# Patient Record
Sex: Female | Born: 1945 | Race: White | Hispanic: No | Marital: Married | Smoking: Never smoker
Health system: Southern US, Community
[De-identification: ages and names within clinical notes are randomized; demographics above are authoritative.]

## PROBLEM LIST (undated history)

## (undated) DIAGNOSIS — Z8719 Personal history of other diseases of the digestive system: Secondary | ICD-10-CM

## (undated) DIAGNOSIS — K589 Irritable bowel syndrome without diarrhea: Secondary | ICD-10-CM

## (undated) DIAGNOSIS — I10 Essential (primary) hypertension: Secondary | ICD-10-CM

## (undated) DIAGNOSIS — F329 Major depressive disorder, single episode, unspecified: Secondary | ICD-10-CM

## (undated) DIAGNOSIS — E785 Hyperlipidemia, unspecified: Secondary | ICD-10-CM

## (undated) DIAGNOSIS — M1990 Unspecified osteoarthritis, unspecified site: Secondary | ICD-10-CM

## (undated) DIAGNOSIS — N200 Calculus of kidney: Secondary | ICD-10-CM

## (undated) DIAGNOSIS — D649 Anemia, unspecified: Secondary | ICD-10-CM

## (undated) DIAGNOSIS — F419 Anxiety disorder, unspecified: Secondary | ICD-10-CM

## (undated) DIAGNOSIS — K219 Gastro-esophageal reflux disease without esophagitis: Secondary | ICD-10-CM

## (undated) DIAGNOSIS — E039 Hypothyroidism, unspecified: Secondary | ICD-10-CM

## (undated) DIAGNOSIS — Z87442 Personal history of urinary calculi: Secondary | ICD-10-CM

## (undated) DIAGNOSIS — F32A Anxiety disorder, unspecified: Secondary | ICD-10-CM

## (undated) DIAGNOSIS — M3500 Sicca syndrome, unspecified: Secondary | ICD-10-CM

## (undated) DIAGNOSIS — K5792 Diverticulitis of intestine, part unspecified, without perforation or abscess without bleeding: Secondary | ICD-10-CM

## (undated) DIAGNOSIS — M797 Fibromyalgia: Secondary | ICD-10-CM

## (undated) DIAGNOSIS — M329 Systemic lupus erythematosus, unspecified: Secondary | ICD-10-CM

## (undated) HISTORY — DX: Personal history of urinary calculi: Z87.442

## (undated) HISTORY — DX: Hypothyroidism, unspecified: E03.9

## (undated) HISTORY — DX: Calculus of kidney: N20.0

## (undated) HISTORY — PX: BREAST EXCISIONAL BIOPSY: SUR124

## (undated) HISTORY — DX: Diverticulitis of intestine, part unspecified, without perforation or abscess without bleeding: K57.92

## (undated) HISTORY — DX: Hyperlipidemia, unspecified: E78.5

## (undated) HISTORY — DX: Unspecified osteoarthritis, unspecified site: M19.90

## (undated) HISTORY — DX: Anemia, unspecified: D64.9

## (undated) HISTORY — DX: Fibromyalgia: M79.7

## (undated) HISTORY — DX: Irritable bowel syndrome, unspecified: K58.9

## (undated) HISTORY — PX: TONSILLECTOMY AND ADENOIDECTOMY: SUR1326

## (undated) HISTORY — DX: Anxiety disorder, unspecified: F41.9

## (undated) HISTORY — DX: Depression, unspecified: F32.A

## (undated) HISTORY — PX: CATARACT EXTRACTION: SUR2

## (undated) HISTORY — DX: Gastro-esophageal reflux disease without esophagitis: K21.9

## (undated) HISTORY — DX: Major depressive disorder, single episode, unspecified: F32.9

## (undated) HISTORY — DX: Systemic lupus erythematosus, unspecified: M32.9

## (undated) HISTORY — DX: Essential (primary) hypertension: I10

## (undated) HISTORY — DX: Personal history of other diseases of the digestive system: Z87.19

## (undated) HISTORY — DX: Anxiety disorder, unspecified: F32.A

## (undated) HISTORY — DX: Sjogren syndrome, unspecified: M35.00

---

## 1968-07-03 HISTORY — PX: APPENDECTOMY: SHX54

## 1986-07-03 HISTORY — PX: CHOLECYSTECTOMY: SHX55

## 1998-07-03 HISTORY — PX: TOTAL ABDOMINAL HYSTERECTOMY: SHX209

## 1999-01-26 ENCOUNTER — Ambulatory Visit (HOSPITAL_COMMUNITY): Admission: RE | Admit: 1999-01-26 | Discharge: 1999-01-26 | Payer: Self-pay | Admitting: Family Medicine

## 1999-01-26 ENCOUNTER — Encounter: Payer: Self-pay | Admitting: Family Medicine

## 1999-04-25 ENCOUNTER — Encounter (INDEPENDENT_AMBULATORY_CARE_PROVIDER_SITE_OTHER): Payer: Self-pay

## 1999-04-25 ENCOUNTER — Inpatient Hospital Stay (HOSPITAL_COMMUNITY): Admission: RE | Admit: 1999-04-25 | Discharge: 1999-04-27 | Payer: Self-pay | Admitting: Obstetrics and Gynecology

## 1999-06-04 ENCOUNTER — Encounter: Payer: Self-pay | Admitting: Emergency Medicine

## 1999-06-04 ENCOUNTER — Emergency Department (HOSPITAL_COMMUNITY): Admission: EM | Admit: 1999-06-04 | Discharge: 1999-06-04 | Payer: Self-pay | Admitting: Emergency Medicine

## 1999-07-04 DIAGNOSIS — M329 Systemic lupus erythematosus, unspecified: Secondary | ICD-10-CM

## 1999-07-04 HISTORY — DX: Systemic lupus erythematosus, unspecified: M32.9

## 2000-03-15 ENCOUNTER — Emergency Department (HOSPITAL_COMMUNITY): Admission: EM | Admit: 2000-03-15 | Discharge: 2000-03-15 | Payer: Self-pay | Admitting: Emergency Medicine

## 2000-03-22 ENCOUNTER — Encounter: Admission: RE | Admit: 2000-03-22 | Discharge: 2000-03-22 | Payer: Self-pay | Admitting: Obstetrics and Gynecology

## 2000-03-22 ENCOUNTER — Encounter: Payer: Self-pay | Admitting: Obstetrics and Gynecology

## 2001-03-28 ENCOUNTER — Encounter: Payer: Self-pay | Admitting: Obstetrics and Gynecology

## 2001-03-28 ENCOUNTER — Encounter: Admission: RE | Admit: 2001-03-28 | Discharge: 2001-03-28 | Payer: Self-pay | Admitting: Obstetrics and Gynecology

## 2002-03-31 ENCOUNTER — Encounter: Payer: Self-pay | Admitting: Obstetrics and Gynecology

## 2002-03-31 ENCOUNTER — Encounter: Admission: RE | Admit: 2002-03-31 | Discharge: 2002-03-31 | Payer: Self-pay | Admitting: Obstetrics and Gynecology

## 2002-09-20 ENCOUNTER — Emergency Department (HOSPITAL_COMMUNITY): Admission: EM | Admit: 2002-09-20 | Discharge: 2002-09-20 | Payer: Self-pay | Admitting: Emergency Medicine

## 2003-04-07 ENCOUNTER — Encounter: Payer: Self-pay | Admitting: Obstetrics and Gynecology

## 2003-04-07 ENCOUNTER — Encounter: Admission: RE | Admit: 2003-04-07 | Discharge: 2003-04-07 | Payer: Self-pay | Admitting: Obstetrics and Gynecology

## 2003-11-14 ENCOUNTER — Emergency Department (HOSPITAL_COMMUNITY): Admission: EM | Admit: 2003-11-14 | Discharge: 2003-11-14 | Payer: Self-pay

## 2003-11-14 IMAGING — CR DG ABDOMEN ACUTE W/ 1V CHEST
3 series · 3 of 3 positions shown · non-contrast
Comparison: Acute abdomen series ? [DATE].

CLINICAL DATA: Left-sided abdominal pain, nausea/vomiting. 
 ACUTE ABDOMEN SERIES (TWO VIEW ABDOMEN WITH ONE VIEW CHEST) ? [DATE]

[view not recorded (1 of 3)]
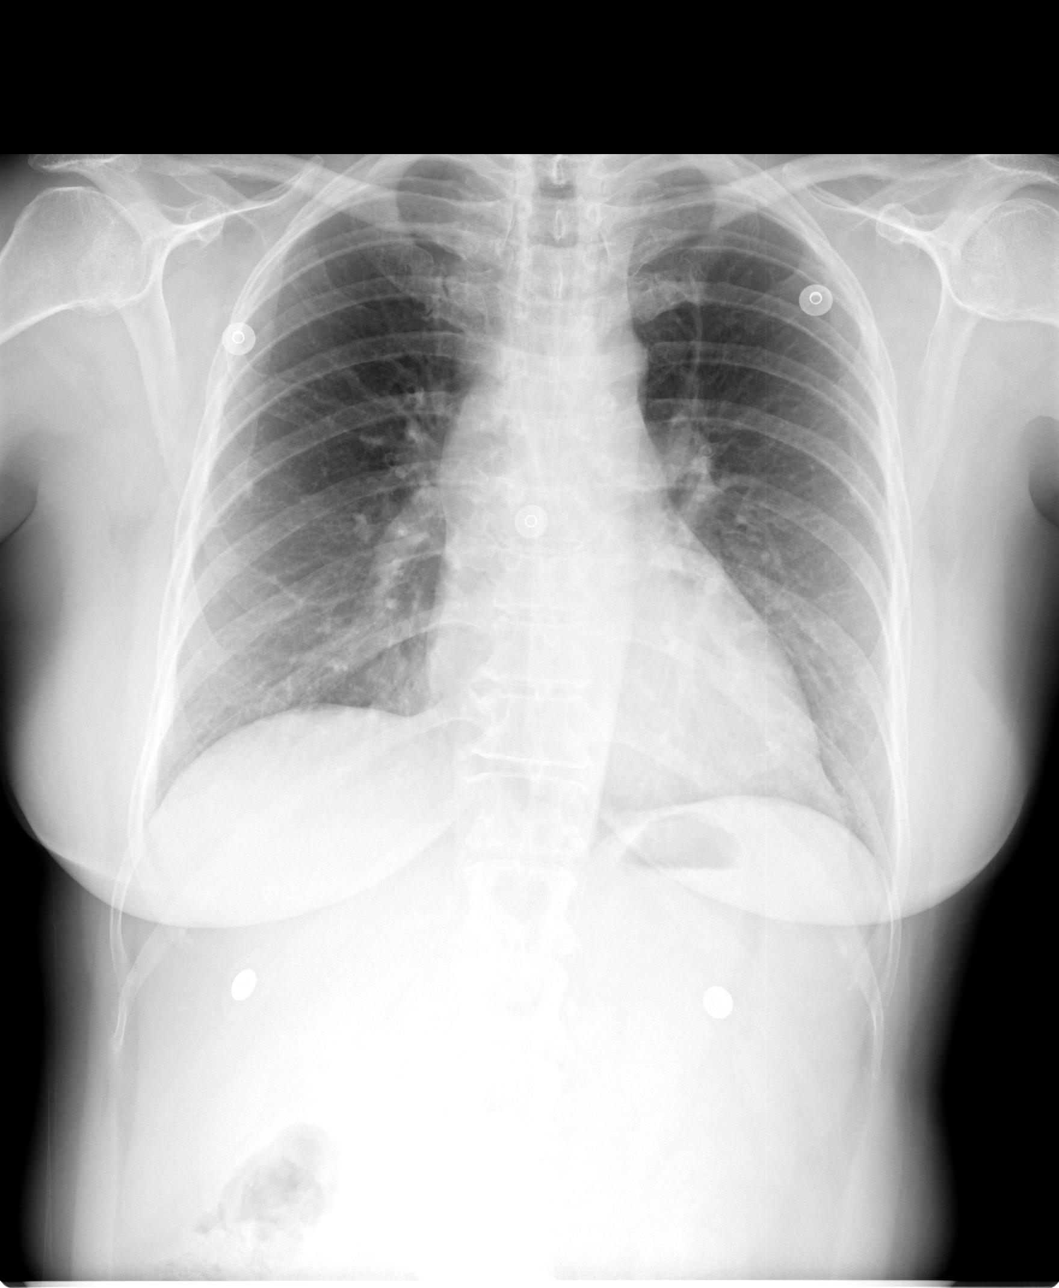

[view not recorded (2 of 3)]
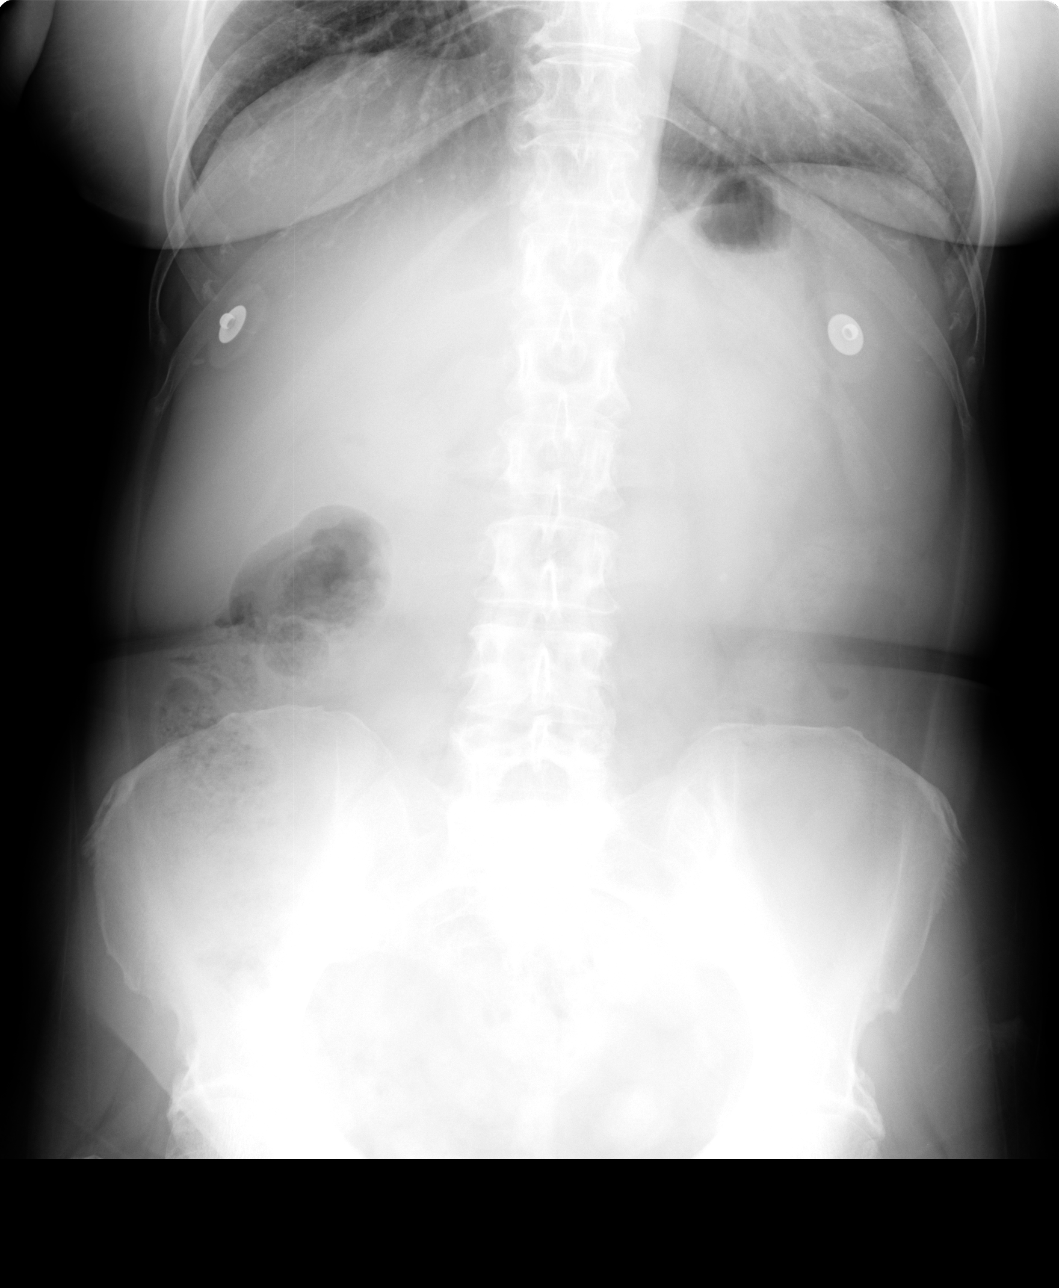

[view not recorded (3 of 3)]
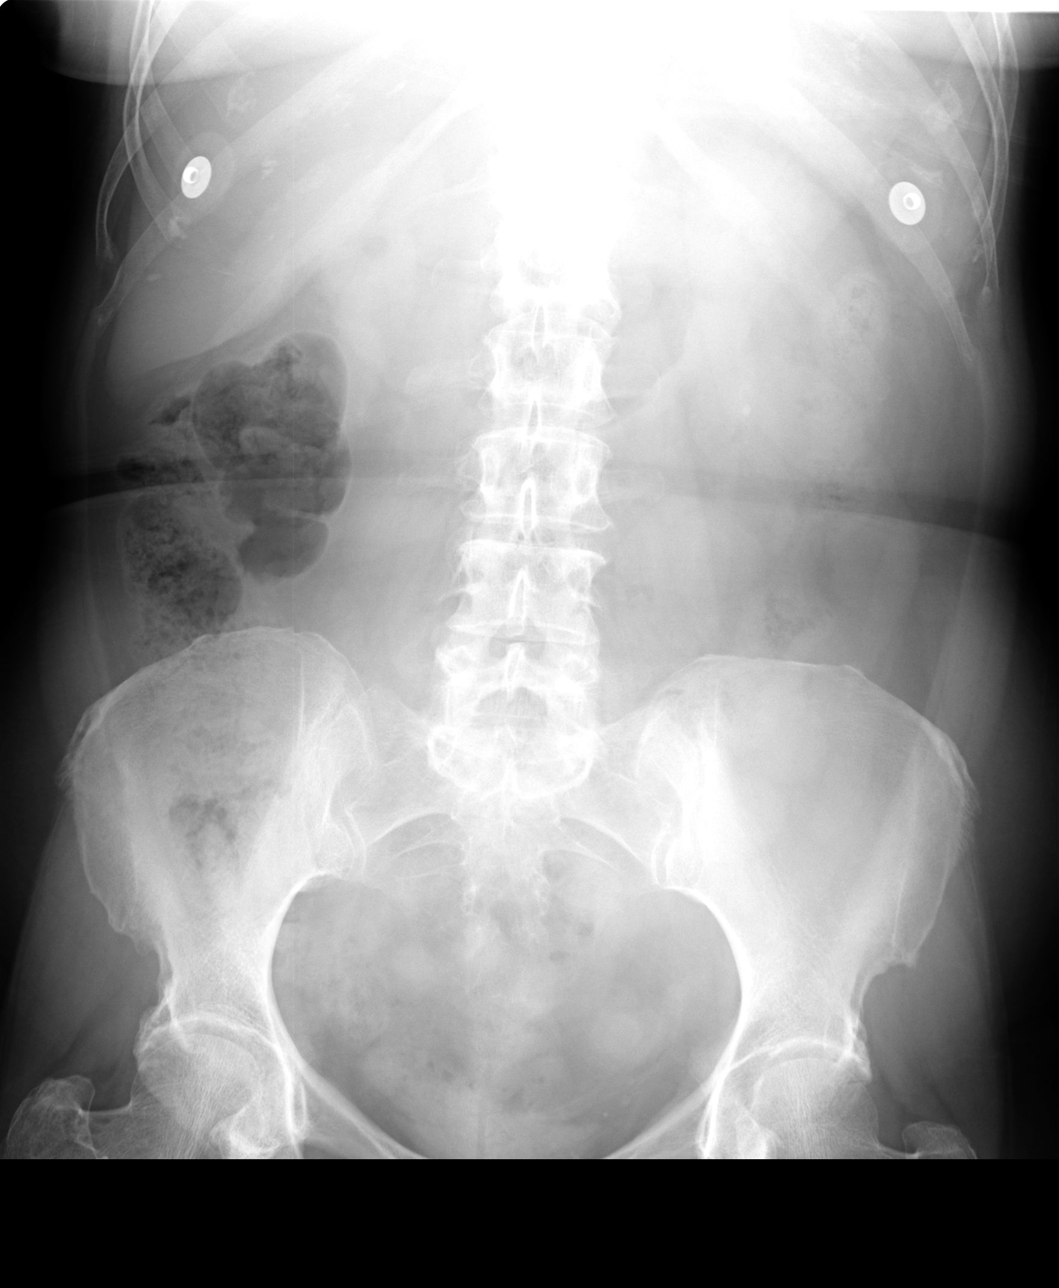

[3 of 3 positions shown; findings below may reference images not displayed]

The bowel gas pattern is unremarkable and there is no evidence of obstruction or significant ileus.  There is no evidence of free air on the erect view.  There is calcification which projects over the lower pole of the left kidney, not seen on the previous examination.  Phleboliths are again noted in the pelvis.  Mild degenerative changes are noted in the lower lumbar spine.  
 The accompanying PA chest demonstrates a normal heart size.  Thoracic aorta is mildly tortuous though unchanged.  The lungs appear clear. 
 IMPRESSION
 Query left lower pole renal calculus. 
 No acute abdominal abnormalities.  
 No acute cardiopulmonary disease.

## 2004-05-03 ENCOUNTER — Encounter: Admission: RE | Admit: 2004-05-03 | Discharge: 2004-05-03 | Payer: Self-pay | Admitting: Nephrology

## 2004-08-20 ENCOUNTER — Inpatient Hospital Stay (HOSPITAL_COMMUNITY): Admission: EM | Admit: 2004-08-20 | Discharge: 2004-08-22 | Payer: Self-pay

## 2005-03-01 ENCOUNTER — Encounter: Payer: Self-pay | Admitting: Pulmonary Disease

## 2005-03-02 ENCOUNTER — Ambulatory Visit (HOSPITAL_COMMUNITY): Admission: RE | Admit: 2005-03-02 | Discharge: 2005-03-02 | Payer: Self-pay | Admitting: Cardiology

## 2005-03-02 ENCOUNTER — Encounter: Payer: Self-pay | Admitting: Cardiology

## 2005-10-19 ENCOUNTER — Encounter: Admission: RE | Admit: 2005-10-19 | Discharge: 2005-10-19 | Payer: Self-pay | Admitting: Internal Medicine

## 2006-06-14 ENCOUNTER — Emergency Department (HOSPITAL_COMMUNITY): Admission: EM | Admit: 2006-06-14 | Discharge: 2006-06-14 | Payer: Self-pay | Admitting: Family Medicine

## 2006-11-12 ENCOUNTER — Encounter: Admission: RE | Admit: 2006-11-12 | Discharge: 2006-11-12 | Payer: Self-pay | Admitting: Internal Medicine

## 2007-11-13 ENCOUNTER — Encounter: Payer: Self-pay | Admitting: Pulmonary Disease

## 2007-12-13 ENCOUNTER — Ambulatory Visit: Payer: Self-pay | Admitting: Pulmonary Disease

## 2007-12-13 DIAGNOSIS — E0590 Thyrotoxicosis, unspecified without thyrotoxic crisis or storm: Secondary | ICD-10-CM | POA: Insufficient documentation

## 2007-12-13 DIAGNOSIS — J309 Allergic rhinitis, unspecified: Secondary | ICD-10-CM | POA: Insufficient documentation

## 2007-12-13 DIAGNOSIS — E7800 Pure hypercholesterolemia, unspecified: Secondary | ICD-10-CM | POA: Insufficient documentation

## 2007-12-13 DIAGNOSIS — R0602 Shortness of breath: Secondary | ICD-10-CM

## 2007-12-13 DIAGNOSIS — I10 Essential (primary) hypertension: Secondary | ICD-10-CM | POA: Insufficient documentation

## 2007-12-13 DIAGNOSIS — L930 Discoid lupus erythematosus: Secondary | ICD-10-CM

## 2008-01-16 ENCOUNTER — Ambulatory Visit: Payer: Self-pay | Admitting: Pulmonary Disease

## 2008-01-20 ENCOUNTER — Telehealth (INDEPENDENT_AMBULATORY_CARE_PROVIDER_SITE_OTHER): Payer: Self-pay | Admitting: *Deleted

## 2008-01-23 ENCOUNTER — Encounter: Admission: RE | Admit: 2008-01-23 | Discharge: 2008-01-23 | Payer: Self-pay | Admitting: Internal Medicine

## 2008-01-30 ENCOUNTER — Encounter: Admission: RE | Admit: 2008-01-30 | Discharge: 2008-01-30 | Payer: Self-pay | Admitting: Internal Medicine

## 2009-01-15 ENCOUNTER — Encounter (INDEPENDENT_AMBULATORY_CARE_PROVIDER_SITE_OTHER): Payer: Self-pay | Admitting: *Deleted

## 2009-08-27 ENCOUNTER — Telehealth: Payer: Self-pay | Admitting: Internal Medicine

## 2009-10-28 ENCOUNTER — Encounter: Admission: RE | Admit: 2009-10-28 | Discharge: 2009-10-28 | Payer: Self-pay | Admitting: Internal Medicine

## 2010-02-22 ENCOUNTER — Encounter
Admission: RE | Admit: 2010-02-22 | Discharge: 2010-02-22 | Payer: Self-pay | Source: Home / Self Care | Admitting: Internal Medicine

## 2010-07-23 ENCOUNTER — Other Ambulatory Visit: Payer: Self-pay | Admitting: Internal Medicine

## 2010-07-23 DIAGNOSIS — E041 Nontoxic single thyroid nodule: Secondary | ICD-10-CM

## 2010-08-02 ENCOUNTER — Ambulatory Visit (HOSPITAL_COMMUNITY)
Admission: RE | Admit: 2010-08-02 | Discharge: 2010-08-02 | Payer: Self-pay | Source: Home / Self Care | Attending: Internal Medicine | Admitting: Internal Medicine

## 2010-08-04 NOTE — Progress Notes (Signed)
Summary: Schedule Colonoscopy  Phone Note Outgoing Call Call back at Home Phone [phone]   Call placed by: Harlow Mares CMA Duncan Dull),  August 27, 2009 10:30 AM Call placed to: Patient Summary of Call: patient states that she knows that she is due for a colonoscopy but she was so much nasuea and vomiting from the prep the last time she does not wish to have the colonoscopy at this time but she will think about it and call back. I advised her that she would be able to do another prep and offered her an office visit to discuss her concerns with Dr. Marina Goodell. She declined.  Initial call taken by: Harlow Mares CMA Duncan Dull),  August 27, 2009 10:34 AM

## 2010-08-30 ENCOUNTER — Ambulatory Visit
Admission: RE | Admit: 2010-08-30 | Discharge: 2010-08-30 | Disposition: A | Discharge status: Home / Self Care | Source: Ambulatory Visit | Attending: Internal Medicine | Admitting: Internal Medicine

## 2010-08-30 DIAGNOSIS — E041 Nontoxic single thyroid nodule: Secondary | ICD-10-CM

## 2010-09-15 ENCOUNTER — Other Ambulatory Visit: Payer: Self-pay | Admitting: Internal Medicine

## 2010-09-15 DIAGNOSIS — Z1231 Encounter for screening mammogram for malignant neoplasm of breast: Secondary | ICD-10-CM

## 2010-09-23 ENCOUNTER — Ambulatory Visit

## 2010-10-04 ENCOUNTER — Ambulatory Visit
Admission: RE | Admit: 2010-10-04 | Discharge: 2010-10-04 | Disposition: A | Discharge status: Home / Self Care | Source: Ambulatory Visit | Attending: Internal Medicine | Admitting: Internal Medicine

## 2010-10-04 DIAGNOSIS — Z1231 Encounter for screening mammogram for malignant neoplasm of breast: Secondary | ICD-10-CM

## 2010-11-18 NOTE — Discharge Summary (Signed)
NAMEPAYTAN, RECINE               ACCOUNT NO.:  [id number]   MEDICAL RECORD NO.:  [id number]          PATIENT TYPE:  INP   LOCATION:  5506                         FACILITY:  [facility]   PHYSICIAN:  Kari Baars, M.D.  DATE OF BIRTH:  06/26/1946   DATE OF ADMISSION:  08/20/2004  DATE OF DISCHARGE:                                 DISCHARGE SUMMARY   DISCHARGE DIAGNOSES:  1.  Acute bronchitis/upper respiratory infection.  2.  Mild dehydration secondary to a #1.  3.  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  4.  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  5.  Hypertension.  6.  Iritis left side.  7.  Osteoporosis.  8.  Depression.  9.  Allergic rhinitis.  10. Status post cesarean section.  11. Status post cholecystectomy.  12. Status post appendectomy.  13. Status post cataract removal.  14. Status post tonsillectomy.  15. Glucose intolerance.   HOSPITAL PROCEDURES:  Chest x-ray (August 20, 2004) showing mild  peribronchial thickening with heart size upper limits of normal. No other  acute findings.   DISCHARGE MEDICATIONS:  1.  Avelox 400 mg daily be completed a seven-day course.  2.  Prednisone taper 20 mg daily x1 day, then 10 mg daily x2 days, then 5 mg      daily maintenance days until she follows up with Dr. Corliss Skains.  3.  Mucinex DM as needed for cough.  4.  Sudafed as needed for congestion.  5.  Actonel 35 mg weekly.  6.  Lexapro 10 mg daily.  7.  Zyrtec 10 mg daily.  8.  Imodium p.r.n. diarrhea.  9.  She was instructed to hold her Benicar until she has an office visit for      blood pressure follow-up.  10. Advair 100/50 b.i.d. .   HISTORY OF PRESENT ILLNESS:  For full details please see dictated history  and physical by Dr. Creola Corn.  Briefly, [name] is a 65 year old white  female with history of systemic lupus on prednisone who I saw in office  two  days prior to admission with symptoms consistent with acute bronchitis and  upper respiratory infection. Chest x-ray at that time showed no acute  cardiopulmonary disease. She was a bit tachycardiac. Therefore, she was  started on broad-spectrum antibiotics with Avelox as well as supportive  therapy with Advair, Mucinex, and Sudafed. She initially felt better but  over the next one to two days developed difficulty maintaining p.o. intake  with loss of appetite and increasing fatigue. She felt near syncopal and  presented to the emergency department where her  blood pressure was 77/40.  She was seen and admitted by my partner, Dr. Creola Corn for further  management.   HOSPITAL COURSE:  The patient was admitted to a medical bed. She was  continued on her broad-spectrum antibiotics. She was aggressively hydrated.  She was given stress dose steroids due to her hypotension in the setting of  chronic steroid  use. The urine and blood cultures were obtained and were  negative. Fortunately, she only had a mild increase in her baseline BUN and  creatinine to 22 and 1.4 on presentation. With hydration this improved to  her creatinine improved to 1.1 prior to discharge. Her steroids were weaned  on hospital day #2, down to p.o. prednisone which she has continued to  tolerate well. She has not had any further hypotension. Her tachycardia has  resolved. She is feeling better. At this time she is deemed stable for  discharge home with continued prednisone taper for relative adrenal  insufficiency in the setting of her acute bronchitis and upper respiratory  infection. She will continue her supportive therapy. She has had some  intermittent diarrhea since starting antibiotics. She has been encouraged to  maintain a large fluid intake. Of note, she does have glucose intolerance  and her sugar was up to 209 during this hospitalization after eating some  cake. She has already been instructed on glucose  monitoring and this will be  continued to be addressed as an outpatient.   DISCHARGE LABS:  Blood cultures were negative. CBC on August 21, 2004, was  significant for white count of 3.3, hemoglobin 10.4, platelets 165. CMP was  significant for a BUN of 16, creatinine 1.1. Her albumin is 3.2. Liver  function tests were normal. Her glucose was 151.   DISCHARGE DIET:  Increased fluids with recommendations for Diet 7-Up, Sprite  or water. Diabetic diet.   HOSPITAL FOLLOW-UP:  She will follow up with Dr. Clelia Croft on Thursday or Friday  for a blood pressure check. At that time we may restart her Benicar. She has  an appointment already with Dr. Corliss Skains this week to discuss tapering her  prednisone further.   DISPOSITION:  To home with her husband.      WS/MEDQ  D:  08/22/2004  T:  08/22/2004  Job:  [id number]   cc:   Pollyann Savoy, M.D.  [address]  [place], [place] [address]  Fax: [phone]   Terrial Rhodes, M.D.  [address]  [place]  [place] [address]  Fax: [phone]

## 2010-11-18 NOTE — H&P (Signed)
Veronica Jordan, Veronica Jordan               ACCOUNT NO.:  [id number]   MEDICAL RECORD NO.:  [id number]          PATIENT TYPE:  EMS   LOCATION:  MAJO                         FACILITY:  [facility]   PHYSICIAN:  Gwen Pounds, MD       DATE OF BIRTH:  02/23/1946   DATE OF ADMISSION:  08/20/2004  DATE OF DISCHARGE:                                HISTORY & PHYSICAL   PRIMARY CARE PHYSICIAN:  Dr. Clelia Croft   RHEUMATOLOGIST:  Grandville Silos. Corliss Skains, M.D.   NEPHROLOGIST:  Terrial Rhodes, M.D.   CHIEF COMPLAINT:  Hypertension, failure to thrive.   HISTORY OF PRESENT ILLNESS:  A 65 year old female with systemic lupus,  recently off her Plaquenil, and is started back on low dose prednisone per  Dr. Fatima Sanger recommendation. She has been on prednisone since November  and she has been pretty much on this drug for many years and had a short  term off. She saw Dr. Clelia Croft on Thursday with cough, congestion, no wheezing,  but short of breath, and was diagnosed with an upper respiratory tract  infection.  I presume because of her chronic poor health was put on  antibiotics prophylactically. She was treated with Avelox, Advair, Mucinex,  and Sudafed. She took her medications Thursday and felt better, but over the  next one or two days she has become progressively worse in terms of  anorexia, decreasing p.o., decreasing fluids, worsening dehydration,  increase of a weird feeling that she describes, increasing weakness,  increased fatigue, and tired. Some presyncopal type feelings. They called me  this afternoon and I told them to come on to the emergency room. In the ED  she was found to have a blood pressure of 77/40 and will be requiring  admission.   PAST MEDICAL HISTORY:  1.  Systemic lupus.  2.  Osteoporosis.  3.  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 Arrie Aran.  4.  Hypertension.  5.  Anxiety, depression.  6.  History of left eye iritis.  7.  History of  chronic prednisone use.  8.  Allergic rhinitis.  9.  Total abdominal hysterectomy.  10. Bilateral salpingo-oophorectomy.  11. Two Cesarean sections.  12. Tonsillectomy and adenoidectomy.  13. Cholecystectomy.  14. Appendectomy.  15. Two cataract surgeries.  16. History of negative breast biopsy.   ALLERGIES:  PENICILLIN, SULFA, E-MYCIN, DEMEROL, and ORAL CONTRAST.   MEDICATIONS:  1.  Prednisone 5 mg daily.  2.  Benicar 20 daily.  3.  Zyrtec 10 q.a.m.  4.  She is currently off Plaquenil for the last two or three weeks.  5.  She is on Actonel 35 weekly.  6.  Lexapro 10 daily.   SOCIAL HISTORY:  She is married with three children; unfortunately two  passed away, one at birth and one of a motor vehicle accident. No tobacco or  alcohol. She works at [employer].   FAMILY HISTORY:  Mother is alive at 65 and has COPD. Father died of  congestive heart failure and some complications of gallbladder surgery.  REVIEW OF SYSTEMS:  Her cough is better. History of present illness has  multiple review of systems and she is currently not nauseated or vomiting,  and all other review of systems are negative.   PHYSICAL EXAMINATION:  VITAL SIGNS: Blood pressure between 77/40 and 96/56,  heart rate 83, respiratory rate 13, saturation fine.  GENERAL: Alert and oriented times three, appears ill.  HEENT: PERRL. EOM. Oropharynx is dry.  NECK: No JVD.  PULMONARY: Clear to auscultation bilaterally.  CARDIAC: Regular.  ABDOMEN: Soft.  EXTREMITIES: No edema, but she is weak, and able to move all fours.   ASSESSMENT:  This is a hypertensive, weak, failure to thrive, dehydrated  female with an underlying diagnosis of systemic lupus on steroids who is  probably adrenally insufficient as well.   PLAN:  1.  Admit to telemetry for close evaluation and with hypotension.  2.  Aggressive IV fluids.  3.  Follow up on BUN and creatinine, and follow for renal insufficiency.  4.  Give stress dose steroids.   5.  Finish treatment for upper respiratory tract infection.  6.  Check chest x-ray.  7.  Check bloodwork.  8.  Check CBGs on higher dose steroids to see if she needs insulin sliding      scale.  9.  Will follow labs.  10. If the above treatment do not get her better or do not get  her straight      back, may need to do physical and occupational therapy.     JMR/MEDQ  D:  08/20/2004  T:  08/21/2004  Job:  [id number]

## 2010-12-23 ENCOUNTER — Ambulatory Visit: Admitting: Internal Medicine

## 2011-01-27 ENCOUNTER — Encounter: Payer: Self-pay | Admitting: *Deleted

## 2011-01-30 ENCOUNTER — Ambulatory Visit: Admitting: Internal Medicine

## 2011-01-31 ENCOUNTER — Encounter: Payer: Self-pay | Admitting: Internal Medicine

## 2011-02-06 ENCOUNTER — Encounter: Payer: Self-pay | Admitting: Internal Medicine

## 2011-03-07 ENCOUNTER — Encounter: Payer: Self-pay | Admitting: Internal Medicine

## 2011-03-07 ENCOUNTER — Ambulatory Visit (INDEPENDENT_AMBULATORY_CARE_PROVIDER_SITE_OTHER): Admitting: Internal Medicine

## 2011-03-07 VITALS — BP 108/52 | HR 84 | Ht 58.25 in | Wt 136.0 lb

## 2011-03-07 DIAGNOSIS — Z1211 Encounter for screening for malignant neoplasm of colon: Secondary | ICD-10-CM

## 2011-03-07 DIAGNOSIS — R198 Other specified symptoms and signs involving the digestive system and abdomen: Secondary | ICD-10-CM

## 2011-03-07 DIAGNOSIS — R110 Nausea: Secondary | ICD-10-CM

## 2011-03-07 DIAGNOSIS — K589 Irritable bowel syndrome without diarrhea: Secondary | ICD-10-CM

## 2011-03-07 MED ORDER — PEG-KCL-NACL-NASULF-NA ASC-C 100 G PO SOLR: 1.0000 | Freq: Once | ORAL | Status: DC

## 2011-03-07 NOTE — Patient Instructions (Signed)
Colon [facility] 03/17/11 8:30 am Moviprep sent to your pharmacy Colon brochure given for you to review. Reglan for nausea has been sent to pharmacy take 1 20 minutes prior to drinking prep.

## 2011-03-07 NOTE — Progress Notes (Signed)
HISTORY OF PRESENT ILLNESS:  Veronica Jordan is a 65 y.o. female with multiple medical problems as listed below. She presents today regarding followup screening colonoscopy. She was last seen in 2005 she was evaluated for rectal bleeding and chronic abdominal complaints consistent with irritable bowel syndrome. Colonoscopy and upper endoscopy were performed. Colonoscopy revealed mild diverticulosis. The examination was otherwise normal including intubation of the ileum. Random colon biopsies were normal. Upper endoscopy was normal, including duodenal biopsies to rule out sprue. She is now due for routine followup screening. She continues with chronic abdominal complaints as manifested by belching, bloating, gas, and intermittent postprandial urgency. No bleeding. Some GERD symptoms. She tells me that she had difficulties with her last colonoscopy prep, which was MiraLax. No interval family history of colon cancer. She also requires about celiac disease. Again, she had negative duodenal biopsies previously. REVIEW OF SYSTEMS:  All non-GI ROS negative except for sinus and allergy trouble, anxiety, osteoarthritis, back pain, optic neuropathy, depression, fatigue, shortness of breath, ankle edema.  Past Medical History  Diagnosis Date  . Allergic rhinitis   . Diabetes mellitus   . Hyperlipemia   . Hypertension   . Systemic lupus 2001  . GERD (gastroesophageal reflux disease)   . Osteoporosis   . Hypothyroidism   . Sjogren's syndrome   . Anxiety and depression   . Anemia   . Osteoarthritis   . Fibromyalgia   . History of gallstones   . IBS (irritable bowel syndrome)   . History of kidney stones     Past Surgical History  Procedure Date  . Total abdominal hysterectomy 2000  . Cholecystectomy 1988  . Breast biopsy   . Appendectomy 1970  . Cesarean section 1976, 1977    x2  . Cataract extraction     bilateral    Social History MELAYA HOSELTON  reports that she has never smoked. She  has never used smokeless tobacco. She reports that she does not drink alcohol or use illicit drugs.  family history includes Asthma in her mother and sister; COPD in her mother; Heart disease in her father; and Parkinsonism in an unspecified family member.  There is no history of Colon cancer.  Allergies  Allergen Reactions  . Erythromycin   . Meperidine Hcl   . Ondansetron Hcl   . Penicillins   . Sulfonamide Derivatives        PHYSICAL EXAMINATION: Vital signs: BP 108/52  Pulse 84  Ht 4' 10.25" (1.48 m)  Wt 136 lb (61.689 kg)  BMI 28.18 kg/m2  Constitutional: generally well-appearing, no acute distress Psychiatric: alert and oriented x3, cooperative Eyes: extraocular movements intact, anicteric, conjunctiva pink Mouth: oral pharynx moist, no lesions Neck: supple no lymphadenopathy Cardiovascular: heart regular rate and rhythm, no murmur Lungs: clear to auscultation bilaterally Abdomen: soft, nontender, nondistended, no obvious ascites, no peritoneal signs, normal bowel sounds, no organomegaly Rectal: Deferred until colonoscopy Extremities: no lower extremity edema bilaterally Skin: no lesions on visible extremities Neuro: No focal deficits.   ASSESSMENT:  #1. Screening colonoscopy. Appropriate candidate without contraindication.The nature of the procedure, as well as the risks, benefits, and alternatives were carefully and thoroughly reviewed with the patient. Ample time for discussion and questions allowed. The patient understood, was satisfied, and agreed to proceed.  #2. Movi prep prescribed. The patient instructed to use hard candy while consuming the prep to improve tolerance #3. Metoclopramide 10 mg 20 minutes before starting each prep session to help with nausea #4. IBS. Ongoing

## 2011-03-17 ENCOUNTER — Encounter: Payer: Self-pay | Admitting: Internal Medicine

## 2011-03-17 ENCOUNTER — Ambulatory Visit (AMBULATORY_SURGERY_CENTER): Admitting: Internal Medicine

## 2011-03-17 DIAGNOSIS — R198 Other specified symptoms and signs involving the digestive system and abdomen: Secondary | ICD-10-CM

## 2011-03-17 DIAGNOSIS — R110 Nausea: Secondary | ICD-10-CM

## 2011-03-17 DIAGNOSIS — K589 Irritable bowel syndrome without diarrhea: Secondary | ICD-10-CM

## 2011-03-17 DIAGNOSIS — Z1211 Encounter for screening for malignant neoplasm of colon: Secondary | ICD-10-CM

## 2011-03-17 MED ORDER — SODIUM CHLORIDE 0.9 % IV SOLN: 500.0000 mL | INTRAVENOUS | Status: DC

## 2011-03-17 NOTE — Patient Instructions (Signed)
Please read the handouts given to you by your recovery room nurse.    Increase the fiber in your diet due to your diverticulosis.   Resume your routine medications today.  IF you have any questions or concerns, please call us at [phone].   Thank-you cor choosing [place] today for your healthcare needs.

## 2011-03-20 ENCOUNTER — Telehealth: Payer: Self-pay | Admitting: *Deleted

## 2011-03-20 NOTE — Telephone Encounter (Signed)

## 2011-08-09 ENCOUNTER — Encounter (HOSPITAL_COMMUNITY): Payer: Self-pay

## 2011-08-09 ENCOUNTER — Encounter (HOSPITAL_COMMUNITY)
Admission: RE | Admit: 2011-08-09 | Discharge: 2011-08-09 | Disposition: A | Discharge status: Home / Self Care | Source: Ambulatory Visit | Attending: Internal Medicine | Admitting: Internal Medicine

## 2011-08-09 DIAGNOSIS — M810 Age-related osteoporosis without current pathological fracture: Secondary | ICD-10-CM | POA: Insufficient documentation

## 2011-08-09 MED ORDER — ZOLEDRONIC ACID 5 MG/100ML IV SOLN
5.0000 mg | Freq: Once | INTRAVENOUS | Status: AC
  Administered 2011-08-09: 5 mg via INTRAVENOUS
  Filled 2011-08-09: qty 100

## 2011-08-09 MED ORDER — SODIUM CHLORIDE 0.9 % IV SOLN
INTRAVENOUS | Status: DC
  Administered 2011-08-09: 16:00:00 via INTRAVENOUS

## 2011-09-19 ENCOUNTER — Other Ambulatory Visit: Payer: Self-pay | Admitting: *Deleted

## 2011-09-19 ENCOUNTER — Other Ambulatory Visit: Payer: Self-pay | Admitting: Internal Medicine

## 2011-09-19 DIAGNOSIS — Z1231 Encounter for screening mammogram for malignant neoplasm of breast: Secondary | ICD-10-CM

## 2011-10-20 ENCOUNTER — Ambulatory Visit

## 2011-11-09 ENCOUNTER — Ambulatory Visit
Admission: RE | Admit: 2011-11-09 | Discharge: 2011-11-09 | Disposition: A | Discharge status: Home / Self Care | Source: Ambulatory Visit | Attending: Internal Medicine | Admitting: Internal Medicine

## 2011-11-09 DIAGNOSIS — Z1231 Encounter for screening mammogram for malignant neoplasm of breast: Secondary | ICD-10-CM

## 2012-01-13 ENCOUNTER — Emergency Department (INDEPENDENT_AMBULATORY_CARE_PROVIDER_SITE_OTHER)
Admission: EM | Admit: 2012-01-13 | Discharge: 2012-01-13 | Disposition: A | Discharge status: Home / Self Care | Source: Home / Self Care | Attending: Emergency Medicine | Admitting: Emergency Medicine

## 2012-01-13 ENCOUNTER — Encounter (HOSPITAL_COMMUNITY): Payer: Self-pay

## 2012-01-13 DIAGNOSIS — L259 Unspecified contact dermatitis, unspecified cause: Secondary | ICD-10-CM

## 2012-01-13 DIAGNOSIS — L309 Dermatitis, unspecified: Secondary | ICD-10-CM

## 2012-01-13 DIAGNOSIS — K130 Diseases of lips: Secondary | ICD-10-CM

## 2012-01-13 MED ORDER — BACITRACIN-PRAMOXINE HCL 500-10 UNIT-MG/GM EX OINT: 1.0000 "application " | TOPICAL_OINTMENT | Freq: Two times a day (BID) | CUTANEOUS | Status: DC

## 2012-01-13 NOTE — ED Notes (Signed)
Pt states she has problems with dry lips and mouth and her lower lip has a small spot that is bleeding, started two hrs ago.

## 2012-01-13 NOTE — ED Provider Notes (Signed)
History     CSN: [id number]  Arrival date & time 01/13/12  1807   First MD Initiated Contact with Patient 01/13/12 1824      Chief Complaint  Patient presents with  . Bleeding/Bruising    (Consider location/radiation/quality/duration/timing/severity/associated sxs/prior treatment) HPI Comments: Patient reports history of cracked, dry lips that she has a habit of licking them, and peeling off the dead skin. States she peeled off some dead skin earlier today, and had some with bleeding that did not stop for several hours. Applied direct pressure with eventual hemostasis. States she tries using lip balm for her dry lips, but has not been using any recently. Is taking a baby aspirin once daily, is not on any other antiplatelet agents or anticoagulants. Denies nausea, vomiting, fevers, other rash. Has a history of Sjoren's syndrome, lupus. She was on chronic steroids, but has stopped taking in the past several days.  ROS as noted in HPI. All other ROS negative.   Patient is a 66 y.o. female presenting with mouth sores. The history is provided by the patient. No language interpreter was used.  Mouth Lesions  The current episode started today. The onset was sudden. Associated symptoms include mouth sores.    Past Medical History  Diagnosis Date  . Allergic rhinitis   . Diabetes mellitus   . Hyperlipemia   . Hypertension   . Systemic lupus 2001  . GERD (gastroesophageal reflux disease)   . Osteoporosis   . Hypothyroidism   . Sjogren's syndrome   . Anxiety and depression   . Anemia   . Osteoarthritis   . Fibromyalgia   . History of gallstones   . IBS (irritable bowel syndrome)   . History of kidney stones     Past Surgical History  Procedure Date  . Total abdominal hysterectomy 2000  . Cholecystectomy 1988  . Breast biopsy   . Appendectomy 1970  . Cesarean section 1976, 1977    x2  . Cataract extraction     bilateral    Family History  Problem Relation Age of Onset    . Asthma Mother   . COPD Mother   . Asthma Sister   . Heart disease Father   . Parkinsonism      MGM  . Colon cancer Neg Hx   . Esophageal cancer Neg Hx   . Stomach cancer Neg Hx     History  Substance Use Topics  . Smoking status: Never Smoker   . Smokeless tobacco: Never Used  . Alcohol Use: No    OB History    Grav Para Term Preterm Abortions TAB SAB Ect Mult Living                  Review of Systems  HENT: Positive for mouth sores.     Allergies  Codeine; Erythromycin; Meperidine hcl; Ondansetron hcl; Penicillins; and Sulfonamide derivatives  Home Medications   Current Outpatient Rx  Name Route Sig Dispense Refill  . AMLODIPINE BESYLATE 10 MG PO TABS Oral Take 10 mg by mouth daily.      . ASPIRIN 81 MG PO TABS Oral Take 81 mg by mouth daily.      Marland Kitchen BACITRACIN-PRAMOXINE HCL 500-10 UNIT-MG/GM EX OINT Apply externally Apply 1 application topically 2 (two) times daily. 28 g 0  . CALCIUM CARBONATE 600 MG PO TABS Oral Take 600 mg by mouth 2 (two) times daily with a meal.      . CHOLECALCIFEROL 1000 UNITS PO TABS Oral  Take 1,000 Units by mouth daily.      Marland Kitchen CITALOPRAM HYDROBROMIDE 20 MG PO TABS Oral Take 20 mg by mouth daily.      . COQ-10 50 MG PO CAPS Oral Take 1 capsule by mouth daily.      . CYCLOSPORINE 0.05 % OP EMUL  1 drop daily.      Marland Kitchen DHEA 50 MG PO CAPS Oral Take 1 capsule by mouth daily.      Marland Kitchen HYDROXYCHLOROQUINE SULFATE 200 MG PO TABS Oral Take 200 mg by mouth 2 (two) times daily.      Marland Kitchen LEVOTHYROXINE SODIUM 25 MCG PO TABS Oral Take 25 mcg by mouth daily.      Marland Kitchen LORATADINE 10 MG PO TABS Oral Take 10 mg by mouth daily.      Marland Kitchen LOSARTAN POTASSIUM 50 MG PO TABS Oral Take 100 mg by mouth daily.     Marland Kitchen SIMVASTATIN 40 MG PO TABS Oral Take 20 mg by mouth at bedtime.     Marland Kitchen ZOLEDRONIC ACID 5 MG/100ML IV SOLN Intravenous Inject 5 mg into the vein once. Every year       BP 151/75  Pulse 82  Temp 98.6 F (37 C) (Oral)  Resp 20  SpO2 98%  Physical Exam  Nursing  note and vitals reviewed. Constitutional: She is oriented to person, place, and time. She appears well-developed and well-nourished. No distress.  HENT:  Head: Normocephalic and atraumatic.       Dry, cracked lips with dead skin. Several fissures present. No active bleeding noted.  Eyes: Conjunctivae and EOM are normal.  Neck: Normal range of motion.  Cardiovascular: Normal rate.   Pulmonary/Chest: Effort normal.  Abdominal: She exhibits no distension.  Musculoskeletal: Normal range of motion.  Neurological: She is alert and oriented to person, place, and time. Coordination normal.  Skin: Skin is warm and dry.  Psychiatric: She has a normal mood and affect. Her behavior is normal. Judgment and thought content normal.    ED Course  Procedures (including critical care time)  Labs Reviewed - No data to display No results found.   1. Lip licking dermatitis   2. Lip fissure       MDM  Will have her restart a flavored lip balm to help her stop licking her lips. Advised lip balm with sunscreen. Also will have her try some heavy lip gloss. Will send home with bacitracin/pyridoxine ointment. She will followup with her primary care physician as needed.  Luiz Blare, MD 01/13/12 2100

## 2012-07-09 DIAGNOSIS — L930 Discoid lupus erythematosus: Secondary | ICD-10-CM | POA: Diagnosis not present

## 2012-07-09 DIAGNOSIS — H538 Other visual disturbances: Secondary | ICD-10-CM | POA: Diagnosis not present

## 2012-07-09 DIAGNOSIS — H468 Other optic neuritis: Secondary | ICD-10-CM | POA: Diagnosis not present

## 2012-07-09 DIAGNOSIS — Z961 Presence of intraocular lens: Secondary | ICD-10-CM | POA: Diagnosis not present

## 2012-07-24 DIAGNOSIS — M810 Age-related osteoporosis without current pathological fracture: Secondary | ICD-10-CM | POA: Diagnosis not present

## 2012-07-24 DIAGNOSIS — Z79899 Other long term (current) drug therapy: Secondary | ICD-10-CM | POA: Diagnosis not present

## 2012-07-24 DIAGNOSIS — K219 Gastro-esophageal reflux disease without esophagitis: Secondary | ICD-10-CM | POA: Diagnosis not present

## 2012-08-08 DIAGNOSIS — D509 Iron deficiency anemia, unspecified: Secondary | ICD-10-CM | POA: Diagnosis not present

## 2012-08-08 DIAGNOSIS — N2581 Secondary hyperparathyroidism of renal origin: Secondary | ICD-10-CM | POA: Diagnosis not present

## 2012-08-08 DIAGNOSIS — M329 Systemic lupus erythematosus, unspecified: Secondary | ICD-10-CM | POA: Diagnosis not present

## 2012-08-08 DIAGNOSIS — N181 Chronic kidney disease, stage 1: Secondary | ICD-10-CM | POA: Diagnosis not present

## 2012-08-19 ENCOUNTER — Other Ambulatory Visit (HOSPITAL_COMMUNITY): Payer: Self-pay | Admitting: Internal Medicine

## 2012-08-21 ENCOUNTER — Ambulatory Visit (HOSPITAL_COMMUNITY)
Admission: RE | Admit: 2012-08-21 | Discharge: 2012-08-21 | Disposition: A | Discharge status: Home / Self Care | Payer: Medicare Other | Source: Ambulatory Visit | Attending: Internal Medicine | Admitting: Internal Medicine

## 2012-08-21 ENCOUNTER — Encounter (HOSPITAL_COMMUNITY): Payer: Self-pay

## 2012-08-21 DIAGNOSIS — M810 Age-related osteoporosis without current pathological fracture: Secondary | ICD-10-CM | POA: Insufficient documentation

## 2012-08-21 MED ORDER — SODIUM CHLORIDE 0.9 % IV SOLN
INTRAVENOUS | Status: AC
  Administered 2012-08-21: 15:00:00 via INTRAVENOUS

## 2012-08-21 MED ORDER — ZOLEDRONIC ACID 5 MG/100ML IV SOLN
5.0000 mg | Freq: Once | INTRAVENOUS | Status: AC
  Administered 2012-08-21: 5 mg via INTRAVENOUS
  Filled 2012-08-21: qty 100

## 2012-08-22 DIAGNOSIS — R300 Dysuria: Secondary | ICD-10-CM | POA: Diagnosis not present

## 2012-08-22 DIAGNOSIS — Z79899 Other long term (current) drug therapy: Secondary | ICD-10-CM | POA: Diagnosis not present

## 2012-08-27 DIAGNOSIS — M25549 Pain in joints of unspecified hand: Secondary | ICD-10-CM | POA: Diagnosis not present

## 2012-08-27 DIAGNOSIS — IMO0001 Reserved for inherently not codable concepts without codable children: Secondary | ICD-10-CM | POA: Diagnosis not present

## 2012-08-27 DIAGNOSIS — M329 Systemic lupus erythematosus, unspecified: Secondary | ICD-10-CM | POA: Diagnosis not present

## 2012-08-27 DIAGNOSIS — Z09 Encounter for follow-up examination after completed treatment for conditions other than malignant neoplasm: Secondary | ICD-10-CM | POA: Diagnosis not present

## 2012-10-07 DIAGNOSIS — M329 Systemic lupus erythematosus, unspecified: Secondary | ICD-10-CM | POA: Diagnosis not present

## 2012-10-07 DIAGNOSIS — N182 Chronic kidney disease, stage 2 (mild): Secondary | ICD-10-CM | POA: Diagnosis not present

## 2012-10-07 DIAGNOSIS — I10 Essential (primary) hypertension: Secondary | ICD-10-CM | POA: Diagnosis not present

## 2012-10-07 DIAGNOSIS — E039 Hypothyroidism, unspecified: Secondary | ICD-10-CM | POA: Diagnosis not present

## 2012-10-07 DIAGNOSIS — E119 Type 2 diabetes mellitus without complications: Secondary | ICD-10-CM | POA: Diagnosis not present

## 2012-10-07 DIAGNOSIS — N390 Urinary tract infection, site not specified: Secondary | ICD-10-CM | POA: Diagnosis not present

## 2012-10-14 DIAGNOSIS — Z6825 Body mass index (BMI) 25.0-25.9, adult: Secondary | ICD-10-CM | POA: Diagnosis not present

## 2012-10-14 DIAGNOSIS — I10 Essential (primary) hypertension: Secondary | ICD-10-CM | POA: Diagnosis not present

## 2012-10-14 DIAGNOSIS — R05 Cough: Secondary | ICD-10-CM | POA: Diagnosis not present

## 2012-10-14 DIAGNOSIS — E119 Type 2 diabetes mellitus without complications: Secondary | ICD-10-CM | POA: Diagnosis not present

## 2012-10-14 DIAGNOSIS — J069 Acute upper respiratory infection, unspecified: Secondary | ICD-10-CM | POA: Diagnosis not present

## 2012-10-23 ENCOUNTER — Other Ambulatory Visit: Payer: Self-pay

## 2012-10-23 ENCOUNTER — Other Ambulatory Visit: Payer: Self-pay | Admitting: Internal Medicine

## 2012-10-23 DIAGNOSIS — Z1231 Encounter for screening mammogram for malignant neoplasm of breast: Secondary | ICD-10-CM

## 2012-10-31 ENCOUNTER — Ambulatory Visit: Payer: Self-pay | Admitting: Nurse Practitioner

## 2012-11-05 ENCOUNTER — Encounter: Payer: Self-pay | Admitting: Nurse Practitioner

## 2012-11-06 ENCOUNTER — Encounter: Payer: Self-pay | Admitting: Nurse Practitioner

## 2012-11-06 ENCOUNTER — Ambulatory Visit (INDEPENDENT_AMBULATORY_CARE_PROVIDER_SITE_OTHER): Payer: Medicare Other | Admitting: Nurse Practitioner

## 2012-11-06 VITALS — BP 102/48 | HR 62 | Ht 59.5 in | Wt 125.4 lb

## 2012-11-06 DIAGNOSIS — Z01419 Encounter for gynecological examination (general) (routine) without abnormal findings: Secondary | ICD-10-CM | POA: Diagnosis not present

## 2012-11-06 DIAGNOSIS — Z124 Encounter for screening for malignant neoplasm of cervix: Secondary | ICD-10-CM

## 2012-11-06 MED ORDER — ESTRADIOL 10 MCG VA TABS: 10.0000 ug | ORAL_TABLET | VAGINAL | Status: DC

## 2012-11-06 NOTE — Progress Notes (Signed)
67 y.o. G67P2001 Married Caucasian Fe here for annual exam.  Had UTI in April and took antibiotics, she is schedule for a repeat UA at PCP in 1 week.  Currently some itching external vulva that she has used OTC antifungal and maybe some better now.  She uses Vagifem 1-2 times per week. UTI is not to sexual activity.   Husband is now retired for 2 weeks.  They have been on vacation and he is now gone fishing with friends.  No LMP recorded. Patient has had a hysterectomy.          Sexually active: no  The current method of family planning is post menopausal status.    Exercising: no  The patient does not participate in regular exercise at present. Smoker:  no  Health Maintenance: Pap:  Not indicated since hysterectomy in 2000 MMG:  09/2011 scheduled for next week Colonoscopy:  12/2010 normal recheck in 10 years BMD:   05/2012 Reclast for 3 years in Jan 2014 TDaP:  Up to date, per pt.  Labs: PCP does UA and all blood work. - Dr. Martha Clan   reports that she has never smoked. She has never used smokeless tobacco. She reports that she does not drink alcohol or use illicit drugs.  Past Medical History  Diagnosis Date  . Allergic rhinitis   . Diabetes mellitus   . Hyperlipemia   . Hypertension   . Systemic lupus 2001  . GERD (gastroesophageal reflux disease)   . Osteoporosis   . Sjogren's syndrome   . Anxiety and depression   . Anemia   . Osteoarthritis   . Fibromyalgia   . History of gallstones   . IBS (irritable bowel syndrome)   . History of kidney stones   . Diverticulitis   . Renal calculi   . Hypothyroidism   . Depression     Past Surgical History  Procedure Laterality Date  . Cholecystectomy  1988  . Breast biopsy    . Appendectomy  1970  . Cesarean section  1976, 1977    x2  . Cataract extraction      bilateral  . Total abdominal hysterectomy  2000    BSO due to fibroids    Current Outpatient Prescriptions  Medication Sig Dispense Refill  . amLODipine  (NORVASC) 10 MG tablet Take 10 mg by mouth daily.        Marland Kitchen aspirin 81 MG tablet Take 81 mg by mouth daily.        . Bacitracin-Pramoxine HCl 500-10 UNIT-MG/GM OINT Apply 1 application topically 2 (two) times daily.  28 g  0  . calcium carbonate (OS-CAL) 600 MG TABS Take 600 mg by mouth 2 (two) times daily with a meal.        . Cholecalciferol (VITAMIN D-1000 MAX ST) 1000 UNITS tablet Take 1,000 Units by mouth daily.        . citalopram (CELEXA) 20 MG tablet Take 20 mg by mouth daily.        . Coenzyme Q10 (COQ-10) 50 MG CAPS Take 1 capsule by mouth daily.        . cycloSPORINE (RESTASIS) 0.05 % ophthalmic emulsion 1 drop daily.        . Folic Acid-Vit B6-Vit B12 (FOLBEE) 2.5-25-1 MG TABS Take 1 tablet by mouth daily.      . Glucosamine HCl 1000 MG TABS Take by mouth.      . hydroxychloroquine (PLAQUENIL) 200 MG tablet Take 200 mg by mouth 2 (  two) times daily.        Marland Kitchen levothyroxine (SYNTHROID, LEVOTHROID) 25 MCG tablet Take 25 mcg by mouth daily.        Marland Kitchen loratadine (CLARITIN) 10 MG tablet Take 10 mg by mouth daily.        . simvastatin (ZOCOR) 40 MG tablet Take 20 mg by mouth at bedtime.       . zoledronic acid (RECLAST) 5 MG/100ML SOLN Inject 5 mg into the vein once. Every year       . DHEA 50 MG CAPS Take 1 capsule by mouth daily.        Marland Kitchen losartan (COZAAR) 50 MG tablet Take 100 mg by mouth daily.       Marland Kitchen PREDNISONE PO Take by mouth. Pt. Takes two and a half mg       No current facility-administered medications for this visit.    Family History  Problem Relation Age of Onset  . Asthma Mother   . COPD Mother   . Asthma Sister   . Heart disease Father   . Parkinsonism      MGM  . Colon cancer Neg Hx   . Esophageal cancer Neg Hx   . Stomach cancer Neg Hx     ROS:  Pertinent items are noted in HPI.  Otherwise, a comprehensive ROS was negative.  Exam:   BP 102/48  Pulse 62  Ht 4' 11.5" (1.511 m)  Wt 125 lb 6.4 oz (56.881 kg)  BMI 24.91 kg/m2 Height: 4' 11.5" (151.1 cm)  Ht  Readings from Last 3 Encounters:  11/06/12 4' 11.5" (1.511 m)  08/21/12 4\' 11"  (1.499 m)  03/17/11 4\' 10"  (1.473 m)    General appearance: alert, cooperative and appears stated age Head: Normocephalic, without obvious abnormality, atraumatic Neck: no adenopathy, supple, symmetrical, trachea midline and thyroid normal to inspection and palpation Lungs: clear to auscultation bilaterally Breasts: normal appearance, no masses or tenderness Heart: regular rate and rhythm Abdomen: soft, non-tender; no masses,  no organomegaly Extremities: extremities normal, atraumatic, no cyanosis or edema Skin: Skin color, texture, turgor normal. No rashes or lesions Lymph nodes: Cervical, supraclavicular, and axillary nodes normal. No abnormal inguinal nodes palpated Neurologic: Grossly normal   Pelvic: External genitalia:  no lesions              Urethra:  normal appearing urethra with no masses, tenderness or lesions              Bartholin's and Skene's: normal                 Vagina: atrophic appearing vagina with pale color and no discharge, no lesions              Cervix: absent              Pap taken: no Bimanual Exam:  Uterus:  uterus absent              Adnexa: no mass, fullness, tenderness               Rectovaginal: Confirms               Anus:  normal sphincter tone, no lesions  A:  Well Woman with normal exam  S/P TAH/ BSO secondary to fibroids with ERT 200- 2003  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

## 2012-11-06 NOTE — Patient Instructions (Signed)

## 2012-11-07 NOTE — Progress Notes (Signed)
Encounter reviewed by Dr. Brook Silva.  

## 2012-11-08 ENCOUNTER — Encounter: Payer: Self-pay | Admitting: Cardiology

## 2012-11-11 ENCOUNTER — Encounter: Payer: Self-pay | Admitting: Cardiology

## 2012-11-12 ENCOUNTER — Encounter: Payer: Self-pay | Admitting: Cardiology

## 2012-11-12 DIAGNOSIS — E039 Hypothyroidism, unspecified: Secondary | ICD-10-CM | POA: Diagnosis not present

## 2012-11-12 DIAGNOSIS — M810 Age-related osteoporosis without current pathological fracture: Secondary | ICD-10-CM | POA: Diagnosis not present

## 2012-11-12 DIAGNOSIS — E785 Hyperlipidemia, unspecified: Secondary | ICD-10-CM | POA: Diagnosis not present

## 2012-11-12 DIAGNOSIS — E1169 Type 2 diabetes mellitus with other specified complication: Secondary | ICD-10-CM | POA: Diagnosis not present

## 2012-11-19 DIAGNOSIS — M810 Age-related osteoporosis without current pathological fracture: Secondary | ICD-10-CM | POA: Diagnosis not present

## 2012-11-19 DIAGNOSIS — I10 Essential (primary) hypertension: Secondary | ICD-10-CM | POA: Diagnosis not present

## 2012-11-19 DIAGNOSIS — Z23 Encounter for immunization: Secondary | ICD-10-CM | POA: Diagnosis not present

## 2012-11-19 DIAGNOSIS — Z0000 Encounter for general adult medical examination without abnormal findings: Secondary | ICD-10-CM | POA: Diagnosis not present

## 2012-11-19 DIAGNOSIS — E785 Hyperlipidemia, unspecified: Secondary | ICD-10-CM | POA: Diagnosis not present

## 2012-11-19 DIAGNOSIS — Z1212 Encounter for screening for malignant neoplasm of rectum: Secondary | ICD-10-CM | POA: Diagnosis not present

## 2012-11-19 DIAGNOSIS — M329 Systemic lupus erythematosus, unspecified: Secondary | ICD-10-CM | POA: Diagnosis not present

## 2012-11-19 DIAGNOSIS — E1129 Type 2 diabetes mellitus with other diabetic kidney complication: Secondary | ICD-10-CM | POA: Diagnosis not present

## 2012-11-19 DIAGNOSIS — E039 Hypothyroidism, unspecified: Secondary | ICD-10-CM | POA: Diagnosis not present

## 2012-11-21 ENCOUNTER — Ambulatory Visit
Admission: RE | Admit: 2012-11-21 | Discharge: 2012-11-21 | Disposition: A | Discharge status: Home / Self Care | Payer: Medicare Other | Source: Ambulatory Visit | Attending: Internal Medicine | Admitting: Internal Medicine

## 2012-11-21 DIAGNOSIS — Z1231 Encounter for screening mammogram for malignant neoplasm of breast: Secondary | ICD-10-CM

## 2012-12-27 DIAGNOSIS — R109 Unspecified abdominal pain: Secondary | ICD-10-CM | POA: Diagnosis not present

## 2012-12-27 DIAGNOSIS — R112 Nausea with vomiting, unspecified: Secondary | ICD-10-CM | POA: Diagnosis not present

## 2012-12-27 DIAGNOSIS — R1013 Epigastric pain: Secondary | ICD-10-CM | POA: Diagnosis not present

## 2012-12-27 DIAGNOSIS — R197 Diarrhea, unspecified: Secondary | ICD-10-CM | POA: Diagnosis not present

## 2012-12-27 DIAGNOSIS — K5289 Other specified noninfective gastroenteritis and colitis: Secondary | ICD-10-CM | POA: Diagnosis not present

## 2012-12-31 DIAGNOSIS — Z09 Encounter for follow-up examination after completed treatment for conditions other than malignant neoplasm: Secondary | ICD-10-CM | POA: Diagnosis not present

## 2012-12-31 DIAGNOSIS — M329 Systemic lupus erythematosus, unspecified: Secondary | ICD-10-CM | POA: Diagnosis not present

## 2012-12-31 DIAGNOSIS — M3500 Sicca syndrome, unspecified: Secondary | ICD-10-CM | POA: Diagnosis not present

## 2012-12-31 DIAGNOSIS — IMO0001 Reserved for inherently not codable concepts without codable children: Secondary | ICD-10-CM | POA: Diagnosis not present

## 2013-01-08 DIAGNOSIS — N390 Urinary tract infection, site not specified: Secondary | ICD-10-CM | POA: Diagnosis not present

## 2013-01-08 DIAGNOSIS — N181 Chronic kidney disease, stage 1: Secondary | ICD-10-CM | POA: Diagnosis not present

## 2013-01-08 DIAGNOSIS — D509 Iron deficiency anemia, unspecified: Secondary | ICD-10-CM | POA: Diagnosis not present

## 2013-01-08 DIAGNOSIS — N2581 Secondary hyperparathyroidism of renal origin: Secondary | ICD-10-CM | POA: Diagnosis not present

## 2013-01-08 DIAGNOSIS — M329 Systemic lupus erythematosus, unspecified: Secondary | ICD-10-CM | POA: Diagnosis not present

## 2013-01-20 DIAGNOSIS — L930 Discoid lupus erythematosus: Secondary | ICD-10-CM | POA: Diagnosis not present

## 2013-01-20 DIAGNOSIS — H00029 Hordeolum internum unspecified eye, unspecified eyelid: Secondary | ICD-10-CM | POA: Diagnosis not present

## 2013-01-30 DIAGNOSIS — Z79899 Other long term (current) drug therapy: Secondary | ICD-10-CM | POA: Diagnosis not present

## 2013-01-30 DIAGNOSIS — E559 Vitamin D deficiency, unspecified: Secondary | ICD-10-CM | POA: Diagnosis not present

## 2013-02-05 ENCOUNTER — Other Ambulatory Visit: Payer: Self-pay

## 2013-04-10 DIAGNOSIS — E559 Vitamin D deficiency, unspecified: Secondary | ICD-10-CM | POA: Diagnosis not present

## 2013-04-10 DIAGNOSIS — Z79899 Other long term (current) drug therapy: Secondary | ICD-10-CM | POA: Diagnosis not present

## 2013-04-17 DIAGNOSIS — N181 Chronic kidney disease, stage 1: Secondary | ICD-10-CM | POA: Diagnosis not present

## 2013-04-17 DIAGNOSIS — D509 Iron deficiency anemia, unspecified: Secondary | ICD-10-CM | POA: Diagnosis not present

## 2013-04-17 DIAGNOSIS — M329 Systemic lupus erythematosus, unspecified: Secondary | ICD-10-CM | POA: Diagnosis not present

## 2013-04-17 DIAGNOSIS — N2581 Secondary hyperparathyroidism of renal origin: Secondary | ICD-10-CM | POA: Diagnosis not present

## 2013-05-08 ENCOUNTER — Other Ambulatory Visit: Payer: Self-pay

## 2013-05-16 DIAGNOSIS — R809 Proteinuria, unspecified: Secondary | ICD-10-CM | POA: Diagnosis not present

## 2013-05-16 DIAGNOSIS — N181 Chronic kidney disease, stage 1: Secondary | ICD-10-CM | POA: Diagnosis not present

## 2013-05-16 DIAGNOSIS — D631 Anemia in chronic kidney disease: Secondary | ICD-10-CM | POA: Diagnosis not present

## 2013-05-16 DIAGNOSIS — D509 Iron deficiency anemia, unspecified: Secondary | ICD-10-CM | POA: Diagnosis not present

## 2013-05-23 DIAGNOSIS — I10 Essential (primary) hypertension: Secondary | ICD-10-CM | POA: Diagnosis not present

## 2013-05-23 DIAGNOSIS — Z23 Encounter for immunization: Secondary | ICD-10-CM | POA: Diagnosis not present

## 2013-05-23 DIAGNOSIS — E785 Hyperlipidemia, unspecified: Secondary | ICD-10-CM | POA: Diagnosis not present

## 2013-05-23 DIAGNOSIS — R21 Rash and other nonspecific skin eruption: Secondary | ICD-10-CM | POA: Diagnosis not present

## 2013-05-23 DIAGNOSIS — M329 Systemic lupus erythematosus, unspecified: Secondary | ICD-10-CM | POA: Diagnosis not present

## 2013-05-23 DIAGNOSIS — E039 Hypothyroidism, unspecified: Secondary | ICD-10-CM | POA: Diagnosis not present

## 2013-05-23 DIAGNOSIS — E1129 Type 2 diabetes mellitus with other diabetic kidney complication: Secondary | ICD-10-CM | POA: Diagnosis not present

## 2013-05-27 DIAGNOSIS — L253 Unspecified contact dermatitis due to other chemical products: Secondary | ICD-10-CM | POA: Diagnosis not present

## 2013-06-04 DIAGNOSIS — M329 Systemic lupus erythematosus, unspecified: Secondary | ICD-10-CM | POA: Diagnosis not present

## 2013-06-04 DIAGNOSIS — IMO0001 Reserved for inherently not codable concepts without codable children: Secondary | ICD-10-CM | POA: Diagnosis not present

## 2013-06-04 DIAGNOSIS — M3500 Sicca syndrome, unspecified: Secondary | ICD-10-CM | POA: Diagnosis not present

## 2013-06-04 DIAGNOSIS — M25569 Pain in unspecified knee: Secondary | ICD-10-CM | POA: Diagnosis not present

## 2013-07-21 DIAGNOSIS — H468 Other optic neuritis: Secondary | ICD-10-CM | POA: Diagnosis not present

## 2013-07-21 DIAGNOSIS — H538 Other visual disturbances: Secondary | ICD-10-CM | POA: Diagnosis not present

## 2013-07-21 DIAGNOSIS — L930 Discoid lupus erythematosus: Secondary | ICD-10-CM | POA: Diagnosis not present

## 2013-09-01 DIAGNOSIS — K219 Gastro-esophageal reflux disease without esophagitis: Secondary | ICD-10-CM | POA: Diagnosis not present

## 2013-09-01 DIAGNOSIS — M3500 Sicca syndrome, unspecified: Secondary | ICD-10-CM | POA: Diagnosis not present

## 2013-09-01 DIAGNOSIS — R070 Pain in throat: Secondary | ICD-10-CM | POA: Diagnosis not present

## 2013-09-01 DIAGNOSIS — Z6826 Body mass index (BMI) 26.0-26.9, adult: Secondary | ICD-10-CM | POA: Diagnosis not present

## 2013-09-11 DIAGNOSIS — M3500 Sicca syndrome, unspecified: Secondary | ICD-10-CM | POA: Diagnosis not present

## 2013-09-11 DIAGNOSIS — J387 Other diseases of larynx: Secondary | ICD-10-CM | POA: Diagnosis not present

## 2013-09-11 DIAGNOSIS — F458 Other somatoform disorders: Secondary | ICD-10-CM | POA: Diagnosis not present

## 2013-10-30 DIAGNOSIS — Z79899 Other long term (current) drug therapy: Secondary | ICD-10-CM | POA: Diagnosis not present

## 2013-11-04 DIAGNOSIS — M329 Systemic lupus erythematosus, unspecified: Secondary | ICD-10-CM | POA: Diagnosis not present

## 2013-11-04 DIAGNOSIS — IMO0001 Reserved for inherently not codable concepts without codable children: Secondary | ICD-10-CM | POA: Diagnosis not present

## 2013-11-04 DIAGNOSIS — Z09 Encounter for follow-up examination after completed treatment for conditions other than malignant neoplasm: Secondary | ICD-10-CM | POA: Diagnosis not present

## 2013-11-04 DIAGNOSIS — M3500 Sicca syndrome, unspecified: Secondary | ICD-10-CM | POA: Diagnosis not present

## 2013-11-11 DIAGNOSIS — R1313 Dysphagia, pharyngeal phase: Secondary | ICD-10-CM | POA: Diagnosis not present

## 2013-11-11 DIAGNOSIS — M2550 Pain in unspecified joint: Secondary | ICD-10-CM | POA: Diagnosis not present

## 2013-11-11 DIAGNOSIS — M3500 Sicca syndrome, unspecified: Secondary | ICD-10-CM | POA: Diagnosis not present

## 2013-11-11 DIAGNOSIS — K219 Gastro-esophageal reflux disease without esophagitis: Secondary | ICD-10-CM | POA: Diagnosis not present

## 2013-11-11 DIAGNOSIS — F458 Other somatoform disorders: Secondary | ICD-10-CM | POA: Diagnosis not present

## 2013-11-11 DIAGNOSIS — R300 Dysuria: Secondary | ICD-10-CM | POA: Diagnosis not present

## 2013-11-11 DIAGNOSIS — E559 Vitamin D deficiency, unspecified: Secondary | ICD-10-CM | POA: Diagnosis not present

## 2013-11-17 DIAGNOSIS — E039 Hypothyroidism, unspecified: Secondary | ICD-10-CM | POA: Diagnosis not present

## 2013-11-17 DIAGNOSIS — M810 Age-related osteoporosis without current pathological fracture: Secondary | ICD-10-CM | POA: Diagnosis not present

## 2013-11-17 DIAGNOSIS — E119 Type 2 diabetes mellitus without complications: Secondary | ICD-10-CM | POA: Diagnosis not present

## 2013-11-17 DIAGNOSIS — I10 Essential (primary) hypertension: Secondary | ICD-10-CM | POA: Diagnosis not present

## 2013-11-17 DIAGNOSIS — E785 Hyperlipidemia, unspecified: Secondary | ICD-10-CM | POA: Diagnosis not present

## 2013-11-17 DIAGNOSIS — R82998 Other abnormal findings in urine: Secondary | ICD-10-CM | POA: Diagnosis not present

## 2013-11-20 DIAGNOSIS — E039 Hypothyroidism, unspecified: Secondary | ICD-10-CM | POA: Diagnosis not present

## 2013-11-20 DIAGNOSIS — E119 Type 2 diabetes mellitus without complications: Secondary | ICD-10-CM | POA: Diagnosis not present

## 2013-11-20 DIAGNOSIS — Z1212 Encounter for screening for malignant neoplasm of rectum: Secondary | ICD-10-CM | POA: Diagnosis not present

## 2013-11-20 DIAGNOSIS — Z0000 Encounter for general adult medical examination without abnormal findings: Secondary | ICD-10-CM | POA: Diagnosis not present

## 2013-11-20 DIAGNOSIS — M329 Systemic lupus erythematosus, unspecified: Secondary | ICD-10-CM | POA: Diagnosis not present

## 2013-11-20 DIAGNOSIS — I10 Essential (primary) hypertension: Secondary | ICD-10-CM | POA: Diagnosis not present

## 2013-11-20 DIAGNOSIS — M810 Age-related osteoporosis without current pathological fracture: Secondary | ICD-10-CM | POA: Diagnosis not present

## 2013-11-20 DIAGNOSIS — E785 Hyperlipidemia, unspecified: Secondary | ICD-10-CM | POA: Diagnosis not present

## 2013-11-20 DIAGNOSIS — N183 Chronic kidney disease, stage 3 unspecified: Secondary | ICD-10-CM | POA: Diagnosis not present

## 2013-12-02 DIAGNOSIS — Z6826 Body mass index (BMI) 26.0-26.9, adult: Secondary | ICD-10-CM | POA: Diagnosis not present

## 2013-12-02 DIAGNOSIS — E119 Type 2 diabetes mellitus without complications: Secondary | ICD-10-CM | POA: Diagnosis not present

## 2013-12-02 DIAGNOSIS — L568 Other specified acute skin changes due to ultraviolet radiation: Secondary | ICD-10-CM | POA: Diagnosis not present

## 2013-12-02 DIAGNOSIS — M329 Systemic lupus erythematosus, unspecified: Secondary | ICD-10-CM | POA: Diagnosis not present

## 2013-12-05 DIAGNOSIS — R809 Proteinuria, unspecified: Secondary | ICD-10-CM | POA: Diagnosis not present

## 2013-12-29 ENCOUNTER — Other Ambulatory Visit: Payer: Self-pay

## 2013-12-29 DIAGNOSIS — Z1231 Encounter for screening mammogram for malignant neoplasm of breast: Secondary | ICD-10-CM

## 2014-01-06 ENCOUNTER — Ambulatory Visit: Payer: Medicare Other

## 2014-01-13 ENCOUNTER — Ambulatory Visit
Admission: RE | Admit: 2014-01-13 | Discharge: 2014-01-13 | Disposition: A | Discharge status: Home / Self Care | Payer: Medicare Other | Source: Ambulatory Visit

## 2014-01-13 DIAGNOSIS — Z1231 Encounter for screening mammogram for malignant neoplasm of breast: Secondary | ICD-10-CM

## 2014-01-20 DIAGNOSIS — Z961 Presence of intraocular lens: Secondary | ICD-10-CM | POA: Diagnosis not present

## 2014-01-20 DIAGNOSIS — H538 Other visual disturbances: Secondary | ICD-10-CM | POA: Diagnosis not present

## 2014-01-20 DIAGNOSIS — L930 Discoid lupus erythematosus: Secondary | ICD-10-CM | POA: Diagnosis not present

## 2014-01-20 DIAGNOSIS — H468 Other optic neuritis: Secondary | ICD-10-CM | POA: Diagnosis not present

## 2014-02-23 DIAGNOSIS — M329 Systemic lupus erythematosus, unspecified: Secondary | ICD-10-CM | POA: Diagnosis not present

## 2014-02-23 DIAGNOSIS — R809 Proteinuria, unspecified: Secondary | ICD-10-CM | POA: Diagnosis not present

## 2014-02-23 DIAGNOSIS — D509 Iron deficiency anemia, unspecified: Secondary | ICD-10-CM | POA: Diagnosis not present

## 2014-02-25 DIAGNOSIS — N181 Chronic kidney disease, stage 1: Secondary | ICD-10-CM | POA: Diagnosis not present

## 2014-02-25 DIAGNOSIS — D509 Iron deficiency anemia, unspecified: Secondary | ICD-10-CM | POA: Diagnosis not present

## 2014-02-25 DIAGNOSIS — R809 Proteinuria, unspecified: Secondary | ICD-10-CM | POA: Diagnosis not present

## 2014-02-25 DIAGNOSIS — D631 Anemia in chronic kidney disease: Secondary | ICD-10-CM | POA: Diagnosis not present

## 2014-05-01 DIAGNOSIS — R300 Dysuria: Secondary | ICD-10-CM | POA: Diagnosis not present

## 2014-05-01 DIAGNOSIS — Z79899 Other long term (current) drug therapy: Secondary | ICD-10-CM | POA: Diagnosis not present

## 2014-05-01 DIAGNOSIS — M2550 Pain in unspecified joint: Secondary | ICD-10-CM | POA: Diagnosis not present

## 2014-05-04 ENCOUNTER — Encounter: Payer: Self-pay | Admitting: Nurse Practitioner

## 2014-05-07 DIAGNOSIS — M329 Systemic lupus erythematosus, unspecified: Secondary | ICD-10-CM | POA: Diagnosis not present

## 2014-05-07 DIAGNOSIS — M19041 Primary osteoarthritis, right hand: Secondary | ICD-10-CM | POA: Diagnosis not present

## 2014-05-07 DIAGNOSIS — M3500 Sicca syndrome, unspecified: Secondary | ICD-10-CM | POA: Diagnosis not present

## 2014-05-07 DIAGNOSIS — Z09 Encounter for follow-up examination after completed treatment for conditions other than malignant neoplasm: Secondary | ICD-10-CM | POA: Diagnosis not present

## 2014-05-26 DIAGNOSIS — E119 Type 2 diabetes mellitus without complications: Secondary | ICD-10-CM | POA: Diagnosis not present

## 2014-05-26 DIAGNOSIS — E039 Hypothyroidism, unspecified: Secondary | ICD-10-CM | POA: Diagnosis not present

## 2014-05-26 DIAGNOSIS — Z1389 Encounter for screening for other disorder: Secondary | ICD-10-CM | POA: Diagnosis not present

## 2014-05-26 DIAGNOSIS — Z23 Encounter for immunization: Secondary | ICD-10-CM | POA: Diagnosis not present

## 2014-05-26 DIAGNOSIS — M859 Disorder of bone density and structure, unspecified: Secondary | ICD-10-CM | POA: Diagnosis not present

## 2014-05-26 DIAGNOSIS — E785 Hyperlipidemia, unspecified: Secondary | ICD-10-CM | POA: Diagnosis not present

## 2014-05-26 DIAGNOSIS — I10 Essential (primary) hypertension: Secondary | ICD-10-CM | POA: Diagnosis not present

## 2014-05-26 DIAGNOSIS — R0789 Other chest pain: Secondary | ICD-10-CM | POA: Diagnosis not present

## 2014-05-26 DIAGNOSIS — M810 Age-related osteoporosis without current pathological fracture: Secondary | ICD-10-CM | POA: Diagnosis not present

## 2014-06-04 DIAGNOSIS — Z79899 Other long term (current) drug therapy: Secondary | ICD-10-CM | POA: Diagnosis not present

## 2014-06-04 DIAGNOSIS — M2550 Pain in unspecified joint: Secondary | ICD-10-CM | POA: Diagnosis not present

## 2014-10-22 DIAGNOSIS — N189 Chronic kidney disease, unspecified: Secondary | ICD-10-CM | POA: Diagnosis not present

## 2014-10-22 DIAGNOSIS — E611 Iron deficiency: Secondary | ICD-10-CM | POA: Diagnosis not present

## 2014-10-22 DIAGNOSIS — R809 Proteinuria, unspecified: Secondary | ICD-10-CM | POA: Diagnosis not present

## 2014-10-22 DIAGNOSIS — N181 Chronic kidney disease, stage 1: Secondary | ICD-10-CM | POA: Diagnosis not present

## 2014-10-22 DIAGNOSIS — Z79899 Other long term (current) drug therapy: Secondary | ICD-10-CM | POA: Diagnosis not present

## 2014-10-22 DIAGNOSIS — M2390 Unspecified internal derangement of unspecified knee: Secondary | ICD-10-CM | POA: Diagnosis not present

## 2014-10-29 DIAGNOSIS — E1122 Type 2 diabetes mellitus with diabetic chronic kidney disease: Secondary | ICD-10-CM | POA: Diagnosis not present

## 2014-10-29 DIAGNOSIS — D631 Anemia in chronic kidney disease: Secondary | ICD-10-CM | POA: Diagnosis not present

## 2014-10-29 DIAGNOSIS — N181 Chronic kidney disease, stage 1: Secondary | ICD-10-CM | POA: Diagnosis not present

## 2014-10-29 DIAGNOSIS — M329 Systemic lupus erythematosus, unspecified: Secondary | ICD-10-CM | POA: Diagnosis not present

## 2014-10-29 DIAGNOSIS — N059 Unspecified nephritic syndrome with unspecified morphologic changes: Secondary | ICD-10-CM | POA: Diagnosis not present

## 2014-10-29 DIAGNOSIS — H469 Unspecified optic neuritis: Secondary | ICD-10-CM | POA: Diagnosis not present

## 2014-10-29 DIAGNOSIS — M797 Fibromyalgia: Secondary | ICD-10-CM | POA: Diagnosis not present

## 2014-10-30 DIAGNOSIS — S39012A Strain of muscle, fascia and tendon of lower back, initial encounter: Secondary | ICD-10-CM | POA: Diagnosis not present

## 2014-11-16 DIAGNOSIS — E119 Type 2 diabetes mellitus without complications: Secondary | ICD-10-CM | POA: Diagnosis not present

## 2014-11-16 DIAGNOSIS — E785 Hyperlipidemia, unspecified: Secondary | ICD-10-CM | POA: Diagnosis not present

## 2014-11-16 DIAGNOSIS — E039 Hypothyroidism, unspecified: Secondary | ICD-10-CM | POA: Diagnosis not present

## 2014-11-16 DIAGNOSIS — M810 Age-related osteoporosis without current pathological fracture: Secondary | ICD-10-CM | POA: Diagnosis not present

## 2014-11-16 DIAGNOSIS — N390 Urinary tract infection, site not specified: Secondary | ICD-10-CM | POA: Diagnosis not present

## 2014-11-16 DIAGNOSIS — Z0000 Encounter for general adult medical examination without abnormal findings: Secondary | ICD-10-CM | POA: Diagnosis not present

## 2014-11-23 DIAGNOSIS — E119 Type 2 diabetes mellitus without complications: Secondary | ICD-10-CM | POA: Diagnosis not present

## 2014-11-23 DIAGNOSIS — D638 Anemia in other chronic diseases classified elsewhere: Secondary | ICD-10-CM | POA: Diagnosis not present

## 2014-11-23 DIAGNOSIS — Z1389 Encounter for screening for other disorder: Secondary | ICD-10-CM | POA: Diagnosis not present

## 2014-11-23 DIAGNOSIS — Z0000 Encounter for general adult medical examination without abnormal findings: Secondary | ICD-10-CM | POA: Diagnosis not present

## 2014-11-23 DIAGNOSIS — N183 Chronic kidney disease, stage 3 (moderate): Secondary | ICD-10-CM | POA: Diagnosis not present

## 2014-11-23 DIAGNOSIS — E785 Hyperlipidemia, unspecified: Secondary | ICD-10-CM | POA: Diagnosis not present

## 2014-11-23 DIAGNOSIS — M810 Age-related osteoporosis without current pathological fracture: Secondary | ICD-10-CM | POA: Diagnosis not present

## 2014-11-23 DIAGNOSIS — Z6827 Body mass index (BMI) 27.0-27.9, adult: Secondary | ICD-10-CM | POA: Diagnosis not present

## 2014-11-23 DIAGNOSIS — Z23 Encounter for immunization: Secondary | ICD-10-CM | POA: Diagnosis not present

## 2014-11-23 DIAGNOSIS — I10 Essential (primary) hypertension: Secondary | ICD-10-CM | POA: Diagnosis not present

## 2014-11-23 DIAGNOSIS — M3210 Systemic lupus erythematosus, organ or system involvement unspecified: Secondary | ICD-10-CM | POA: Diagnosis not present

## 2014-11-23 DIAGNOSIS — E039 Hypothyroidism, unspecified: Secondary | ICD-10-CM | POA: Diagnosis not present

## 2014-12-02 DIAGNOSIS — Z1212 Encounter for screening for malignant neoplasm of rectum: Secondary | ICD-10-CM | POA: Diagnosis not present

## 2014-12-28 ENCOUNTER — Other Ambulatory Visit: Payer: Self-pay

## 2014-12-28 DIAGNOSIS — Z1231 Encounter for screening mammogram for malignant neoplasm of breast: Secondary | ICD-10-CM

## 2015-01-29 ENCOUNTER — Ambulatory Visit: Payer: Medicare Other

## 2015-03-05 ENCOUNTER — Encounter: Payer: Self-pay | Admitting: Nurse Practitioner

## 2015-03-05 ENCOUNTER — Ambulatory Visit (INDEPENDENT_AMBULATORY_CARE_PROVIDER_SITE_OTHER): Payer: Medicare Other | Admitting: Nurse Practitioner

## 2015-03-05 VITALS — BP 144/70 | HR 60 | Ht <= 58 in | Wt 125.0 lb

## 2015-03-05 DIAGNOSIS — Z01419 Encounter for gynecological examination (general) (routine) without abnormal findings: Secondary | ICD-10-CM | POA: Diagnosis not present

## 2015-03-05 DIAGNOSIS — Z79890 Hormone replacement therapy (postmenopausal): Secondary | ICD-10-CM

## 2015-03-05 DIAGNOSIS — Z0000 Encounter for general adult medical examination without abnormal findings: Secondary | ICD-10-CM

## 2015-03-05 DIAGNOSIS — M810 Age-related osteoporosis without current pathological fracture: Secondary | ICD-10-CM

## 2015-03-05 DIAGNOSIS — Z124 Encounter for screening for malignant neoplasm of cervix: Secondary | ICD-10-CM | POA: Diagnosis not present

## 2015-03-05 DIAGNOSIS — N952 Postmenopausal atrophic vaginitis: Secondary | ICD-10-CM | POA: Diagnosis not present

## 2015-03-05 NOTE — Patient Instructions (Addendum)

## 2015-03-05 NOTE — Progress Notes (Signed)
Patient ID: Veronica Jordan, female   DOB: 1946-05-12, 69 y.o.   MRN: [id number] 69 y.o. [id number] Married  Caucasian Fe here for annual exam.  No new health problems. Not on Vagifem currently. No UTI symptoms.  Patient's last menstrual period was 07/03/1998 (approximate).          Sexually active: No.  The current method of family planning is status post hysterectomy.    Exercising: No.  The patient does not participate in regular exercise at present.  Pt goes up and down basement stairs at home. Smoker:  no  Health Maintenance: Pap: Not indicated since hysterectomy in 2000 secondary to fibroids MMG:  01/13/14, 3D, Bi-Rads 1:  Negative scheduled for 03/09/15 Colonoscopy: 12/2010 normal recheck in 10 years BMD: 05/2012 Reclast for 3 years 2012, 2013 and in Jan 2014.   Recheck BMD was done 05/2014 at [facility] and was stable some improvement TDaP: Up to date, per pt. ? 2015 Prevnar 13: 12/2014 Shingles: not yet Labs:  PCP   reports that she has never smoked. She has never used smokeless tobacco. She reports that she does not drink alcohol or use illicit drugs.  Past Medical History  Diagnosis Date  . Allergic rhinitis   . Diabetes mellitus   . Hyperlipemia   . Hypertension   . Systemic lupus 2001  . GERD (gastroesophageal reflux disease)   . Osteoporosis   . Sjogren's syndrome   . Anxiety and depression   . Anemia   . Osteoarthritis   . Fibromyalgia   . History of gallstones   . IBS (irritable bowel syndrome)   . History of kidney stones   . Diverticulitis   . Renal calculi   . Hypothyroidism   . Depression     Past Surgical History  Procedure Laterality Date  . Cholecystectomy  1988  . Breast biopsy    . Appendectomy  1970  . Cesarean section  1976, 1977    x2  . Cataract extraction      bilateral  . Total abdominal hysterectomy  2000    BSO due to fibroids    Current Outpatient Prescriptions  Medication Sig Dispense Refill  . amLODipine (NORVASC) 10 MG  tablet Take 10 mg by mouth daily.      Marland Kitchen aspirin 81 MG tablet Take 81 mg by mouth daily.      . Calcium Carbonate-Vitamin D (CALTRATE 600+D) 600-400 MG-UNIT per tablet Take 1 tablet by mouth daily.    . citalopram (CELEXA) 20 MG tablet Take 20 mg by mouth daily.      . Coenzyme Q10 (COQ-10) 50 MG CAPS Take 1 capsule by mouth daily.      . cycloSPORINE (RESTASIS) 0.05 % ophthalmic emulsion 1 drop daily.      . Folic Acid-Vit B6-Vit B12 (FOLBEE) 2.5-25-1 MG TABS Take 1 tablet by mouth daily.    Marland Kitchen levothyroxine (SYNTHROID, LEVOTHROID) 25 MCG tablet Take 25 mcg by mouth daily.      Marland Kitchen losartan (COZAAR) 100 MG tablet Take 100 mg by mouth daily.    . simvastatin (ZOCOR) 40 MG tablet Take 20 mg by mouth at bedtime.      No current facility-administered medications for this visit.    Family History  Problem Relation Age of Onset  . Asthma Mother   . COPD Mother   . Asthma Sister   . Heart disease Father   . Parkinsonism      MGM  . Colon cancer Neg Hx   .  Esophageal cancer Neg Hx   . Stomach cancer Neg Hx     ROS:  Pertinent items are noted in HPI.  Otherwise, a comprehensive ROS was negative.  Exam:   BP 150/72 mmHg  Pulse 60  Ht 4\' 10"  (1.473 m)  Wt 125 lb (56.7 kg)  BMI 26.13 kg/m2  LMP 07/03/1998 (Approximate) Height: 4\' 10"  (147.3 cm) Ht Readings from Last 3 Encounters:  03/05/15 4\' 10"  (1.473 m)  11/06/12 4' 11.5" (1.511 m)  08/21/12 4\' 11"  (1.499 m)    General appearance: alert, cooperative and appears stated age Head: Normocephalic, without obvious abnormality, atraumatic Neck: no adenopathy, supple, symmetrical, trachea midline and thyroid normal to inspection and palpation Lungs: clear to auscultation bilaterally Breasts: normal appearance, no masses or tenderness Heart: regular rate and rhythm Abdomen: soft, non-tender; no masses,  no organomegaly Extremities: extremities normal, atraumatic, no cyanosis or edema Skin: Skin color, texture, turgor normal. No rashes  or lesions Lymph nodes: Cervical, supraclavicular, and axillary nodes normal. No abnormal inguinal nodes palpated Neurologic: Grossly normal   Pelvic: External genitalia:  no lesions              Urethra:  normal appearing urethra with no masses, tenderness or lesions              Bartholin's and Skene's: normal                 Vagina: normal appearing vagina with normal color and discharge, no lesions              Cervix: absent              Pap taken: No. Bimanual Exam:  Uterus:  uterus absent              Adnexa: no mass, fullness, tenderness               Rectovaginal: Confirms               Anus:  normal sphincter tone, no lesions,  There is a small sebaceous cyst area right perianal area that is not tender or presence of exudate  Chaperone present: no  A:  Well Woman with normal exam  S/P TAH/ BSO secondary to fibroids with ERT 2000- 2003 Atrophic vaginitis off Vagifem secondary to cost Osteoporosis on Reclast - followed by PCP   P:   Reviewed health and wellness pertinent to exam  Pap smear as above  Mammogram is scheduled  Discussed OTC treatment for atrophy  Discussed and showed her area of perianal cyst and she will follow with GI  Counseled on breast self exam, mammography screening, adequate intake of calcium and vitamin D, diet and exercise return annually or prn  An After Visit Summary was printed and given to the patient.

## 2015-03-07 NOTE — Progress Notes (Signed)
Encounter reviewed by Dr. Rashada Klontz Amundson C. Silva.  

## 2015-03-09 ENCOUNTER — Ambulatory Visit
Admission: RE | Admit: 2015-03-09 | Discharge: 2015-03-09 | Disposition: A | Discharge status: Home / Self Care | Payer: Medicare Other | Source: Ambulatory Visit

## 2015-03-09 DIAGNOSIS — Z1231 Encounter for screening mammogram for malignant neoplasm of breast: Secondary | ICD-10-CM | POA: Diagnosis not present

## 2015-04-01 DIAGNOSIS — Z961 Presence of intraocular lens: Secondary | ICD-10-CM | POA: Diagnosis not present

## 2015-04-01 DIAGNOSIS — L930 Discoid lupus erythematosus: Secondary | ICD-10-CM | POA: Diagnosis not present

## 2015-04-01 DIAGNOSIS — H468 Other optic neuritis: Secondary | ICD-10-CM | POA: Diagnosis not present

## 2015-04-01 DIAGNOSIS — H538 Other visual disturbances: Secondary | ICD-10-CM | POA: Diagnosis not present

## 2015-05-20 DIAGNOSIS — L301 Dyshidrosis [pompholyx]: Secondary | ICD-10-CM | POA: Diagnosis not present

## 2015-05-20 DIAGNOSIS — L309 Dermatitis, unspecified: Secondary | ICD-10-CM | POA: Diagnosis not present

## 2015-05-20 DIAGNOSIS — Z6826 Body mass index (BMI) 26.0-26.9, adult: Secondary | ICD-10-CM | POA: Diagnosis not present

## 2015-05-20 DIAGNOSIS — Z23 Encounter for immunization: Secondary | ICD-10-CM | POA: Diagnosis not present

## 2015-05-26 DIAGNOSIS — E039 Hypothyroidism, unspecified: Secondary | ICD-10-CM | POA: Diagnosis not present

## 2015-05-26 DIAGNOSIS — I10 Essential (primary) hypertension: Secondary | ICD-10-CM | POA: Diagnosis not present

## 2015-05-26 DIAGNOSIS — F3341 Major depressive disorder, recurrent, in partial remission: Secondary | ICD-10-CM | POA: Diagnosis not present

## 2015-05-26 DIAGNOSIS — Z1389 Encounter for screening for other disorder: Secondary | ICD-10-CM | POA: Diagnosis not present

## 2015-05-26 DIAGNOSIS — Z6826 Body mass index (BMI) 26.0-26.9, adult: Secondary | ICD-10-CM | POA: Diagnosis not present

## 2015-05-26 DIAGNOSIS — L309 Dermatitis, unspecified: Secondary | ICD-10-CM | POA: Diagnosis not present

## 2015-05-26 DIAGNOSIS — E119 Type 2 diabetes mellitus without complications: Secondary | ICD-10-CM | POA: Diagnosis not present

## 2015-05-26 DIAGNOSIS — E785 Hyperlipidemia, unspecified: Secondary | ICD-10-CM | POA: Diagnosis not present

## 2015-05-26 DIAGNOSIS — R8290 Unspecified abnormal findings in urine: Secondary | ICD-10-CM | POA: Diagnosis not present

## 2015-09-28 DIAGNOSIS — Z6825 Body mass index (BMI) 25.0-25.9, adult: Secondary | ICD-10-CM | POA: Diagnosis not present

## 2015-09-28 DIAGNOSIS — E119 Type 2 diabetes mellitus without complications: Secondary | ICD-10-CM | POA: Diagnosis not present

## 2015-09-28 DIAGNOSIS — K648 Other hemorrhoids: Secondary | ICD-10-CM | POA: Diagnosis not present

## 2015-09-28 DIAGNOSIS — L309 Dermatitis, unspecified: Secondary | ICD-10-CM | POA: Diagnosis not present

## 2015-09-28 DIAGNOSIS — K589 Irritable bowel syndrome without diarrhea: Secondary | ICD-10-CM | POA: Diagnosis not present

## 2015-12-02 DIAGNOSIS — N390 Urinary tract infection, site not specified: Secondary | ICD-10-CM | POA: Diagnosis not present

## 2015-12-02 DIAGNOSIS — E038 Other specified hypothyroidism: Secondary | ICD-10-CM | POA: Diagnosis not present

## 2015-12-02 DIAGNOSIS — E784 Other hyperlipidemia: Secondary | ICD-10-CM | POA: Diagnosis not present

## 2015-12-02 DIAGNOSIS — I10 Essential (primary) hypertension: Secondary | ICD-10-CM | POA: Diagnosis not present

## 2015-12-02 DIAGNOSIS — E119 Type 2 diabetes mellitus without complications: Secondary | ICD-10-CM | POA: Diagnosis not present

## 2015-12-02 DIAGNOSIS — M810 Age-related osteoporosis without current pathological fracture: Secondary | ICD-10-CM | POA: Diagnosis not present

## 2015-12-02 DIAGNOSIS — R8290 Unspecified abnormal findings in urine: Secondary | ICD-10-CM | POA: Diagnosis not present

## 2015-12-07 DIAGNOSIS — M329 Systemic lupus erythematosus, unspecified: Secondary | ICD-10-CM | POA: Diagnosis not present

## 2015-12-07 DIAGNOSIS — R809 Proteinuria, unspecified: Secondary | ICD-10-CM | POA: Diagnosis not present

## 2015-12-07 DIAGNOSIS — N189 Chronic kidney disease, unspecified: Secondary | ICD-10-CM | POA: Diagnosis not present

## 2015-12-09 DIAGNOSIS — Z0000 Encounter for general adult medical examination without abnormal findings: Secondary | ICD-10-CM | POA: Diagnosis not present

## 2015-12-09 DIAGNOSIS — E784 Other hyperlipidemia: Secondary | ICD-10-CM | POA: Diagnosis not present

## 2015-12-09 DIAGNOSIS — E038 Other specified hypothyroidism: Secondary | ICD-10-CM | POA: Diagnosis not present

## 2015-12-09 DIAGNOSIS — M810 Age-related osteoporosis without current pathological fracture: Secondary | ICD-10-CM | POA: Diagnosis not present

## 2015-12-09 DIAGNOSIS — Z6825 Body mass index (BMI) 25.0-25.9, adult: Secondary | ICD-10-CM | POA: Diagnosis not present

## 2015-12-09 DIAGNOSIS — I10 Essential (primary) hypertension: Secondary | ICD-10-CM | POA: Diagnosis not present

## 2015-12-09 DIAGNOSIS — Z1389 Encounter for screening for other disorder: Secondary | ICD-10-CM | POA: Diagnosis not present

## 2015-12-09 DIAGNOSIS — N183 Chronic kidney disease, stage 3 (moderate): Secondary | ICD-10-CM | POA: Diagnosis not present

## 2015-12-09 DIAGNOSIS — M5416 Radiculopathy, lumbar region: Secondary | ICD-10-CM | POA: Diagnosis not present

## 2015-12-09 DIAGNOSIS — E119 Type 2 diabetes mellitus without complications: Secondary | ICD-10-CM | POA: Diagnosis not present

## 2015-12-10 DIAGNOSIS — N183 Chronic kidney disease, stage 3 (moderate): Secondary | ICD-10-CM | POA: Diagnosis not present

## 2015-12-10 DIAGNOSIS — D631 Anemia in chronic kidney disease: Secondary | ICD-10-CM | POA: Diagnosis not present

## 2015-12-10 DIAGNOSIS — M329 Systemic lupus erythematosus, unspecified: Secondary | ICD-10-CM | POA: Diagnosis not present

## 2015-12-10 DIAGNOSIS — E1122 Type 2 diabetes mellitus with diabetic chronic kidney disease: Secondary | ICD-10-CM | POA: Diagnosis not present

## 2015-12-10 DIAGNOSIS — R809 Proteinuria, unspecified: Secondary | ICD-10-CM | POA: Diagnosis not present

## 2015-12-10 DIAGNOSIS — E611 Iron deficiency: Secondary | ICD-10-CM | POA: Diagnosis not present

## 2016-01-29 DIAGNOSIS — S0990XA Unspecified injury of head, initial encounter: Secondary | ICD-10-CM | POA: Diagnosis not present

## 2016-01-29 DIAGNOSIS — S92034A Nondisplaced avulsion fracture of tuberosity of right calcaneus, initial encounter for closed fracture: Secondary | ICD-10-CM | POA: Diagnosis not present

## 2016-01-29 DIAGNOSIS — S00432A Contusion of left ear, initial encounter: Secondary | ICD-10-CM | POA: Diagnosis not present

## 2016-01-29 DIAGNOSIS — M25561 Pain in right knee: Secondary | ICD-10-CM | POA: Diagnosis not present

## 2016-01-29 DIAGNOSIS — S92031A Displaced avulsion fracture of tuberosity of right calcaneus, initial encounter for closed fracture: Secondary | ICD-10-CM | POA: Diagnosis not present

## 2016-01-29 DIAGNOSIS — M25511 Pain in right shoulder: Secondary | ICD-10-CM | POA: Diagnosis not present

## 2016-01-29 DIAGNOSIS — S3991XA Unspecified injury of abdomen, initial encounter: Secondary | ICD-10-CM | POA: Diagnosis not present

## 2016-01-29 DIAGNOSIS — S4991XA Unspecified injury of right shoulder and upper arm, initial encounter: Secondary | ICD-10-CM | POA: Diagnosis not present

## 2016-01-29 DIAGNOSIS — S46911A Strain of unspecified muscle, fascia and tendon at shoulder and upper arm level, right arm, initial encounter: Secondary | ICD-10-CM | POA: Diagnosis not present

## 2016-01-29 DIAGNOSIS — S199XXA Unspecified injury of neck, initial encounter: Secondary | ICD-10-CM | POA: Diagnosis not present

## 2016-01-29 DIAGNOSIS — M25551 Pain in right hip: Secondary | ICD-10-CM | POA: Diagnosis not present

## 2016-01-29 DIAGNOSIS — S301XXA Contusion of abdominal wall, initial encounter: Secondary | ICD-10-CM | POA: Diagnosis not present

## 2016-01-29 DIAGNOSIS — S79911A Unspecified injury of right hip, initial encounter: Secondary | ICD-10-CM | POA: Diagnosis not present

## 2016-01-31 DIAGNOSIS — S92031A Displaced avulsion fracture of tuberosity of right calcaneus, initial encounter for closed fracture: Secondary | ICD-10-CM | POA: Diagnosis not present

## 2016-02-02 DIAGNOSIS — S92013A Displaced fracture of body of unspecified calcaneus, initial encounter for closed fracture: Secondary | ICD-10-CM | POA: Diagnosis not present

## 2016-02-02 DIAGNOSIS — W109XXA Fall (on) (from) unspecified stairs and steps, initial encounter: Secondary | ICD-10-CM | POA: Diagnosis not present

## 2016-02-02 DIAGNOSIS — M810 Age-related osteoporosis without current pathological fracture: Secondary | ICD-10-CM | POA: Diagnosis not present

## 2016-02-28 DIAGNOSIS — S92031D Displaced avulsion fracture of tuberosity of right calcaneus, subsequent encounter for fracture with routine healing: Secondary | ICD-10-CM | POA: Diagnosis not present

## 2016-03-21 DIAGNOSIS — W19XXXA Unspecified fall, initial encounter: Secondary | ICD-10-CM | POA: Diagnosis not present

## 2016-03-21 DIAGNOSIS — Z6825 Body mass index (BMI) 25.0-25.9, adult: Secondary | ICD-10-CM | POA: Diagnosis not present

## 2016-03-21 DIAGNOSIS — R51 Headache: Secondary | ICD-10-CM | POA: Diagnosis not present

## 2016-03-21 DIAGNOSIS — G3184 Mild cognitive impairment, so stated: Secondary | ICD-10-CM | POA: Diagnosis not present

## 2016-03-23 ENCOUNTER — Other Ambulatory Visit: Payer: Self-pay | Admitting: Internal Medicine

## 2016-03-23 DIAGNOSIS — R519 Headache, unspecified: Secondary | ICD-10-CM

## 2016-03-23 DIAGNOSIS — R51 Headache: Principal | ICD-10-CM

## 2016-03-31 DIAGNOSIS — H468 Other optic neuritis: Secondary | ICD-10-CM | POA: Diagnosis not present

## 2016-03-31 DIAGNOSIS — H538 Other visual disturbances: Secondary | ICD-10-CM | POA: Diagnosis not present

## 2016-03-31 DIAGNOSIS — L930 Discoid lupus erythematosus: Secondary | ICD-10-CM | POA: Diagnosis not present

## 2016-04-03 ENCOUNTER — Ambulatory Visit
Admission: RE | Admit: 2016-04-03 | Discharge: 2016-04-03 | Disposition: A | Discharge status: Home / Self Care | Payer: Medicare Other | Source: Ambulatory Visit | Attending: Internal Medicine | Admitting: Internal Medicine

## 2016-04-03 ENCOUNTER — Other Ambulatory Visit: Payer: Medicare Other

## 2016-04-03 DIAGNOSIS — R519 Headache, unspecified: Secondary | ICD-10-CM

## 2016-04-03 DIAGNOSIS — R51 Headache: Secondary | ICD-10-CM | POA: Diagnosis not present

## 2016-04-03 MED ORDER — GADOBENATE DIMEGLUMINE 529 MG/ML IV SOLN
10.0000 mL | Freq: Once | INTRAVENOUS | Status: AC | PRN
  Administered 2016-04-03: 10 mL via INTRAVENOUS

## 2016-04-07 ENCOUNTER — Other Ambulatory Visit: Payer: Medicare Other

## 2016-06-09 DIAGNOSIS — M3210 Systemic lupus erythematosus, organ or system involvement unspecified: Secondary | ICD-10-CM | POA: Diagnosis not present

## 2016-06-09 DIAGNOSIS — E119 Type 2 diabetes mellitus without complications: Secondary | ICD-10-CM | POA: Diagnosis not present

## 2016-06-09 DIAGNOSIS — Z23 Encounter for immunization: Secondary | ICD-10-CM | POA: Diagnosis not present

## 2016-06-09 DIAGNOSIS — I10 Essential (primary) hypertension: Secondary | ICD-10-CM | POA: Diagnosis not present

## 2016-06-09 DIAGNOSIS — M79601 Pain in right arm: Secondary | ICD-10-CM | POA: Diagnosis not present

## 2016-06-09 DIAGNOSIS — M810 Age-related osteoporosis without current pathological fracture: Secondary | ICD-10-CM | POA: Diagnosis not present

## 2016-06-09 DIAGNOSIS — Z6826 Body mass index (BMI) 26.0-26.9, adult: Secondary | ICD-10-CM | POA: Diagnosis not present

## 2016-06-09 DIAGNOSIS — E784 Other hyperlipidemia: Secondary | ICD-10-CM | POA: Diagnosis not present

## 2016-06-09 DIAGNOSIS — E038 Other specified hypothyroidism: Secondary | ICD-10-CM | POA: Diagnosis not present

## 2016-06-09 DIAGNOSIS — N183 Chronic kidney disease, stage 3 (moderate): Secondary | ICD-10-CM | POA: Diagnosis not present

## 2016-06-09 DIAGNOSIS — M19011 Primary osteoarthritis, right shoulder: Secondary | ICD-10-CM | POA: Diagnosis not present

## 2016-08-23 DIAGNOSIS — K209 Esophagitis, unspecified: Secondary | ICD-10-CM | POA: Diagnosis not present

## 2016-08-23 DIAGNOSIS — K208 Other esophagitis: Secondary | ICD-10-CM | POA: Diagnosis not present

## 2016-08-29 DIAGNOSIS — R1319 Other dysphagia: Secondary | ICD-10-CM | POA: Diagnosis not present

## 2016-08-29 DIAGNOSIS — Z6825 Body mass index (BMI) 25.0-25.9, adult: Secondary | ICD-10-CM | POA: Diagnosis not present

## 2016-08-29 DIAGNOSIS — Z1389 Encounter for screening for other disorder: Secondary | ICD-10-CM | POA: Diagnosis not present

## 2016-08-29 DIAGNOSIS — M25519 Pain in unspecified shoulder: Secondary | ICD-10-CM | POA: Diagnosis not present

## 2016-10-05 DIAGNOSIS — Z1211 Encounter for screening for malignant neoplasm of colon: Secondary | ICD-10-CM | POA: Diagnosis not present

## 2016-10-05 DIAGNOSIS — R1310 Dysphagia, unspecified: Secondary | ICD-10-CM | POA: Diagnosis not present

## 2016-10-16 DIAGNOSIS — K228 Other specified diseases of esophagus: Secondary | ICD-10-CM | POA: Diagnosis not present

## 2016-10-16 DIAGNOSIS — R1310 Dysphagia, unspecified: Secondary | ICD-10-CM | POA: Diagnosis not present

## 2016-10-17 DIAGNOSIS — Z1211 Encounter for screening for malignant neoplasm of colon: Secondary | ICD-10-CM | POA: Diagnosis not present

## 2016-10-17 DIAGNOSIS — Z1212 Encounter for screening for malignant neoplasm of rectum: Secondary | ICD-10-CM | POA: Diagnosis not present

## 2016-11-14 DIAGNOSIS — Z1211 Encounter for screening for malignant neoplasm of colon: Secondary | ICD-10-CM | POA: Diagnosis not present

## 2016-11-14 DIAGNOSIS — K200 Eosinophilic esophagitis: Secondary | ICD-10-CM | POA: Diagnosis not present

## 2016-11-14 DIAGNOSIS — K219 Gastro-esophageal reflux disease without esophagitis: Secondary | ICD-10-CM | POA: Diagnosis not present

## 2016-11-20 DIAGNOSIS — Z1211 Encounter for screening for malignant neoplasm of colon: Secondary | ICD-10-CM | POA: Diagnosis not present

## 2016-12-12 DIAGNOSIS — E784 Other hyperlipidemia: Secondary | ICD-10-CM | POA: Diagnosis not present

## 2016-12-12 DIAGNOSIS — M810 Age-related osteoporosis without current pathological fracture: Secondary | ICD-10-CM | POA: Diagnosis not present

## 2016-12-12 DIAGNOSIS — E038 Other specified hypothyroidism: Secondary | ICD-10-CM | POA: Diagnosis not present

## 2016-12-12 DIAGNOSIS — I10 Essential (primary) hypertension: Secondary | ICD-10-CM | POA: Diagnosis not present

## 2016-12-12 DIAGNOSIS — E119 Type 2 diabetes mellitus without complications: Secondary | ICD-10-CM | POA: Diagnosis not present

## 2016-12-18 DIAGNOSIS — N183 Chronic kidney disease, stage 3 (moderate): Secondary | ICD-10-CM | POA: Diagnosis not present

## 2016-12-18 DIAGNOSIS — K200 Eosinophilic esophagitis: Secondary | ICD-10-CM | POA: Diagnosis not present

## 2016-12-18 DIAGNOSIS — M3210 Systemic lupus erythematosus, organ or system involvement unspecified: Secondary | ICD-10-CM | POA: Diagnosis not present

## 2016-12-18 DIAGNOSIS — E784 Other hyperlipidemia: Secondary | ICD-10-CM | POA: Diagnosis not present

## 2016-12-18 DIAGNOSIS — Z0000 Encounter for general adult medical examination without abnormal findings: Secondary | ICD-10-CM | POA: Diagnosis not present

## 2016-12-18 DIAGNOSIS — Z6825 Body mass index (BMI) 25.0-25.9, adult: Secondary | ICD-10-CM | POA: Diagnosis not present

## 2016-12-18 DIAGNOSIS — Z23 Encounter for immunization: Secondary | ICD-10-CM | POA: Diagnosis not present

## 2016-12-18 DIAGNOSIS — I10 Essential (primary) hypertension: Secondary | ICD-10-CM | POA: Diagnosis not present

## 2016-12-18 DIAGNOSIS — E038 Other specified hypothyroidism: Secondary | ICD-10-CM | POA: Diagnosis not present

## 2016-12-18 DIAGNOSIS — Z1389 Encounter for screening for other disorder: Secondary | ICD-10-CM | POA: Diagnosis not present

## 2016-12-18 DIAGNOSIS — M810 Age-related osteoporosis without current pathological fracture: Secondary | ICD-10-CM | POA: Diagnosis not present

## 2016-12-18 DIAGNOSIS — E119 Type 2 diabetes mellitus without complications: Secondary | ICD-10-CM | POA: Diagnosis not present

## 2017-04-11 DIAGNOSIS — N181 Chronic kidney disease, stage 1: Secondary | ICD-10-CM | POA: Diagnosis not present

## 2017-04-11 DIAGNOSIS — D631 Anemia in chronic kidney disease: Secondary | ICD-10-CM | POA: Diagnosis not present

## 2017-04-11 DIAGNOSIS — E1122 Type 2 diabetes mellitus with diabetic chronic kidney disease: Secondary | ICD-10-CM | POA: Diagnosis not present

## 2017-04-11 DIAGNOSIS — R809 Proteinuria, unspecified: Secondary | ICD-10-CM | POA: Diagnosis not present

## 2017-04-11 DIAGNOSIS — M329 Systemic lupus erythematosus, unspecified: Secondary | ICD-10-CM | POA: Diagnosis not present

## 2017-04-11 DIAGNOSIS — E611 Iron deficiency: Secondary | ICD-10-CM | POA: Diagnosis not present

## 2017-04-19 DIAGNOSIS — L930 Discoid lupus erythematosus: Secondary | ICD-10-CM | POA: Diagnosis not present

## 2017-04-19 DIAGNOSIS — M5416 Radiculopathy, lumbar region: Secondary | ICD-10-CM | POA: Diagnosis not present

## 2017-04-19 DIAGNOSIS — Z6826 Body mass index (BMI) 26.0-26.9, adult: Secondary | ICD-10-CM | POA: Diagnosis not present

## 2017-04-19 DIAGNOSIS — H468 Other optic neuritis: Secondary | ICD-10-CM | POA: Diagnosis not present

## 2017-04-19 DIAGNOSIS — H538 Other visual disturbances: Secondary | ICD-10-CM | POA: Diagnosis not present

## 2017-04-21 DIAGNOSIS — R079 Chest pain, unspecified: Secondary | ICD-10-CM | POA: Diagnosis not present

## 2017-04-26 ENCOUNTER — Telehealth: Payer: Self-pay | Admitting: Cardiology

## 2017-04-26 DIAGNOSIS — E7849 Other hyperlipidemia: Secondary | ICD-10-CM | POA: Diagnosis not present

## 2017-04-26 DIAGNOSIS — Z6826 Body mass index (BMI) 26.0-26.9, adult: Secondary | ICD-10-CM | POA: Diagnosis not present

## 2017-04-26 DIAGNOSIS — I10 Essential (primary) hypertension: Secondary | ICD-10-CM | POA: Diagnosis not present

## 2017-04-26 DIAGNOSIS — K219 Gastro-esophageal reflux disease without esophagitis: Secondary | ICD-10-CM | POA: Diagnosis not present

## 2017-04-26 DIAGNOSIS — R0789 Other chest pain: Secondary | ICD-10-CM | POA: Diagnosis not present

## 2017-04-26 DIAGNOSIS — R0609 Other forms of dyspnea: Secondary | ICD-10-CM | POA: Diagnosis not present

## 2017-04-26 NOTE — Telephone Encounter (Signed)
[facility] Dr SwazilandJordan.  Records put with Dr Elvis CoilJordan's schedule for 04/27/17.  lp

## 2017-04-27 ENCOUNTER — Encounter: Payer: Self-pay | Admitting: Cardiology

## 2017-04-27 ENCOUNTER — Ambulatory Visit (INDEPENDENT_AMBULATORY_CARE_PROVIDER_SITE_OTHER): Payer: Medicare Other | Admitting: Cardiology

## 2017-04-27 VITALS — BP 144/80 | HR 74 | Ht 59.0 in | Wt 125.0 lb

## 2017-04-27 DIAGNOSIS — R0789 Other chest pain: Secondary | ICD-10-CM | POA: Diagnosis not present

## 2017-04-27 DIAGNOSIS — R079 Chest pain, unspecified: Secondary | ICD-10-CM | POA: Diagnosis not present

## 2017-04-27 DIAGNOSIS — I10 Essential (primary) hypertension: Secondary | ICD-10-CM | POA: Diagnosis not present

## 2017-04-27 DIAGNOSIS — E7800 Pure hypercholesterolemia, unspecified: Secondary | ICD-10-CM

## 2017-04-27 NOTE — Progress Notes (Signed)
Cardiology Office Note   Date:  04/27/2017   ID:  Veronica, Jordan 04/24/1946, MRN [id number]  PCP:  Veronica Clan, MD  Cardiologist:   Veronica Geers [place], MD   Chief Complaint  Patient presents with  . New Patient (Initial Visit)  . Chest Pain      History of Present Illness: Veronica Jordan is a 71 y.o. female who is seen at the request of Veronica Jordan for evaluation of chest pain.  Prior cardiac evaluation includes a normal Echo in 2001 and a normal Adenosine myoview study in 2009. She states she has had 3 episodes of sharp chest pain in the last year. Last episode was 2 weeks ago. Pain radiates from left shoulder across chest and into her nipples. Describes pain as excruciating or extreme. Lasted 5 minutes then resolve. She does complain of increased DOE walking up stairs or hills. Reports DM, HLD, HTN have been under control but ran out of losartan this week. She has multiple cardiac risk factors as noted.    Past Medical History:  Diagnosis Date  . Allergic rhinitis   . Anemia   . Anxiety and depression   . Depression   . Diabetes mellitus   . Diverticulitis   . Fibromyalgia   . GERD (gastroesophageal reflux disease)   . History of gallstones   . History of kidney stones   . Hyperlipemia   . Hypertension   . Hypothyroidism   . IBS (irritable bowel syndrome)   . Osteoarthritis   . Osteoporosis   . Renal calculi   . Sjogren's syndrome (HCC)   . Systemic lupus (HCC) 2001    Past Surgical History:  Procedure Laterality Date  . APPENDECTOMY  1970  . BREAST BIOPSY    . CATARACT EXTRACTION     bilateral  . CESAREAN SECTION  1976, 1977   x2  . CHOLECYSTECTOMY  1988  . TOTAL ABDOMINAL HYSTERECTOMY  2000   BSO due to fibroids     Current Outpatient Prescriptions  Medication Sig Dispense Refill  . amLODipine (NORVASC) 10 MG tablet Take 10 mg by mouth daily.      Marland Kitchen aspirin 81 MG tablet Take 81 mg by mouth daily.      . Calcium Carbonate-Vitamin D (CALTRATE  600+D) 600-400 MG-UNIT per tablet Take 1 tablet by mouth daily.    . Coenzyme Q10 (COQ-10) 50 MG CAPS Take 1 capsule by mouth daily.      . cycloSPORINE (RESTASIS) 0.05 % ophthalmic emulsion 1 drop daily.      . Folic Acid-Vit B6-Vit B12 (FOLBEE) 2.5-25-1 MG TABS Take 1 tablet by mouth daily.    Marland Kitchen levothyroxine (SYNTHROID, LEVOTHROID) 25 MCG tablet Take 25 mcg by mouth daily.      Marland Kitchen losartan (COZAAR) 100 MG tablet Take 100 mg by mouth daily.    . simvastatin (ZOCOR) 40 MG tablet Take 20 mg by mouth at bedtime.     . DULoxetine (CYMBALTA) 60 MG capsule TK ONE C PO ONCE A DAY FOR 7 DAYS  2   No current facility-administered medications for this visit.     Allergies:   Clindamycin/lincomycin; Codeine; Erythromycin; Meperidine hcl; Ondansetron hcl; Penicillins; Sulfonamide derivatives; and Zofran [ondansetron hcl]    Social History:  The patient  reports that she has never smoked. She has never used smokeless tobacco. She reports that she does not drink alcohol or use drugs.   Family History:  The patient's family history includes Asthma in  her mother and sister; COPD in her mother; Heart disease in her father; Parkinsonism in her unknown relative.    ROS:  Please see the history of present illness.   Otherwise, review of systems are positive for none.   All other systems are reviewed and negative.    PHYSICAL EXAM: VS:  BP (!) 144/80   Pulse 74   Ht 4\' 11"  (1.499 m)   Wt 125 lb (56.7 kg)   LMP 07/03/1998 (Approximate)   BMI 25.25 kg/m  , BMI Body mass index is 25.25 kg/m. GEN: Well nourished, well developed, in no acute distress  HEENT: normal  Neck: no JVD, carotid bruits, or masses Cardiac: RRR; no murmurs, rubs, or gallops,no edema  Respiratory:  clear to auscultation bilaterally, normal work of breathing GI: soft, nontender, nondistended, + BS MS: no deformity or atrophy  Skin: warm and dry, no rash Neuro:  Strength and sensation are intact Psych: euthymic mood, full  affect   EKG:  EKG is ordered today. The ekg ordered today demonstrates NSR with minor nonspecific ST abnormality. I have personally reviewed and interpreted this study.    Recent Labs: No results found for requested labs within last 8760 hours.    Lipid Panel No results found for: CHOL, TRIG, HDL, CHOLHDL, VLDL, LDLCALC, LDLDIRECT    Wt Readings from Last 3 Encounters:  04/27/17 125 lb (56.7 kg)  03/05/15 125 lb (56.7 kg)  11/06/12 125 lb 6.4 oz (56.9 kg)      Other studies Reviewed: Additional studies/ records that were reviewed today include:  Labs dated 12/12/16: CBC and CMET normal. Cholesterol 180, triglycerides 121, HDL 50, LDL 106. TFTs normal.  Ecg dated 05/26/14: NSR rate 52. Anterior T wave inversion V1-3. CXR 10/14/12: NAD   ASSESSMENT AND PLAN:  1. Chest pain with moderate risk for CAD with multiple risk factors. Also notes symptoms of DOE. Recommend evaluation with nuclear stress test.  2. DM type 2  3. HTN  4. HLD.  Current medicines are reviewed at length with the patient today.  The patient does not have concerns regarding medicines.  The following changes have been made:  no change  Labs/ tests ordered today include:   Orders Placed This Encounter  Procedures  . Myocardial Perfusion Imaging  . EKG 12-Lead     Disposition:   FU TBD based on stress test results.   Signed, Veronica Qu Swaz[place]Jordan, MD  04/27/2017 3:18 PM    [facility] [address], [place], [place], [id number] Phone [phone], Fax [phone]

## 2017-04-27 NOTE — Patient Instructions (Addendum)
We will schedule you for a nuclear stress test Your follow will be determined by the results of your test

## 2017-05-01 ENCOUNTER — Telehealth (HOSPITAL_COMMUNITY): Payer: Self-pay

## 2017-05-01 NOTE — Telephone Encounter (Signed)
Encounter complete. 

## 2017-05-02 ENCOUNTER — Ambulatory Visit (HOSPITAL_COMMUNITY)
Admission: RE | Admit: 2017-05-02 | Discharge: 2017-05-02 | Disposition: A | Discharge status: Home / Self Care | Payer: Medicare Other | Source: Ambulatory Visit | Attending: Cardiology | Admitting: Cardiology

## 2017-05-02 DIAGNOSIS — R0609 Other forms of dyspnea: Secondary | ICD-10-CM | POA: Insufficient documentation

## 2017-05-02 DIAGNOSIS — E119 Type 2 diabetes mellitus without complications: Secondary | ICD-10-CM | POA: Diagnosis not present

## 2017-05-02 DIAGNOSIS — R079 Chest pain, unspecified: Secondary | ICD-10-CM

## 2017-05-02 DIAGNOSIS — I10 Essential (primary) hypertension: Secondary | ICD-10-CM | POA: Insufficient documentation

## 2017-05-02 DIAGNOSIS — E0590 Thyrotoxicosis, unspecified without thyrotoxic crisis or storm: Secondary | ICD-10-CM | POA: Diagnosis not present

## 2017-05-02 DIAGNOSIS — Z8249 Family history of ischemic heart disease and other diseases of the circulatory system: Secondary | ICD-10-CM | POA: Diagnosis not present

## 2017-05-02 LAB — MYOCARDIAL PERFUSION IMAGING
CHL CUP MPHR: 151 {beats}/min
CHL CUP NUCLEAR SDS: 2
CSEPED: 6 min
CSEPPHR: 137 {beats}/min
Estimated workload: 6.4 METS
Exercise duration (sec): 1 s
LV dias vol: 47 mL (ref 46–106)
LV sys vol: 15 mL
NUC STRESS TID: 1.11
Percent HR: 90 %
RPE: 18
Rest HR: 73 {beats}/min
SRS: 6
SSS: 8

## 2017-05-02 MED ORDER — TECHNETIUM TC 99M TETROFOSMIN IV KIT
32.1000 | PACK | Freq: Once | INTRAVENOUS | Status: AC | PRN
  Administered 2017-05-02: 32.1 via INTRAVENOUS
  Filled 2017-05-02: qty 33

## 2017-05-02 MED ORDER — TECHNETIUM TC 99M TETROFOSMIN IV KIT
10.5000 | PACK | Freq: Once | INTRAVENOUS | Status: AC | PRN
  Administered 2017-05-02: 10.5 via INTRAVENOUS
  Filled 2017-05-02: qty 11

## 2017-07-12 DIAGNOSIS — Z23 Encounter for immunization: Secondary | ICD-10-CM | POA: Diagnosis not present

## 2017-07-12 DIAGNOSIS — J3489 Other specified disorders of nose and nasal sinuses: Secondary | ICD-10-CM | POA: Diagnosis not present

## 2017-07-12 DIAGNOSIS — F338 Other recurrent depressive disorders: Secondary | ICD-10-CM | POA: Diagnosis not present

## 2017-07-12 DIAGNOSIS — Z6826 Body mass index (BMI) 26.0-26.9, adult: Secondary | ICD-10-CM | POA: Diagnosis not present

## 2017-07-12 DIAGNOSIS — E119 Type 2 diabetes mellitus without complications: Secondary | ICD-10-CM | POA: Diagnosis not present

## 2017-08-16 DIAGNOSIS — Z6826 Body mass index (BMI) 26.0-26.9, adult: Secondary | ICD-10-CM | POA: Diagnosis not present

## 2017-08-16 DIAGNOSIS — J3489 Other specified disorders of nose and nasal sinuses: Secondary | ICD-10-CM | POA: Diagnosis not present

## 2017-08-16 DIAGNOSIS — G250 Essential tremor: Secondary | ICD-10-CM | POA: Diagnosis not present

## 2017-08-16 DIAGNOSIS — F3341 Major depressive disorder, recurrent, in partial remission: Secondary | ICD-10-CM | POA: Diagnosis not present

## 2017-10-16 DIAGNOSIS — Z6825 Body mass index (BMI) 25.0-25.9, adult: Secondary | ICD-10-CM | POA: Diagnosis not present

## 2017-10-16 DIAGNOSIS — J069 Acute upper respiratory infection, unspecified: Secondary | ICD-10-CM | POA: Diagnosis not present

## 2018-02-05 DIAGNOSIS — E119 Type 2 diabetes mellitus without complications: Secondary | ICD-10-CM | POA: Diagnosis not present

## 2018-02-05 DIAGNOSIS — E038 Other specified hypothyroidism: Secondary | ICD-10-CM | POA: Diagnosis not present

## 2018-02-05 DIAGNOSIS — E7849 Other hyperlipidemia: Secondary | ICD-10-CM | POA: Diagnosis not present

## 2018-02-05 DIAGNOSIS — R82998 Other abnormal findings in urine: Secondary | ICD-10-CM | POA: Diagnosis not present

## 2018-02-05 DIAGNOSIS — I10 Essential (primary) hypertension: Secondary | ICD-10-CM | POA: Diagnosis not present

## 2018-02-05 DIAGNOSIS — M810 Age-related osteoporosis without current pathological fracture: Secondary | ICD-10-CM | POA: Diagnosis not present

## 2018-02-12 DIAGNOSIS — G250 Essential tremor: Secondary | ICD-10-CM | POA: Diagnosis not present

## 2018-02-12 DIAGNOSIS — E1149 Type 2 diabetes mellitus with other diabetic neurological complication: Secondary | ICD-10-CM | POA: Diagnosis not present

## 2018-02-12 DIAGNOSIS — G6289 Other specified polyneuropathies: Secondary | ICD-10-CM | POA: Diagnosis not present

## 2018-02-12 DIAGNOSIS — Z0000 Encounter for general adult medical examination without abnormal findings: Secondary | ICD-10-CM | POA: Diagnosis not present

## 2018-02-12 DIAGNOSIS — K209 Esophagitis, unspecified: Secondary | ICD-10-CM | POA: Diagnosis not present

## 2018-02-12 DIAGNOSIS — E7849 Other hyperlipidemia: Secondary | ICD-10-CM | POA: Diagnosis not present

## 2018-02-12 DIAGNOSIS — M3210 Systemic lupus erythematosus, organ or system involvement unspecified: Secondary | ICD-10-CM | POA: Diagnosis not present

## 2018-02-12 DIAGNOSIS — F338 Other recurrent depressive disorders: Secondary | ICD-10-CM | POA: Diagnosis not present

## 2018-02-12 DIAGNOSIS — I10 Essential (primary) hypertension: Secondary | ICD-10-CM | POA: Diagnosis not present

## 2018-02-12 DIAGNOSIS — Z6825 Body mass index (BMI) 25.0-25.9, adult: Secondary | ICD-10-CM | POA: Diagnosis not present

## 2018-02-12 DIAGNOSIS — L309 Dermatitis, unspecified: Secondary | ICD-10-CM | POA: Diagnosis not present

## 2018-02-12 DIAGNOSIS — N183 Chronic kidney disease, stage 3 (moderate): Secondary | ICD-10-CM | POA: Diagnosis not present

## 2018-02-13 DIAGNOSIS — Z1212 Encounter for screening for malignant neoplasm of rectum: Secondary | ICD-10-CM | POA: Diagnosis not present

## 2018-02-18 DIAGNOSIS — M810 Age-related osteoporosis without current pathological fracture: Secondary | ICD-10-CM | POA: Diagnosis not present

## 2018-02-20 ENCOUNTER — Other Ambulatory Visit: Payer: Self-pay | Admitting: Internal Medicine

## 2018-02-20 DIAGNOSIS — Z1231 Encounter for screening mammogram for malignant neoplasm of breast: Secondary | ICD-10-CM

## 2018-03-07 DIAGNOSIS — N390 Urinary tract infection, site not specified: Secondary | ICD-10-CM | POA: Diagnosis not present

## 2018-03-07 DIAGNOSIS — M329 Systemic lupus erythematosus, unspecified: Secondary | ICD-10-CM | POA: Diagnosis not present

## 2018-03-07 DIAGNOSIS — D631 Anemia in chronic kidney disease: Secondary | ICD-10-CM | POA: Diagnosis not present

## 2018-03-07 DIAGNOSIS — N181 Chronic kidney disease, stage 1: Secondary | ICD-10-CM | POA: Diagnosis not present

## 2018-03-07 DIAGNOSIS — E1122 Type 2 diabetes mellitus with diabetic chronic kidney disease: Secondary | ICD-10-CM | POA: Diagnosis not present

## 2018-03-07 DIAGNOSIS — E611 Iron deficiency: Secondary | ICD-10-CM | POA: Diagnosis not present

## 2018-03-07 DIAGNOSIS — R809 Proteinuria, unspecified: Secondary | ICD-10-CM | POA: Diagnosis not present

## 2018-03-15 ENCOUNTER — Ambulatory Visit: Payer: Medicare Other

## 2018-04-09 ENCOUNTER — Ambulatory Visit
Admission: RE | Admit: 2018-04-09 | Discharge: 2018-04-09 | Disposition: A | Discharge status: Home / Self Care | Payer: Medicare Other | Source: Ambulatory Visit | Attending: Internal Medicine | Admitting: Internal Medicine

## 2018-04-09 DIAGNOSIS — Z1231 Encounter for screening mammogram for malignant neoplasm of breast: Secondary | ICD-10-CM

## 2018-04-19 DIAGNOSIS — H538 Other visual disturbances: Secondary | ICD-10-CM | POA: Diagnosis not present

## 2018-04-19 DIAGNOSIS — L930 Discoid lupus erythematosus: Secondary | ICD-10-CM | POA: Diagnosis not present

## 2018-04-19 DIAGNOSIS — H468 Other optic neuritis: Secondary | ICD-10-CM | POA: Diagnosis not present

## 2018-04-19 DIAGNOSIS — Z961 Presence of intraocular lens: Secondary | ICD-10-CM | POA: Diagnosis not present

## 2018-05-16 DIAGNOSIS — Z6826 Body mass index (BMI) 26.0-26.9, adult: Secondary | ICD-10-CM | POA: Diagnosis not present

## 2018-05-16 DIAGNOSIS — M654 Radial styloid tenosynovitis [de Quervain]: Secondary | ICD-10-CM | POA: Diagnosis not present

## 2018-05-16 DIAGNOSIS — R768 Other specified abnormal immunological findings in serum: Secondary | ICD-10-CM | POA: Diagnosis not present

## 2018-05-16 DIAGNOSIS — M25532 Pain in left wrist: Secondary | ICD-10-CM | POA: Diagnosis not present

## 2018-05-27 DIAGNOSIS — R112 Nausea with vomiting, unspecified: Secondary | ICD-10-CM | POA: Diagnosis present

## 2018-05-27 DIAGNOSIS — Z79899 Other long term (current) drug therapy: Secondary | ICD-10-CM | POA: Diagnosis not present

## 2018-05-27 DIAGNOSIS — R1900 Intra-abdominal and pelvic swelling, mass and lump, unspecified site: Secondary | ICD-10-CM | POA: Diagnosis not present

## 2018-05-27 DIAGNOSIS — F419 Anxiety disorder, unspecified: Secondary | ICD-10-CM | POA: Diagnosis present

## 2018-05-27 DIAGNOSIS — E785 Hyperlipidemia, unspecified: Secondary | ICD-10-CM | POA: Diagnosis present

## 2018-05-27 DIAGNOSIS — E039 Hypothyroidism, unspecified: Secondary | ICD-10-CM | POA: Diagnosis present

## 2018-05-27 DIAGNOSIS — R197 Diarrhea, unspecified: Secondary | ICD-10-CM | POA: Diagnosis present

## 2018-05-27 DIAGNOSIS — F329 Major depressive disorder, single episode, unspecified: Secondary | ICD-10-CM | POA: Diagnosis present

## 2018-05-27 DIAGNOSIS — R7989 Other specified abnormal findings of blood chemistry: Secondary | ICD-10-CM | POA: Diagnosis not present

## 2018-05-27 DIAGNOSIS — R1111 Vomiting without nausea: Secondary | ICD-10-CM | POA: Diagnosis not present

## 2018-05-27 DIAGNOSIS — Z7982 Long term (current) use of aspirin: Secondary | ICD-10-CM | POA: Diagnosis not present

## 2018-05-27 DIAGNOSIS — I10 Essential (primary) hypertension: Secondary | ICD-10-CM | POA: Diagnosis present

## 2018-05-27 DIAGNOSIS — Z2821 Immunization not carried out because of patient refusal: Secondary | ICD-10-CM | POA: Diagnosis not present

## 2018-05-27 DIAGNOSIS — E860 Dehydration: Secondary | ICD-10-CM | POA: Diagnosis present

## 2018-05-27 DIAGNOSIS — E872 Acidosis: Secondary | ICD-10-CM | POA: Diagnosis present

## 2018-05-27 DIAGNOSIS — D72829 Elevated white blood cell count, unspecified: Secondary | ICD-10-CM | POA: Diagnosis present

## 2018-05-27 DIAGNOSIS — R110 Nausea: Secondary | ICD-10-CM | POA: Diagnosis not present

## 2018-06-04 DIAGNOSIS — D72829 Elevated white blood cell count, unspecified: Secondary | ICD-10-CM | POA: Diagnosis not present

## 2018-06-04 DIAGNOSIS — Z6825 Body mass index (BMI) 25.0-25.9, adult: Secondary | ICD-10-CM | POA: Diagnosis not present

## 2018-06-04 DIAGNOSIS — M1380 Other specified arthritis, unspecified site: Secondary | ICD-10-CM | POA: Diagnosis not present

## 2018-06-04 DIAGNOSIS — A084 Viral intestinal infection, unspecified: Secondary | ICD-10-CM | POA: Diagnosis not present

## 2018-07-12 NOTE — Progress Notes (Signed)
Office Visit Note  Patient: Veronica Jordan             Date of Birth: [birth year]           MRN: [id number]             PCP: Martha Clan, MD Referring: Martha Clan, MD Visit Date: 07/24/2018 Occupation: @GUAROCC @  Subjective:  Pain in hands.   History of Present Illness: Veronica Jordan is a 73 y.o. female history of systemic lupus erythematosus.  She returns today after her last visit more than 3 years ago.  She has known history of systemic lupus, Sjogren's, optic neuritis and nephritis.  She was under my care for many years and was treated with Plaquenil and prednisone.  She was also under care of Dr. Rich Reining.  Patient states she continues to see her nephrologist.  She moved to aspirin last follow-up here.  She has had no relapse of nephritis or optic neuritis.  She has been off Plaquenil since 2016.  She has been experiencing pain in her left hand for the last 3 months.  She states she was seen by Dr. Clelia Croft who prescribed topical cream and its improved.  Does have some generalized pain but none of the particular joints are painful.  There is no history of joint swelling.  Continues to have dry mouth and dry eye symptoms.  She has been experiencing lower back pain.  Which gets worse with activity.  Activities of Daily Living:  Patient reports morning stiffness for several hours.   Patient Reports nocturnal pain. Hands Difficulty dressing/grooming: Reports lower back pain Difficulty climbing stairs: Reports Difficulty getting out of chair: Reports Difficulty using hands for taps, buttons, cutlery, and/or writing: Denies  Review of Systems  Constitutional: Positive for fatigue. Negative for night sweats, weight gain and weight loss.  HENT: Positive for mouth dryness. Negative for mouth sores, trouble swallowing, trouble swallowing and nose dryness.   Eyes: Positive for dryness. Negative for pain, redness, itching and visual disturbance.  Respiratory: Negative for cough, shortness  of breath, wheezing and difficulty breathing.   Cardiovascular: Negative for chest pain, palpitations, hypertension, irregular heartbeat and swelling in legs/feet.  Gastrointestinal: Positive for constipation and diarrhea. Negative for abdominal pain, blood in stool, nausea and vomiting.  Endocrine: Positive for increased urination.  Genitourinary: Negative for painful urination, nocturia, pelvic pain and vaginal dryness.  Musculoskeletal: Positive for arthralgias, joint pain, joint swelling, morning stiffness and muscle tenderness. Negative for myalgias, muscle weakness and myalgias.  Skin: Negative for color change, rash, hair loss, redness, skin tightness, ulcers and sensitivity to sunlight.  Allergic/Immunologic: Negative for susceptible to infections.  Neurological: Negative for dizziness, light-headedness, headaches, memory loss, night sweats and weakness.  Hematological: Negative for bruising/bleeding tendency and swollen glands.  Psychiatric/Behavioral: Negative for depressed mood, confusion and sleep disturbance. The patient is not nervous/anxious.     PMFS History:  Patient Active Problem List   Diagnosis Date Noted  . Postmenopausal hormone replacement therapy 03/05/2015  . Osteoporosis 03/05/2015  . Atrophic vaginitis 03/05/2015  . HYPERTHYROIDISM 12/13/2007  . Hypercholesterolemia 12/13/2007  . Essential hypertension 12/13/2007  . ALLERGIC RHINITIS 12/13/2007  . LUPUS ERYTHEMATOSUS 12/13/2007  . DYSPNEA 12/13/2007    Past Medical History:  Diagnosis Date  . Allergic rhinitis   . Anemia   . Anxiety and depression   . Depression   . Diabetes mellitus   . Diverticulitis   . Fibromyalgia   . GERD (gastroesophageal reflux disease)   .  History of gallstones   . History of kidney stones   . Hyperlipemia   . Hypertension   . Hypothyroidism   . IBS (irritable bowel syndrome)   . Osteoarthritis   . Osteoporosis   . Renal calculi   . Sjogren's syndrome (HCC)   .  Systemic lupus (HCC) 2001    Family History  Problem Relation Age of Onset  . Asthma Mother   . COPD Mother   . Heart disease Father   . Asthma Sister   . Parkinsonism Other        MGM  . Hypertension Brother   . Heart attack Brother   . Arthritis Maternal Grandmother   . Heart disease Maternal Grandfather   . Lupus Paternal Grandmother   . Heart attack Paternal Grandfather   . Hypertension Sister   . Chronic fatigue Daughter   . Colon cancer Neg Hx   . Esophageal cancer Neg Hx   . Stomach cancer Neg Hx    Past Surgical History:  Procedure Laterality Date  . APPENDECTOMY  1970  . BREAST EXCISIONAL BIOPSY Left   . CATARACT EXTRACTION     bilateral  . CESAREAN SECTION  1976, 1977   x2  . CHOLECYSTECTOMY  1988  . TONSILLECTOMY AND ADENOIDECTOMY    . TOTAL ABDOMINAL HYSTERECTOMY  2000   BSO due to fibroids   Social History   Social History Narrative   Youngest daughter died in MVA.    There is no immunization history on file for this patient.   Objective: Vital Signs: BP (!) 141/70 (BP Location: Right Arm, Patient Position: Sitting, Cuff Size: Normal)   Pulse 62   Resp 13   Ht 4' 9.75" (1.467 m)   Wt 124 lb 6.4 oz (56.4 kg)   LMP 07/03/1998 (Approximate)   BMI 26.23 kg/m    Physical Exam Vitals signs and nursing note reviewed.  Constitutional:      Appearance: She is well-developed.  HENT:     Head: Normocephalic and atraumatic.  Eyes:     Conjunctiva/sclera: Conjunctivae normal.  Neck:     Musculoskeletal: Normal range of motion.  Cardiovascular:     Rate and Rhythm: Normal rate and regular rhythm.     Heart sounds: Normal heart sounds.  Pulmonary:     Effort: Pulmonary effort is normal.     Breath sounds: Normal breath sounds.  Abdominal:     General: Bowel sounds are normal.     Palpations: Abdomen is soft.  Lymphadenopathy:     Cervical: No cervical adenopathy.  Skin:    General: Skin is warm and dry.     Capillary Refill: Capillary refill  takes less than 2 seconds.  Neurological:     Mental Status: She is alert and oriented to person, place, and time.  Psychiatric:        Behavior: Behavior normal.      Musculoskeletal Exam: C-spine good range of motion.  She has discomfort and painful range of motion of her lumbar spine.  Shoulder joints elbow joints wrist joints with good range of motion with no synovitis.  She has thickening of bilateral CMC and DIP joints with no synovitis.  She has some limitation with range of motion of her hip joints.  Knee joints ankles MTPs PIPs been good range of motion.  She has discomfort range of motion of bilateral knee joints.  CDAI Exam: CDAI Score: Not documented Patient Global Assessment: Not documented; Provider Global Assessment: Not documented Swollen:  Not documented; Tender: Not documented Joint Exam   Not documented   There is currently no information documented on the homunculus. Go to the Rheumatology activity and complete the homunculus joint exam.  Investigation: No additional findings.  Imaging: Xr Hand 2 View Left  Result Date: 07/24/2018 CMC, PIP and DIP narrowing was noted.  No MCP, intercarpal radiocarpal joint space narrowing was noted.  No erosive changes were noted. Impression: These findings are consistent with osteoarthritis of the hand.  Xr Hand 2 View Right  Result Date: 07/24/2018 CMC, PIP and DIP narrowing was noted.  No MCP, intercarpal radiocarpal joint space narrowing was noted.  No erosive changes were noted. Impression: These findings are consistent with osteoarthritis of the hand.  Xr Knee 3 View Left  Result Date: 07/24/2018 Moderate medial compartment narrowing and moderate patellofemoral narrowing was noted.  No chondrocalcinosis was noted. Impression: These findings are consistent with moderate osteoarthritis and moderate chondromalacia patella.  Xr Knee 3 View Right  Result Date: 07/24/2018 Moderate medial compartment narrowing was noted.  Mild  patellofemoral narrowing was noted. Impression: These findings are consistent with moderate osteoarthritis and mild chondromalacia patella.  Xr Lumbar Spine 2-3 Views  Result Date: 07/24/2018 Facet joint arthropathy was noted.  Multilevel spondylosis with osteophytes was noted.  Calcification of abdominal aorta was noted.  T12-L1 narrowing was noted. Impression: These findings are consistent with multilevel spondylosis and facet joint arthropathy.   Recent Labs: No results found for: WBC, HGB, PLT, NA, K, CL, CO2, GLUCOSE, BUN, CREATININE, BILITOT, ALKPHOS, AST, ALT, PROT, ALBUMIN, CALCIUM, GFRAA, QFTBGOLD, QFTBGOLDPLUS  Speciality Comments: No specialty comments available.  Procedures:  No procedures performed Allergies: Clindamycin/lincomycin; Codeine; Erythromycin; Meperidine hcl; Ondansetron hcl; Penicillins; Sulfonamide derivatives; and Zofran [ondansetron hcl]   Assessment / Plan:     Visit Diagnoses: Other organ or system involvement in systemic lupus erythematosus (HCC) - 05/21/18: ANA+,dsDNA 2, RNP-, smith-, Ro+, La+, RF+-387, elevated sed rate-68previous pt-2016-patient has known history of systemic lupus with optic neuritis and nephritis.  She was under my care until 2016 and then lost follow-up.  She had been off medication since 2016.  She denies any history of joint swelling.  She has been experiencing some discomfort in her hands which she describes over the [facility] joints.  Her labs obtained by her PCP recently showed elevated sedimentation rate and elevated rheumatoid factor.  She continues to have some sicca symptoms.  She has positive Ro and positive antibody.- Plan: CBC with Differential/Platelet, COMPLETE METABOLIC PANEL WITH GFR, Sedimentation rate, ANA, C3 and C4, Urinalysis, Routine w reflex microscopic  High risk medication use - previously on PLQ 2016   History of nephritis-patient has been followed by [facility].  She states she had recent evaluation by  them.  History of optic neuritis-patient denies any recurrence.  Sjogren's syndrome with other organ involvement (HCC)-she continues to have sicca symptoms.  Fibromyalgia-she has generalized pain and discomfort with positive tender points.  Pain in both hands - Plan: XR Hand 2 View Right, XR Hand 2 View Left, clinical findings and x-ray findings are consistent with osteoarthritis of the hand.  Cyclic citrul peptide antibody, IgG  Primary osteoarthritis of both hands-she has bilateral CMC and DIP thickening.  She has noticed improvement in her [facility] joint after using topical anti-inflammatory.  Chronic pain of both knees -no synovitis was noted.  She complains of chronic knee joint pain.  Plan: XR KNEE 3 VIEW RIGHT, XR KNEE 3 VIEW LEFT.  The x-rays reveal moderate osteoarthritis  and moderate chondromalacia patella.  Chronic midline low back pain without sciatica -patient reports chronic lower back pain and discomfort after doing certain activities.  Plan: XR Lumbar Spine 2-3 Views.  Multilevel spondylosis and facet joint arthropathy was noted.  Essential hypertension-her systolic blood pressure was elevated.  Have advised her to monitor blood pressure closely.  Other medical problems are listed as follows:  History of hypothyroidism  History of type 2 diabetes mellitus  History of diverticulitis  History of kidney stones  History of gastroesophageal reflux (GERD)  History of IBS  History of hyperlipidemia  Anxiety and depression  History of osteoporosis - Preivously on reclast x3 2012-2014  Eosinophilic esophagitis  Essential tremor  History of peripheral neuropathy  Other fatigue - Plan: CK, TSH, Hepatitis B core antibody, IgM, Hepatitis B surface antigen, Hepatitis C antibody, Serum protein electrophoresis with reflex, IgG, IgA, IgM   Orders: Orders Placed This Encounter  Procedures  . XR KNEE 3 VIEW RIGHT  . XR KNEE 3 VIEW LEFT  . XR Hand 2 View Right  . XR Hand 2  View Left  . XR Lumbar Spine 2-3 Views  . CBC with Differential/Platelet  . COMPLETE METABOLIC PANEL WITH GFR  . CK  . TSH  . Sedimentation rate  . ANA  . C3 and C4  . Cyclic citrul peptide antibody, IgG  . Hepatitis B core antibody, IgM  . Hepatitis B surface antigen  . Hepatitis C antibody  . Serum protein electrophoresis with reflex  . IgG, IgA, IgM  . Urinalysis, Routine w reflex microscopic   No orders of the defined types were placed in this encounter.   Face-to-face time spent with patient was 50 minutes. Greater than 50% of time was spent in counseling and coordination of care.  Follow-Up Instructions: Return for SLE, OA, FMS.   Pollyann SavoyShaili Shamarr Faucett, MD  Note - This record has been created using Animal nutritionistDragon software.  Chart creation errors have been sought, but may not always  have been located. Such creation errors do not reflect on  the standard of medical care.

## 2018-07-24 ENCOUNTER — Ambulatory Visit (INDEPENDENT_AMBULATORY_CARE_PROVIDER_SITE_OTHER): Payer: Medicare Other

## 2018-07-24 ENCOUNTER — Encounter: Payer: Self-pay | Admitting: Rheumatology

## 2018-07-24 ENCOUNTER — Ambulatory Visit (INDEPENDENT_AMBULATORY_CARE_PROVIDER_SITE_OTHER): Payer: Medicare Other | Admitting: Rheumatology

## 2018-07-24 ENCOUNTER — Ambulatory Visit (INDEPENDENT_AMBULATORY_CARE_PROVIDER_SITE_OTHER): Payer: Self-pay

## 2018-07-24 VITALS — BP 141/70 | HR 62 | Resp 13 | Ht <= 58 in | Wt 124.4 lb

## 2018-07-24 DIAGNOSIS — F419 Anxiety disorder, unspecified: Secondary | ICD-10-CM

## 2018-07-24 DIAGNOSIS — Z8739 Personal history of other diseases of the musculoskeletal system and connective tissue: Secondary | ICD-10-CM

## 2018-07-24 DIAGNOSIS — M25562 Pain in left knee: Secondary | ICD-10-CM

## 2018-07-24 DIAGNOSIS — M3219 Other organ or system involvement in systemic lupus erythematosus: Secondary | ICD-10-CM | POA: Diagnosis not present

## 2018-07-24 DIAGNOSIS — M25561 Pain in right knee: Secondary | ICD-10-CM | POA: Diagnosis not present

## 2018-07-24 DIAGNOSIS — G8929 Other chronic pain: Secondary | ICD-10-CM

## 2018-07-24 DIAGNOSIS — F32A Depression, unspecified: Secondary | ICD-10-CM

## 2018-07-24 DIAGNOSIS — M19042 Primary osteoarthritis, left hand: Secondary | ICD-10-CM

## 2018-07-24 DIAGNOSIS — R5383 Other fatigue: Secondary | ICD-10-CM

## 2018-07-24 DIAGNOSIS — M79641 Pain in right hand: Secondary | ICD-10-CM

## 2018-07-24 DIAGNOSIS — M79642 Pain in left hand: Secondary | ICD-10-CM | POA: Diagnosis not present

## 2018-07-24 DIAGNOSIS — Z87448 Personal history of other diseases of urinary system: Secondary | ICD-10-CM

## 2018-07-24 DIAGNOSIS — Z8639 Personal history of other endocrine, nutritional and metabolic disease: Secondary | ICD-10-CM

## 2018-07-24 DIAGNOSIS — F329 Major depressive disorder, single episode, unspecified: Secondary | ICD-10-CM

## 2018-07-24 DIAGNOSIS — K200 Eosinophilic esophagitis: Secondary | ICD-10-CM

## 2018-07-24 DIAGNOSIS — M545 Low back pain: Secondary | ICD-10-CM

## 2018-07-24 DIAGNOSIS — G250 Essential tremor: Secondary | ICD-10-CM

## 2018-07-24 DIAGNOSIS — Z79899 Other long term (current) drug therapy: Secondary | ICD-10-CM

## 2018-07-24 DIAGNOSIS — I10 Essential (primary) hypertension: Secondary | ICD-10-CM

## 2018-07-24 DIAGNOSIS — Z8669 Personal history of other diseases of the nervous system and sense organs: Secondary | ICD-10-CM

## 2018-07-24 DIAGNOSIS — Z8719 Personal history of other diseases of the digestive system: Secondary | ICD-10-CM

## 2018-07-24 DIAGNOSIS — M19041 Primary osteoarthritis, right hand: Secondary | ICD-10-CM | POA: Diagnosis not present

## 2018-07-24 DIAGNOSIS — M3509 Sicca syndrome with other organ involvement: Secondary | ICD-10-CM | POA: Diagnosis not present

## 2018-07-24 DIAGNOSIS — Z87442 Personal history of urinary calculi: Secondary | ICD-10-CM

## 2018-07-24 DIAGNOSIS — M797 Fibromyalgia: Secondary | ICD-10-CM | POA: Diagnosis not present

## 2018-07-26 NOTE — Progress Notes (Signed)
Add ENA

## 2018-07-30 LAB — CBC WITH DIFFERENTIAL/PLATELET
Absolute Monocytes: 515 cells/uL (ref 200–950)
Basophils Absolute: 73 cells/uL (ref 0–200)
Basophils Relative: 1.4 %
Eosinophils Absolute: 203 cells/uL (ref 15–500)
Eosinophils Relative: 3.9 %
HCT: 35.8 % (ref 35.0–45.0)
HEMOGLOBIN: 11.9 g/dL (ref 11.7–15.5)
Lymphs Abs: 1399 cells/uL (ref 850–3900)
MCH: 29.3 pg (ref 27.0–33.0)
MCHC: 33.2 g/dL (ref 32.0–36.0)
MCV: 88.2 fL (ref 80.0–100.0)
MPV: 11.2 fL (ref 7.5–12.5)
Monocytes Relative: 9.9 %
NEUTROS ABS: 3011 {cells}/uL (ref 1500–7800)
Neutrophils Relative %: 57.9 %
Platelets: 221 10*3/uL (ref 140–400)
RBC: 4.06 10*6/uL (ref 3.80–5.10)
RDW: 12.5 % (ref 11.0–15.0)
Total Lymphocyte: 26.9 %
WBC: 5.2 10*3/uL (ref 3.8–10.8)

## 2018-07-30 LAB — COMPLETE METABOLIC PANEL WITH GFR
AG Ratio: 1.4 (calc) (ref 1.0–2.5)
ALT: 11 U/L (ref 6–29)
AST: 16 U/L (ref 10–35)
Albumin: 4.4 g/dL (ref 3.6–5.1)
Alkaline phosphatase (APISO): 102 U/L (ref 33–130)
BUN/Creatinine Ratio: 14 (calc) (ref 6–22)
BUN: 14 mg/dL (ref 7–25)
CO2: 25 mmol/L (ref 20–32)
CREATININE: 1 mg/dL — AB (ref 0.60–0.93)
Calcium: 9.4 mg/dL (ref 8.6–10.4)
Chloride: 103 mmol/L (ref 98–110)
GFR, Est African American: 66 mL/min/{1.73_m2} (ref >=60)
GFR, Est Non African American: 57 mL/min/{1.73_m2} — ABNORMAL LOW (ref >=60)
GLUCOSE: 129 mg/dL — AB (ref 65–99)
Globulin: 3.1 g/dL (calc) (ref 1.9–3.7)
Potassium: 3.7 mmol/L (ref 3.5–5.3)
Sodium: 139 mmol/L (ref 135–146)
Total Bilirubin: 0.4 mg/dL (ref 0.2–1.2)
Total Protein: 7.5 g/dL (ref 6.1–8.1)

## 2018-07-30 LAB — CYCLIC CITRUL PEPTIDE ANTIBODY, IGG

## 2018-07-30 LAB — URINALYSIS, ROUTINE W REFLEX MICROSCOPIC
Bilirubin Urine: NEGATIVE
Glucose, UA: NEGATIVE
Hgb urine dipstick: NEGATIVE
Leukocytes, UA: NEGATIVE
Nitrite: NEGATIVE
Protein, ur: NEGATIVE
Specific Gravity, Urine: 1.027 (ref 1.001–1.03)
pH: 5.5 (ref 5.0–8.0)

## 2018-07-30 LAB — IFE INTERPRETATION: Immunofix Electr Int: NOT DETECTED

## 2018-07-30 LAB — SJOGREN'S SYNDROME ANTIBODS(SSA + SSB)
SSA (Ro) (ENA) Antibody, IgG: >8 AI — AB
SSB (La) (ENA) Antibody, IgG: >8 AI — AB

## 2018-07-30 LAB — PROTEIN ELECTROPHORESIS, SERUM, WITH REFLEX
ALBUMIN ELP: 4 g/dL (ref 3.8–4.8)
Alpha 1: 0.3 g/dL (ref 0.2–0.3)
Alpha 2: 0.9 g/dL (ref 0.5–0.9)
BETA 2: 0.4 g/dL (ref 0.2–0.5)
Beta Globulin: 0.5 g/dL (ref 0.4–0.6)
Gamma Globulin: 1.3 g/dL (ref 0.8–1.7)
Total Protein: 7.3 g/dL (ref 6.1–8.1)

## 2018-07-30 LAB — IGG, IGA, IGM
IgG (Immunoglobin G), Serum: 1320 mg/dL (ref 600–1540)
IgM, Serum: 230 mg/dL (ref 50–300)
Immunoglobulin A: 200 mg/dL (ref 70–320)

## 2018-07-30 LAB — SEDIMENTATION RATE: Sed Rate: 36 mm/h — ABNORMAL HIGH (ref 0–30)

## 2018-07-30 LAB — TSH: TSH: 2.57 mIU/L (ref 0.40–4.50)

## 2018-07-30 LAB — SM AND SM/RNP ANTIBODIES
ENA SM Ab Ser-aCnc: <1 AI
SM/RNP: <1 AI

## 2018-07-30 LAB — HEPATITIS B CORE ANTIBODY, IGM: Hep B C IgM: NONREACTIVE

## 2018-07-30 LAB — TEST AUTHORIZATION

## 2018-07-30 LAB — ANA: Anti Nuclear Antibody(ANA): POSITIVE — AB

## 2018-07-30 LAB — ANTI-SCLERODERMA ANTIBODY: Scleroderma (Scl-70) (ENA) Antibody, IgG: <1 AI

## 2018-07-30 LAB — ANTI-NUCLEAR AB-TITER (ANA TITER): ANA Titer 1: 1:1280 {titer} — ABNORMAL HIGH

## 2018-07-30 LAB — C3 AND C4
C3 Complement: 166 mg/dL (ref 83–193)
C4 COMPLEMENT: 22 mg/dL (ref 15–57)

## 2018-07-30 LAB — HEPATITIS C ANTIBODY
Hepatitis C Ab: NONREACTIVE
SIGNAL TO CUT-OFF: 0.02 (ref <1.00)

## 2018-07-30 LAB — ANTI-DNA ANTIBODY, DOUBLE-STRANDED: ds DNA Ab: 2 IU/mL

## 2018-07-30 LAB — CK: Total CK: 56 U/L (ref 29–143)

## 2018-07-30 LAB — HEPATITIS B SURFACE ANTIGEN: Hepatitis B Surface Ag: NONREACTIVE

## 2018-07-30 NOTE — Progress Notes (Signed)
Patient has positive ANA, positive Ro and positive La antibody.  She will benefit by going on Plaquenil.  I will discuss results at the follow-up visit.

## 2018-08-07 DIAGNOSIS — Z8669 Personal history of other diseases of the nervous system and sense organs: Secondary | ICD-10-CM | POA: Insufficient documentation

## 2018-08-07 DIAGNOSIS — M170 Bilateral primary osteoarthritis of knee: Secondary | ICD-10-CM | POA: Insufficient documentation

## 2018-08-07 DIAGNOSIS — G250 Essential tremor: Secondary | ICD-10-CM | POA: Insufficient documentation

## 2018-08-07 DIAGNOSIS — K200 Eosinophilic esophagitis: Secondary | ICD-10-CM | POA: Insufficient documentation

## 2018-08-07 DIAGNOSIS — Z8719 Personal history of other diseases of the digestive system: Secondary | ICD-10-CM | POA: Insufficient documentation

## 2018-08-07 DIAGNOSIS — Z8639 Personal history of other endocrine, nutritional and metabolic disease: Secondary | ICD-10-CM | POA: Insufficient documentation

## 2018-08-07 DIAGNOSIS — M19041 Primary osteoarthritis, right hand: Secondary | ICD-10-CM | POA: Insufficient documentation

## 2018-08-07 DIAGNOSIS — Z87442 Personal history of urinary calculi: Secondary | ICD-10-CM | POA: Insufficient documentation

## 2018-08-07 DIAGNOSIS — F329 Major depressive disorder, single episode, unspecified: Secondary | ICD-10-CM | POA: Insufficient documentation

## 2018-08-07 DIAGNOSIS — E785 Hyperlipidemia, unspecified: Secondary | ICD-10-CM | POA: Insufficient documentation

## 2018-08-07 DIAGNOSIS — M797 Fibromyalgia: Secondary | ICD-10-CM | POA: Insufficient documentation

## 2018-08-07 DIAGNOSIS — M3501 Sicca syndrome with keratoconjunctivitis: Secondary | ICD-10-CM | POA: Insufficient documentation

## 2018-08-07 DIAGNOSIS — F419 Anxiety disorder, unspecified: Secondary | ICD-10-CM

## 2018-08-07 DIAGNOSIS — M5136 Other intervertebral disc degeneration, lumbar region: Secondary | ICD-10-CM | POA: Insufficient documentation

## 2018-08-07 DIAGNOSIS — Z87448 Personal history of other diseases of urinary system: Secondary | ICD-10-CM | POA: Insufficient documentation

## 2018-08-07 DIAGNOSIS — M19042 Primary osteoarthritis, left hand: Secondary | ICD-10-CM

## 2018-08-07 NOTE — Progress Notes (Deleted)
Office Visit Note  Patient: Veronica Jordan             Date of Birth: 19-Sep-1945           MRN: [id number]             PCP: Martha ClanShaw, William, MD Referring: Martha ClanShaw, William, MD Visit Date: 08/21/2018 Occupation: @GUAROCC @  Subjective:  No chief complaint on file.   History of Present Illness: Veronica Jordan is a 73 y.o. female ***   Activities of Daily Living:  Patient reports morning stiffness for *** {minute/hour:19697}.   Patient {ACTIONS;DENIES/REPORTS:21021675::"Denies"} nocturnal pain.  Difficulty dressing/grooming: {ACTIONS;DENIES/REPORTS:21021675::"Denies"} Difficulty climbing stairs: {ACTIONS;DENIES/REPORTS:21021675::"Denies"} Difficulty getting out of chair: {ACTIONS;DENIES/REPORTS:21021675::"Denies"} Difficulty using hands for taps, buttons, cutlery, and/or writing: {ACTIONS;DENIES/REPORTS:21021675::"Denies"}  No Rheumatology ROS completed.   PMFS History:  Patient Active Problem List   Diagnosis Date Noted  . Postmenopausal hormone replacement therapy 03/05/2015  . Osteoporosis 03/05/2015  . Atrophic vaginitis 03/05/2015  . HYPERTHYROIDISM 12/13/2007  . Hypercholesterolemia 12/13/2007  . Essential hypertension 12/13/2007  . ALLERGIC RHINITIS 12/13/2007  . LUPUS ERYTHEMATOSUS 12/13/2007  . DYSPNEA 12/13/2007    Past Medical History:  Diagnosis Date  . Allergic rhinitis   . Anemia   . Anxiety and depression   . Depression   . Diabetes mellitus   . Diverticulitis   . Fibromyalgia   . GERD (gastroesophageal reflux disease)   . History of gallstones   . History of kidney stones   . Hyperlipemia   . Hypertension   . Hypothyroidism   . IBS (irritable bowel syndrome)   . Osteoarthritis   . Osteoporosis   . Renal calculi   . Sjogren's syndrome (HCC)   . Systemic lupus (HCC) 2001    Family History  Problem Relation Age of Onset  . Asthma Mother   . COPD Mother   . Heart disease Father   . Asthma Sister   . Parkinsonism Other        MGM  .  Hypertension Brother   . Heart attack Brother   . Arthritis Maternal Grandmother   . Heart disease Maternal Grandfather   . Lupus Paternal Grandmother   . Heart attack Paternal Grandfather   . Hypertension Sister   . Chronic fatigue Daughter   . Colon cancer Neg Hx   . Esophageal cancer Neg Hx   . Stomach cancer Neg Hx    Past Surgical History:  Procedure Laterality Date  . APPENDECTOMY  1970  . BREAST EXCISIONAL BIOPSY Left   . CATARACT EXTRACTION     bilateral  . CESAREAN SECTION  1976, 1977   x2  . CHOLECYSTECTOMY  1988  . TONSILLECTOMY AND ADENOIDECTOMY    . TOTAL ABDOMINAL HYSTERECTOMY  2000   BSO due to fibroids   Social History   Social History Narrative   Youngest daughter died in MVA.    There is no immunization history on file for this patient.   Objective: Vital Signs: LMP 07/03/1998 (Approximate)    Physical Exam   Musculoskeletal Exam: ***  CDAI Exam: CDAI Score: Not documented Patient Global Assessment: Not documented; Provider Global Assessment: Not documented Swollen: Not documented; Tender: Not documented Joint Exam   Not documented   There is currently no information documented on the homunculus. Go to the Rheumatology activity and complete the homunculus joint exam.  Investigation: No additional findings.  Imaging: Xr Hand 2 View Left  Result Date: 07/24/2018 CMC, PIP and DIP narrowing was noted.  No MCP, intercarpal  radiocarpal joint space narrowing was noted.  No erosive changes were noted. Impression: These findings are consistent with osteoarthritis of the hand.  Xr Hand 2 View Right  Result Date: 07/24/2018 CMC, PIP and DIP narrowing was noted.  No MCP, intercarpal radiocarpal joint space narrowing was noted.  No erosive changes were noted. Impression: These findings are consistent with osteoarthritis of the hand.  Xr Knee 3 View Left  Result Date: 07/24/2018 Moderate medial compartment narrowing and moderate patellofemoral  narrowing was noted.  No chondrocalcinosis was noted. Impression: These findings are consistent with moderate osteoarthritis and moderate chondromalacia patella.  Xr Knee 3 View Right  Result Date: 07/24/2018 Moderate medial compartment narrowing was noted.  Mild patellofemoral narrowing was noted. Impression: These findings are consistent with moderate osteoarthritis and mild chondromalacia patella.  Xr Lumbar Spine 2-3 Views  Result Date: 07/24/2018 Facet joint arthropathy was noted.  Multilevel spondylosis with osteophytes was noted.  Calcification of abdominal aorta was noted.  T12-L1 narrowing was noted. Impression: These findings are consistent with multilevel spondylosis and facet joint arthropathy.   Recent Labs: Lab Results  Component Value Date   WBC 5.2 07/24/2018   HGB 11.9 07/24/2018   PLT 221 07/24/2018   NA 139 07/24/2018   K 3.7 07/24/2018   CL 103 07/24/2018   CO2 25 07/24/2018   GLUCOSE 129 (H) 07/24/2018   BUN 14 07/24/2018   CREATININE 1.00 (H) 07/24/2018   BILITOT 0.4 07/24/2018   AST 16 07/24/2018   ALT 11 07/24/2018   PROT 7.5 07/24/2018   PROT 7.3 07/24/2018   CALCIUM 9.4 07/24/2018   GFRAA 66 07/24/2018  IFE negative, immunoglobulins normal, hepatitis B-, hepatitis C negative, CK 56, TSH normal, UA negative ANA 1: 1280 speckled, SSA positive, SSB positive, (Smith, RNP, dsDNA, SCL 70-) C3-C4 normal, anti-CCP negative  Speciality Comments: No specialty comments available.  Procedures:  No procedures performed Allergies: Clindamycin/lincomycin; Codeine; Erythromycin; Meperidine hcl; Ondansetron hcl; Penicillins; Sulfonamide derivatives; and Zofran [ondansetron hcl]   Assessment / Plan:     Visit Diagnoses: No diagnosis found.   Orders: No orders of the defined types were placed in this encounter.  No orders of the defined types were placed in this encounter.   Face-to-face time spent with patient was *** minutes. Greater than 50% of time was  spent in counseling and coordination of care.  Follow-Up Instructions: No follow-ups on file.   Pollyann Savoy, MD  Note - This record has been created using Animal nutritionist.  Chart creation errors have been sought, but may not always  have been located. Such creation errors do not reflect on  the standard of medical care.

## 2018-08-21 ENCOUNTER — Ambulatory Visit: Payer: Medicare Other | Admitting: Rheumatology

## 2018-08-21 NOTE — Progress Notes (Signed)
Office Visit Note  Patient: Veronica Jordan             Date of Birth: January 23, 1946           MRN: [id number]             PCP: Marton Redwood, MD Referring: Marton Redwood, MD Visit Date: 09/03/2018 Occupation: _0 @  Subjective:  Dry mouth and dry eyes.   History of Present Illness: Veronica Jordan is a 73 y.o. female 3 of systemic lupus dermatosis, Sjogren's and osteoarthritis.  She states she has been experiencing dry eyes.  She had recent dental surgery.  She continues to have some stiffness in her joints but no joint swelling.  He needs to have some lower back pain from underlying disc disease.  Activities of Daily Living:  Patient reports morning stiffness for 30 minutes.   Patient Denies nocturnal pain.  Difficulty dressing/grooming: Denies Difficulty climbing stairs: Reports Difficulty getting out of chair: Reports Difficulty using hands for taps, buttons, cutlery, and/or writing: Denies  Review of Systems  Constitutional: Positive for fatigue. Negative for night sweats, weight gain and weight loss.  HENT: Negative for mouth sores, trouble swallowing, trouble swallowing, mouth dryness and nose dryness.   Eyes: Positive for dryness. Negative for pain, redness and visual disturbance.  Respiratory: Positive for shortness of breath. Negative for cough and difficulty breathing.   Cardiovascular: Negative for chest pain, palpitations, hypertension, irregular heartbeat and swelling in legs/feet.  Gastrointestinal: Positive for constipation and diarrhea. Negative for blood in stool.       IBS  Endocrine: Negative for increased urination.  Genitourinary: Negative for vaginal dryness.  Musculoskeletal: Positive for arthralgias, joint pain and morning stiffness. Negative for joint swelling, myalgias, muscle weakness, muscle tenderness and myalgias.  Skin: Negative for color change, rash, hair loss, skin tightness, ulcers and sensitivity to sunlight.  Allergic/Immunologic: Negative  for susceptible to infections.  Neurological: Negative for dizziness, memory loss, night sweats and weakness.  Hematological: Negative for swollen glands.  Psychiatric/Behavioral: Positive for depressed mood and sleep disturbance. The patient is nervous/anxious.     PMFS History:  Patient Active Problem List   Diagnosis Date Noted  . Sjogren's syndrome with keratoconjunctivitis sicca ([place]) 08/07/2018  . History of nephritis 08/07/2018  . History of optic neuritis 08/07/2018  . Fibromyalgia 08/07/2018  . Primary osteoarthritis of both hands 08/07/2018  . Primary osteoarthritis of both knees 08/07/2018  . DDD (degenerative disc disease), lumbar 08/07/2018  . Eosinophilic esophagitis 68/34/1962  . History of IBS 08/07/2018  . History of gastroesophageal reflux (GERD) 08/07/2018  . History of diverticulosis 08/07/2018  . History of type 2 diabetes mellitus 08/07/2018  . History of kidney stones 08/07/2018  . History of peripheral neuropathy 08/07/2018  . Dyslipidemia 08/07/2018  . Anxiety and depression 08/07/2018  . Essential tremor 08/07/2018  . Postmenopausal hormone replacement therapy 03/05/2015  . Osteoporosis 03/05/2015  . Atrophic vaginitis 03/05/2015  . HYPERTHYROIDISM 12/13/2007  . Hypercholesterolemia 12/13/2007  . Essential hypertension 12/13/2007  . ALLERGIC RHINITIS 12/13/2007  . LUPUS ERYTHEMATOSUS 12/13/2007  . DYSPNEA 12/13/2007    Past Medical History:  Diagnosis Date  . Allergic rhinitis   . Anemia   . Anxiety and depression   . Depression   . Diabetes mellitus   . Diverticulitis   . Fibromyalgia   . GERD (gastroesophageal reflux disease)   . History of gallstones   . History of kidney stones   . Hyperlipemia   . Hypertension   .  Hypothyroidism   . IBS (irritable bowel syndrome)   . Osteoarthritis   . Osteoporosis   . Renal calculi   . Sjogren's syndrome ([place])   . Systemic lupus ([place]) 2001    Family History  Problem Relation Age of Onset  .  Asthma Mother   . COPD Mother   . Heart disease Father   . Asthma Sister   . Parkinsonism Other        MGM  . Hypertension Brother   . Heart attack Brother   . Arthritis Maternal Grandmother   . Heart disease Maternal Grandfather   . Lupus Paternal Grandmother   . Heart attack Paternal Grandfather   . Hypertension Sister   . Chronic fatigue Daughter   . Colon cancer Neg Hx   . Esophageal cancer Neg Hx   . Stomach cancer Neg Hx    Past Surgical History:  Procedure Laterality Date  . APPENDECTOMY  1970  . BREAST EXCISIONAL BIOPSY Left   . CATARACT EXTRACTION     bilateral  . [place]   x2  . CHOLECYSTECTOMY  1988  . TONSILLECTOMY AND ADENOIDECTOMY    . TOTAL ABDOMINAL HYSTERECTOMY  2000   BSO due to fibroids   Social History   Social History Narrative   Youngest daughter died in [place].    There is no immunization history on file for this patient.   Objective: Vital Signs: BP 124/75 (BP Location: Left Arm, Patient Position: Sitting, Cuff Size: Normal)   Pulse 65   Resp 13   Ht 4' 9.75" (1.467 m)   Wt 119 lb 3.2 oz (54.1 kg)   LMP 07/03/1998 (Approximate)   BMI 25.13 kg/m    Physical Exam Vitals signs and nursing note reviewed.  Constitutional:      Appearance: She is well-developed.  HENT:     Head: Normocephalic and atraumatic.  Eyes:     Conjunctiva/sclera: Conjunctivae normal.  Neck:     Musculoskeletal: Normal range of motion.  Cardiovascular:     Rate and Rhythm: Normal rate and regular rhythm.     Heart sounds: Normal heart sounds.  Pulmonary:     Effort: Pulmonary effort is normal.     Breath sounds: Normal breath sounds.  Abdominal:     General: Bowel sounds are normal.     Palpations: Abdomen is soft.  Lymphadenopathy:     Cervical: No cervical adenopathy.  Skin:    General: Skin is warm and dry.     Capillary Refill: Capillary refill takes less than 2 seconds.  Neurological:     Mental Status: She is alert and oriented  to person, place, and time.  Psychiatric:        Behavior: Behavior normal.      Musculoskeletal Exam: Spine thoracic lumbar spine good range of motion.  Shoulder joints elbow joints wrist joints with good range of motion.  She has DIP and PIP thickening with no synovitis.  Hip joints had limited range of motion without discomfort.  Knee joints ankles MTPs PIPs with good range of motion with no synovitis.  CDAI Exam: CDAI Score: Not documented Patient Global Assessment: Not documented; Provider Global Assessment: Not documented Swollen: Not documented; Tender: Not documented Joint Exam   Not documented   There is currently no information documented on the homunculus. Go to the Rheumatology activity and complete the homunculus joint exam.  Investigation: No additional findings.  Imaging: No results found.  Recent Labs: Lab Results  Component Value Date   WBC 5.2 07/24/2018   HGB 11.9 07/24/2018   PLT 221 07/24/2018   NA 139 07/24/2018   K 3.7 07/24/2018   CL 103 07/24/2018   CO2 25 07/24/2018   GLUCOSE 129 (H) 07/24/2018   BUN 14 07/24/2018   CREATININE 1.00 (H) 07/24/2018   BILITOT 0.4 07/24/2018   AST 16 07/24/2018   ALT 11 07/24/2018   PROT 7.5 07/24/2018   PROT 7.3 07/24/2018   CALCIUM 9.4 07/24/2018   GFRAA 66 07/24/2018  IFE negative, immunoglobulins normal, hepatitis B-, hepatitis C negative, CK 56, TSH normal, UA negative ANA 1: 1280 speckled, anti-SSA positive, anti-SSB positive, ( Smith, RNP, dsDNA negative, SCL 70-) ESR 36, C3-C4 normal, anti-CCP negative,  Speciality Comments: No specialty comments available.  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  Patient has been in remission for the last few years.  She recently started having increased sicca symptoms and increased joint stiffness.  Her most recent labs she had positive ANA and Ro and La antibodies.  We had detailed discussion regarding going on Plaquenil again.  She was in agreement.  We will start her on Plaquenil 200 mg 1 tablet p.o. daily.  She will get labs in 1 month and then every 3 months.  She will also get baseline eye examination and then yearly eye examination.  History of nephritis-she has been in remission and sees nephrologist once a year.  History of optic neuritis-she has no recurrence of optic neuritis.  Sjogren's syndrome with keratoconjunctivitis sicca (HCC)-over-the-counter products were discussed.  She continues to have severe sicca symptoms.  High risk medication use-she will be starting Plaquenil.  Primary osteoarthritis of both hands-joint protection muscle strengthening was discussed.  Primary osteoarthritis of both knees-she has discomfort with climbing stairs.  DDD (degenerative disc disease), lumbar-she has chronic lower back pain.  Other medical problems are listed as follows:  Anxiety and depression  Dyslipidemia  Eosinophilic esophagitis  Essential hypertension  Essential tremor  Fibromyalgia-she continues to have some generalized pain.  History of diverticulosis  History of gastroesophageal reflux (GERD)  History of IBS  History of kidney stones  History of type 2 diabetes mellitus  Age-related osteoporosis without current pathological fracture   Orders: Orders Placed This Encounter  Procedures  . CMP14+EGFR  . CBC with Differential/Platelet   Meds ordered this encounter  Medications  . hydroxychloroquine (PLAQUENIL) 200 MG tablet    Sig: Take 1 tablet (200 mg total) by mouth daily.    Dispense:  90 tablet    Refill:  0      Follow-Up  Instructions: Return in about 5 months (around 02/03/2019) for Systemic lupus.   Bo Merino, MD  Note - This record has been created using Editor, commissioning.  Chart creation errors have been sought, but may not always  have been located. Such creation errors do not reflect on  the standard of medical care.

## 2018-09-03 ENCOUNTER — Ambulatory Visit (INDEPENDENT_AMBULATORY_CARE_PROVIDER_SITE_OTHER): Payer: Medicare Other | Admitting: Rheumatology

## 2018-09-03 ENCOUNTER — Encounter: Payer: Self-pay | Admitting: Rheumatology

## 2018-09-03 VITALS — BP 124/75 | HR 65 | Resp 13 | Ht <= 58 in | Wt 119.2 lb

## 2018-09-03 DIAGNOSIS — Z8669 Personal history of other diseases of the nervous system and sense organs: Secondary | ICD-10-CM

## 2018-09-03 DIAGNOSIS — Z87448 Personal history of other diseases of urinary system: Secondary | ICD-10-CM | POA: Diagnosis not present

## 2018-09-03 DIAGNOSIS — G250 Essential tremor: Secondary | ICD-10-CM

## 2018-09-03 DIAGNOSIS — M3219 Other organ or system involvement in systemic lupus erythematosus: Secondary | ICD-10-CM

## 2018-09-03 DIAGNOSIS — M5136 Other intervertebral disc degeneration, lumbar region: Secondary | ICD-10-CM

## 2018-09-03 DIAGNOSIS — I10 Essential (primary) hypertension: Secondary | ICD-10-CM

## 2018-09-03 DIAGNOSIS — M19042 Primary osteoarthritis, left hand: Secondary | ICD-10-CM

## 2018-09-03 DIAGNOSIS — M3501 Sicca syndrome with keratoconjunctivitis: Secondary | ICD-10-CM

## 2018-09-03 DIAGNOSIS — M19041 Primary osteoarthritis, right hand: Secondary | ICD-10-CM

## 2018-09-03 DIAGNOSIS — Z8639 Personal history of other endocrine, nutritional and metabolic disease: Secondary | ICD-10-CM

## 2018-09-03 DIAGNOSIS — Z79899 Other long term (current) drug therapy: Secondary | ICD-10-CM | POA: Diagnosis not present

## 2018-09-03 DIAGNOSIS — E785 Hyperlipidemia, unspecified: Secondary | ICD-10-CM | POA: Diagnosis not present

## 2018-09-03 DIAGNOSIS — M170 Bilateral primary osteoarthritis of knee: Secondary | ICD-10-CM

## 2018-09-03 DIAGNOSIS — F329 Major depressive disorder, single episode, unspecified: Secondary | ICD-10-CM

## 2018-09-03 DIAGNOSIS — M797 Fibromyalgia: Secondary | ICD-10-CM

## 2018-09-03 DIAGNOSIS — K200 Eosinophilic esophagitis: Secondary | ICD-10-CM

## 2018-09-03 DIAGNOSIS — Z8719 Personal history of other diseases of the digestive system: Secondary | ICD-10-CM

## 2018-09-03 DIAGNOSIS — M810 Age-related osteoporosis without current pathological fracture: Secondary | ICD-10-CM

## 2018-09-03 DIAGNOSIS — Z87442 Personal history of urinary calculi: Secondary | ICD-10-CM

## 2018-09-03 DIAGNOSIS — F419 Anxiety disorder, unspecified: Secondary | ICD-10-CM

## 2018-09-03 MED ORDER — HYDROXYCHLOROQUINE SULFATE 200 MG PO TABS: 200.0000 mg | ORAL_TABLET | Freq: Every day | ORAL | 0 refills | Status: DC

## 2018-09-03 NOTE — Patient Instructions (Signed)
Standing Labs We placed an order today for your standing lab work.    Please come back and get your standing labs in 1 month, then 3 months, then every 5 months.  We have open lab Monday through Friday from 8:30-11:30 AM and 1:30-4:00 PM  at the office of Dr. Pollyann Savoy.   You may experience shorter wait times on Monday and Friday afternoons. The office is located at [address], [place], [place] [address] No appointment is necessary.   Labs are drawn by [facility].  You may receive a bill from [facility] for your lab work.  If you wish to have your labs drawn at another location, please call the office 24 hours in advance to send orders.  If you have any questions regarding directions or hours of operation,  please call [phone].   Just as a reminder please drink plenty of water prior to coming for your lab work. Thanks!  Vaccines You are taking a medication(s) that can suppress your immune system.  The following immunizations are recommended: . Flu annually . Pneumonia (Pneumovax 23 and Prevnar 13 spaced at least 1 year apart) . Shingrix  Please check with your PCP to make sure you are up to date.  Hydroxychloroquine tablets What is this medicine? HYDROXYCHLOROQUINE (hye drox ee KLOR oh kwin) is used to treat rheumatoid arthritis and systemic lupus erythematosus. It is also used to treat malaria. This medicine may be used for other purposes; ask your health care provider or pharmacist if you have questions. COMMON BRAND NAME(S): Plaquenil, Quineprox What should I tell my health care provider before I take this medicine? They need to know if you have any of these conditions: -diabetes -eye disease, vision problems -G6PD deficiency -history of blood diseases -history of irregular heartbeat -if you often drink alcohol -kidney disease -liver disease -porphyria -psoriasis -seizures -an unusual or allergic reaction to chloroquine, hydroxychloroquine, other  medicines, foods, dyes, or preservatives -pregnant or trying to get pregnant -breast-feeding How should I use this medicine? Take this medicine by mouth with a glass of water. Follow the directions on the prescription label. Avoid taking antacids within 4 hours of taking this medicine. It is best to separate these medicines by at least 4 hours. Do not cut, crush or chew this medicine. You can take it with or without food. If it upsets your stomach, take it with food. Take your medicine at regular intervals. Do not take your medicine more often than directed. Take all of your medicine as directed even if you think you are better. Do not skip doses or stop your medicine early. Talk to your pediatrician regarding the use of this medicine in children. While this drug may be prescribed for selected conditions, precautions do apply. Overdosage: If you think you have taken too much of this medicine contact a poison control center or emergency room at once. NOTE: This medicine is only for you. Do not share this medicine with others. What if I miss a dose? If you miss a dose, take it as soon as you can. If it is almost time for your next dose, take only that dose. Do not take double or extra doses. What may interact with this medicine? Do not take this medicine with any of the following medications: -cisapride -dofetilide -dronedarone -live virus vaccines -penicillamine -pimozide -thioridazine -ziprasidone This medicine may also interact with the following medications: -ampicillin -antacids -cimetidine -cyclosporine -digoxin -medicines for diabetes, like insulin, glipizide, glyburide -medicines for seizures like carbamazepine,  phenobarbital, phenytoin -mefloquine -methotrexate -other medicines that prolong the QT interval (cause an abnormal heart rhythm) -praziquantel This list may not describe all possible interactions. Give your health care provider a list of all the medicines, herbs,  non-prescription drugs, or dietary supplements you use. Also tell them if you smoke, drink alcohol, or use illegal drugs. Some items may interact with your medicine. What should I watch for while using this medicine? Tell your doctor or healthcare professional if your symptoms do not start to get better or if they get worse. Avoid taking antacids within 4 hours of taking this medicine. It is best to separate these medicines by at least 4 hours. Tell your doctor or health care professional right away if you have any change in your eyesight. Your vision and blood may be tested before and during use of this medicine. This medicine can make you more sensitive to the sun. Keep out of the sun. If you cannot avoid being in the sun, wear protective clothing and use sunscreen. Do not use sun lamps or tanning beds/booths. What side effects may I notice from receiving this medicine? Side effects that you should report to your doctor or health care professional as soon as possible: -allergic reactions like skin rash, itching or hives, swelling of the face, lips, or tongue -changes in vision -decreased hearing or ringing of the ears -redness, blistering, peeling or loosening of the skin, including inside the mouth -seizures -sensitivity to light -signs and symptoms of a dangerous change in heartbeat or heart rhythm like chest pain; dizziness; fast or irregular heartbeat; palpitations; feeling faint or lightheaded, falls; breathing problems -signs and symptoms of liver injury like dark yellow or Ertl urine; general ill feeling or flu-like symptoms; light-colored stools; loss of appetite; nausea; right upper belly pain; unusually weak or tired; yellowing of the eyes or skin -signs and symptoms of low blood sugar such as feeling anxious; confusion; dizziness; increased hunger; unusually weak or tired; sweating; shakiness; cold; irritable; headache; blurred vision; fast heartbeat; loss of  consciousness -uncontrollable head, mouth, neck, arm, or leg movements Side effects that usually do not require medical attention (report to your doctor or health care professional if they continue or are bothersome): -anxious -diarrhea -dizziness -hair loss -headache -irritable -loss of appetite -nausea, vomiting -stomach pain This list may not describe all possible side effects. Call your doctor for medical advice about side effects. You may report side effects to FDA at 1-800-FDA-1088. Where should I keep my medicine? Keep out of the reach of children. In children, this medicine can cause overdose with small doses. Store at room temperature between 15 and 30 degrees C (59 and 86 degrees F). Protect from moisture and light. Throw away any unused medicine after the expiration date. NOTE: This sheet is a summary. It may not cover all possible information. If you have questions about this medicine, talk to your doctor, pharmacist, or health care provider.  2019 Elsevier/Gold Standard (2016-02-02 14:16:15)

## 2018-09-03 NOTE — Progress Notes (Signed)
Pharmacy Note  Subjective: Patient presents today to the [facility] to see Dr. Corliss Skains.  Patient seen by the pharmacist for counseling on hydroxychloroquine systemic lupus erythematosus. She has been on Plaquenil in the past.  Objective: CMP     Component Value Date/Time   NA 139 07/24/2018 1104   K 3.7 07/24/2018 1104   CL 103 07/24/2018 1104   CO2 25 07/24/2018 1104   GLUCOSE 129 (H) 07/24/2018 1104   BUN 14 07/24/2018 1104   CREATININE 1.00 (H) 07/24/2018 1104   CALCIUM 9.4 07/24/2018 1104   PROT 7.5 07/24/2018 1104   PROT 7.3 07/24/2018 1104   AST 16 07/24/2018 1104   ALT 11 07/24/2018 1104   BILITOT 0.4 07/24/2018 1104   GFRNONAA 57 (L) 07/24/2018 1104   GFRAA 66 07/24/2018 1104    CBC    Component Value Date/Time   WBC 5.2 07/24/2018 1104   RBC 4.06 07/24/2018 1104   HGB 11.9 07/24/2018 1104   HCT 35.8 07/24/2018 1104   PLT 221 07/24/2018 1104   MCV 88.2 07/24/2018 1104   MCH 29.3 07/24/2018 1104   MCHC 33.2 07/24/2018 1104   RDW 12.5 07/24/2018 1104   LYMPHSABS 1,399 07/24/2018 1104   EOSABS 203 07/24/2018 1104   BASOSABS 73 07/24/2018 1104    Assessment/Plan: Patient was counseled on the purpose, proper use, and adverse effects of hydroxychloroquine including nausea/diarrhea, skin rash, headaches, and sun sensitivity.  Discussed importance of annual eye exams while on hydroxychloroquine to monitor to ocular toxicity and discussed importance of frequent laboratory monitoring.  Provided patient with eye exam form for baseline ophthalmologic exam and standing lab instructions.  Provided patient with educational materials on hydroxychloroquine and answered all questions.  Patient consented to hydroxychloroquine.  Will upload consent in the media tab.  Recommend annual influenza, Pneumovax 23, Prevnar 13, and Shingrix as indicated.  Patient states that she received both pneumonia vaccines.  Dose will be Plaquenil 200 mg once daily. [facility] per patient request.  All questions encouraged and answered.  Instructed patient to call with any further questions or concerns.  Verlin Fester, PharmD, [facility] Rheumatology Clinical Pharmacist  09/03/2018 2:04 PM

## 2018-09-12 DIAGNOSIS — R05 Cough: Secondary | ICD-10-CM | POA: Diagnosis not present

## 2018-09-12 DIAGNOSIS — J019 Acute sinusitis, unspecified: Secondary | ICD-10-CM | POA: Diagnosis not present

## 2018-09-13 ENCOUNTER — Telehealth: Payer: Self-pay | Admitting: Pharmacist

## 2018-09-13 NOTE — Telephone Encounter (Signed)
[facility] Follow UP  Called patient today to follow up regarding patient's new rheumatology medication: Plaquenil.   Denies medication side effects.  Denies trouble obtaining medication from the pharmacy.  She received her shipment earlier this week.  Next office visit is 02/05/2019.  Due for labs 10/04/2018.  Patient informed us that she found out she had pneumonia this morning.  Informed her she may continue Plaquenil. Patient verbalized understanding.  All questions encouraged and answered.  Instructed patient to call with any further questions or concerns.  Verlin Fester, PharmD, [facility] Rheumatology Clinical Pharmacist  09/13/2018 3:14 PM

## 2018-11-22 DIAGNOSIS — H468 Other optic neuritis: Secondary | ICD-10-CM | POA: Diagnosis not present

## 2018-11-22 DIAGNOSIS — L930 Discoid lupus erythematosus: Secondary | ICD-10-CM | POA: Diagnosis not present

## 2018-11-22 DIAGNOSIS — H538 Other visual disturbances: Secondary | ICD-10-CM | POA: Diagnosis not present

## 2018-11-26 LAB — HM DIABETES EYE EXAM

## 2018-11-28 DIAGNOSIS — H469 Unspecified optic neuritis: Secondary | ICD-10-CM | POA: Diagnosis not present

## 2018-12-07 ENCOUNTER — Other Ambulatory Visit: Payer: Self-pay | Admitting: Rheumatology

## 2018-12-07 DIAGNOSIS — M3219 Other organ or system involvement in systemic lupus erythematosus: Secondary | ICD-10-CM

## 2018-12-07 DIAGNOSIS — Z79899 Other long term (current) drug therapy: Secondary | ICD-10-CM

## 2018-12-09 NOTE — Telephone Encounter (Signed)
Last Visit: 09/03/18 Next visit: 02/05/19 Labs: 07/24/18 Glucose 129 Creat 1.29 GFR 57  PLQ eye exam no baseline on file  Patient advised she is due to update labs and we needs PLQ eye exam. Patient states she has had her PLQ eye exam done and will have results faxed.   Okay to refill 30 day supply per Dr. Estanislado Pandy

## 2018-12-25 DIAGNOSIS — Z79899 Other long term (current) drug therapy: Secondary | ICD-10-CM | POA: Diagnosis not present

## 2018-12-26 LAB — CMP14+EGFR
ALT: 13 IU/L (ref 0–32)
AST: 20 IU/L (ref 0–40)
Albumin/Globulin Ratio: 1.7 (ref 1.2–2.2)
Albumin: 4.3 g/dL (ref 3.7–4.7)
Alkaline Phosphatase: 87 IU/L (ref 39–117)
BUN/Creatinine Ratio: 15 (ref 12–28)
BUN: 15 mg/dL (ref 8–27)
Bilirubin Total: 0.3 mg/dL (ref 0.0–1.2)
CO2: 22 mmol/L (ref 20–29)
Calcium: 9 mg/dL (ref 8.7–10.3)
Chloride: 106 mmol/L (ref 96–106)
Creatinine, Ser: 1 mg/dL (ref 0.57–1.00)
GFR calc Af Amer: 65 mL/min/{1.73_m2} (ref >59)
GFR calc non Af Amer: 57 mL/min/{1.73_m2} — ABNORMAL LOW (ref >59)
Globulin, Total: 2.5 g/dL (ref 1.5–4.5)
Glucose: 137 mg/dL — ABNORMAL HIGH (ref 65–99)
Potassium: 4.2 mmol/L (ref 3.5–5.2)
Sodium: 141 mmol/L (ref 134–144)
Total Protein: 6.8 g/dL (ref 6.0–8.5)

## 2018-12-26 LAB — CBC WITH DIFFERENTIAL/PLATELET
Basophils Absolute: 0.1 10*3/uL (ref 0.0–0.2)
Basos: 2 %
EOS (ABSOLUTE): 0.2 10*3/uL (ref 0.0–0.4)
Eos: 4 %
Hematocrit: 35.3 % (ref 34.0–46.6)
Hemoglobin: 11.8 g/dL (ref 11.1–15.9)
Lymphocytes Absolute: 1.4 10*3/uL (ref 0.7–3.1)
Lymphs: 31 %
MCH: 28.9 pg (ref 26.6–33.0)
MCHC: 33.4 g/dL (ref 31.5–35.7)
MCV: 87 fL (ref 79–97)
Monocytes Absolute: 0.3 10*3/uL (ref 0.1–0.9)
Monocytes: 7 %
Neutrophils Absolute: 2.6 10*3/uL (ref 1.4–7.0)
Neutrophils: 56 %
Platelets: 206 10*3/uL (ref 150–450)
RBC: 4.08 x10E6/uL (ref 3.77–5.28)
RDW: 13 % (ref 11.7–15.4)
WBC: 4.6 10*3/uL (ref 3.4–10.8)

## 2019-01-07 ENCOUNTER — Other Ambulatory Visit: Payer: Self-pay | Admitting: Rheumatology

## 2019-01-07 DIAGNOSIS — M3219 Other organ or system involvement in systemic lupus erythematosus: Secondary | ICD-10-CM

## 2019-01-08 NOTE — Telephone Encounter (Addendum)
Last Visit: 09/03/18 Next visit: 02/05/19 Labs: 12/25/18 CBC WNL. Glucose is elevated-137. GFR is borderline low but stable-57. PLQ eye exam no baseline on file  Patient advised she is due for PLQ eye exam. Patient states she has had the PLQ eye exam. Patient had it done at [facility]. Contacted [facility] and they will fax results.   Okay to refill PLQ?

## 2019-01-09 NOTE — Telephone Encounter (Signed)
Ok to refill PLQ.

## 2019-01-22 NOTE — Progress Notes (Signed)
Office Visit Note  Patient: Veronica Jordan             Date of Birth: 02-05-1946           MRN: [id number]             PCP: Marton Redwood, MD Referring: Marton Redwood, MD Visit Date: 02/05/2019 Occupation: @GUAROCC @  Subjective:  Lower back pain.   History of Present Illness: Veronica Jordan is a 73 y.o. female with history of systemic lupus dermatosis, Sjogren's, osteoarthritis and degenerative disc disease.  She states she has not had a flare of lupus.  She continues to have dry eyes.  She does not have any dry mouth currently.  She has been having some thoracic and lower back pain.  She denies any flare of her optic neuritis or nephritis.  She has been seen by nephrologist on yearly basis.  Patient states she has been experiencing some lower back pain especially when she gets up in the morning.  She denies any radiculopathy.  She also believes that the fibromyalgia syndrome is flaring with some generalized pain.  Activities of Daily Living:  Patient reports morning stiffness for 0 minutes.   Patient Denies nocturnal pain.  Difficulty dressing/grooming: Denies Difficulty climbing stairs: Reports Difficulty getting out of chair: Reports Difficulty using hands for taps, buttons, cutlery, and/or writing: Denies  Review of Systems  Constitutional: Positive for fatigue. Negative for night sweats, weight gain and weight loss.  HENT: Negative for mouth sores, trouble swallowing, trouble swallowing, mouth dryness and nose dryness.   Eyes: Positive for dryness. Negative for pain, redness and visual disturbance.  Respiratory: Negative for cough, shortness of breath and difficulty breathing.   Cardiovascular: Negative for chest pain, palpitations, hypertension, irregular heartbeat and swelling in legs/feet.  Gastrointestinal: Positive for diarrhea. Negative for blood in stool and constipation.       History of IBS  Endocrine: Negative for increased urination.  Genitourinary: Negative for  vaginal dryness.  Musculoskeletal: Positive for arthralgias and joint pain. Negative for joint swelling, myalgias, muscle weakness, morning stiffness, muscle tenderness and myalgias.  Skin: Negative for color change, rash, hair loss, skin tightness, ulcers and sensitivity to sunlight.  Allergic/Immunologic: Negative for susceptible to infections.  Neurological: Negative for dizziness, memory loss, night sweats and weakness.  Hematological: Negative for swollen glands.  Psychiatric/Behavioral: Negative for depressed mood and sleep disturbance. The patient is nervous/anxious.     PMFS History:  Patient Active Problem List   Diagnosis Date Noted  . Sjogren's syndrome with keratoconjunctivitis sicca ([place]) 08/07/2018  . History of nephritis 08/07/2018  . History of optic neuritis 08/07/2018  . Fibromyalgia 08/07/2018  . Primary osteoarthritis of both hands 08/07/2018  . Primary osteoarthritis of both knees 08/07/2018  . DDD (degenerative disc disease), lumbar 08/07/2018  . Eosinophilic esophagitis 62/13/0865  . History of IBS 08/07/2018  . History of gastroesophageal reflux (GERD) 08/07/2018  . History of diverticulosis 08/07/2018  . History of type 2 diabetes mellitus 08/07/2018  . History of kidney stones 08/07/2018  . History of peripheral neuropathy 08/07/2018  . Dyslipidemia 08/07/2018  . Anxiety and depression 08/07/2018  . Essential tremor 08/07/2018  . Postmenopausal hormone replacement therapy 03/05/2015  . Osteoporosis 03/05/2015  . Atrophic vaginitis 03/05/2015  . HYPERTHYROIDISM 12/13/2007  . Hypercholesterolemia 12/13/2007  . Essential hypertension 12/13/2007  . ALLERGIC RHINITIS 12/13/2007  . LUPUS ERYTHEMATOSUS 12/13/2007  . DYSPNEA 12/13/2007    Past Medical History:  Diagnosis Date  . Allergic rhinitis   .  Anemia   . Anxiety and depression   . Depression   . Diabetes mellitus   . Diverticulitis   . Fibromyalgia   . GERD (gastroesophageal reflux disease)    . History of gallstones   . History of kidney stones   . Hyperlipemia   . Hypertension   . Hypothyroidism   . IBS (irritable bowel syndrome)   . Osteoarthritis   . Osteoporosis   . Renal calculi   . Sjogren's syndrome ([place])   . Systemic lupus ([place]) 2001    Family History  Problem Relation Age of Onset  . Asthma Mother   . COPD Mother   . Heart disease Father   . Asthma Sister   . Parkinsonism Other        MGM  . Hypertension Brother   . Heart attack Brother   . Arthritis Maternal Grandmother   . Heart disease Maternal Grandfather   . Lupus Paternal Grandmother   . Heart attack Paternal Grandfather   . Hypertension Sister   . Chronic fatigue Daughter   . Colon cancer Neg Hx   . Esophageal cancer Neg Hx   . Stomach cancer Neg Hx    Past Surgical History:  Procedure Laterality Date  . APPENDECTOMY  1970  . BREAST EXCISIONAL BIOPSY Left   . CATARACT EXTRACTION     bilateral  . [place]   x2  . CHOLECYSTECTOMY  1988  . TONSILLECTOMY AND ADENOIDECTOMY    . TOTAL ABDOMINAL HYSTERECTOMY  2000   BSO due to fibroids   Social History   Social History Narrative   Youngest daughter died in [place].    There is no immunization history on file for this patient.   Objective: Vital Signs: BP 125/71 (BP Location: Left Arm, Patient Position: Sitting, Cuff Size: Small)   Pulse (!) 57   Resp 12   Ht 4' 10"  (1.473 m)   Wt 124 lb 6.4 oz (56.4 kg)   LMP 07/03/1998 (Approximate)   BMI 26.00 kg/m    Physical Exam Vitals signs and nursing note reviewed.  Constitutional:      Appearance: She is well-developed.  HENT:     Head: Normocephalic and atraumatic.  Eyes:     Conjunctiva/sclera: Conjunctivae normal.  Neck:     Musculoskeletal: Normal range of motion.  Cardiovascular:     Rate and Rhythm: Normal rate and regular rhythm.     Heart sounds: Normal heart sounds.  Pulmonary:     Effort: Pulmonary effort is normal.     Breath sounds: Normal breath  sounds.  Abdominal:     General: Bowel sounds are normal.     Palpations: Abdomen is soft.  Lymphadenopathy:     Cervical: No cervical adenopathy.  Skin:    General: Skin is warm and dry.     Capillary Refill: Capillary refill takes less than 2 seconds.  Neurological:     Mental Status: She is alert and oriented to person, place, and time.  Psychiatric:        Behavior: Behavior normal.      Musculoskeletal Exam: C-spine was in good range of motion.  She has thoracic and lumbar discomfort with range of motion.  She is good range of motion of her shoulders, elbows, wrist joints, MCPs PIPs and DIPs.  She has DIP thickening bilaterally consistent with osteoarthritis with no synovitis.  She had discomfort range of motion of her right hip joint.  Knee joints ankles MTPs and  PIPs with good range of motion with no synovitis.  CDAI Exam: CDAI Score: - Patient Global: -; Provider Global: - Swollen: -; Tender: - Joint Exam   No joint exam has been documented for this visit   There is currently no information documented on the homunculus. Go to the Rheumatology activity and complete the homunculus joint exam.  Investigation: No additional findings.  Imaging: No results found.  Recent Labs: Lab Results  Component Value Date   WBC 4.6 12/25/2018   HGB 11.8 12/25/2018   PLT 206 12/25/2018   NA 141 12/25/2018   K 4.2 12/25/2018   CL 106 12/25/2018   CO2 22 12/25/2018   GLUCOSE 137 (H) 12/25/2018   BUN 15 12/25/2018   CREATININE 1.00 12/25/2018   BILITOT 0.3 12/25/2018   ALKPHOS 87 12/25/2018   AST 20 12/25/2018   ALT 13 12/25/2018   PROT 6.8 12/25/2018   ALBUMIN 4.3 12/25/2018   CALCIUM 9.0 12/25/2018   GFRAA 65 12/25/2018    Speciality Comments: No specialty comments available.  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 -Patient is clinically doing well with no synovitis on examination.  Although she continues to have some sicca symptoms.  Over-the-counter products were discussed.  Plan: Urinalysis, Routine w reflex microscopic, ANA, Anti-DNA antibody, double-stranded, C3 and C4, Sedimentation rate, with her next labs in September.  History of optic neuritis -she denies any recurrence of optic neuritis.  History of nephritis - she has been in remission and sees nephrologist once a year.   Sjogren's syndrome with keratoconjunctivitis sicca ([place]) -she denies any dry mouth symptoms.  She has dry eyes.  I have advised her to use some over-the-counter teardrops.  High risk medication use - PLQ 200 mg p.o. daily.  Patient states that she has been getting eye exam on yearly basis.  Her labs were normal in July.  We will repeat labs again in September..- Plan: CBC with Differential/Platelet, COMPLETE METABOLIC PANEL WITH GFR,   Primary osteoarthritis of both hands -she has DIP thickening and subluxation of some of the DIP joints.  Joint protection was discussed.  Primary osteoarthritis of both knees -she is currently not having much discomfort in her knee joints.  Although she had discomfort range of motion of her right hip joint.  I offered x-ray of her right hip joint but she declined.  Have given her a handout on hip joint exercises.  DDD (degenerative disc disease), lumbar -he complains of increased lower back pain.  There is no radiculopathy.  Have given her a handout on back exercises.  Age-related osteoporosis without current pathological fracture - Plan: I do not have a recent bone density available on for her.  I have advised her to discuss that further with Dr. Brigitte Pulse.  Fibromyalgia  -she continues to have some generalized pain and discomfort.  She is also going through some difficult time and feels anxious.  History of type 2 diabetes mellitus   Essential tremor   History of gastroesophageal reflux (GERD) -she is on Protonix.  Dyslipidemia -dietary modification was discussed.  Association of autoimmune disease with heart disease was also discussed.  Need for regular exercise was emphasized.  Anxiety and depression -  she is on medications.  Essential hypertension -blood pressure is well controlled.  History of diverticulosis   History of kidney stones   History of IBS -she continues to have diarrhea.  Eosinophilic esophagitis   Orders: Orders Placed This Encounter  Procedures  . CBC with Differential/Platelet  . COMPLETE METABOLIC PANEL WITH GFR  . Urinalysis, Routine w reflex microscopic  . ANA  . Anti-DNA antibody, double-stranded  . C3 and C4  . Sedimentation rate   No orders of the defined types were placed in this encounter.   Face-to-face time spent with patient was 30 minutes. Greater than 50% of time was spent in counseling and coordination of care.  Follow-Up Instructions: Return in about 5 months (around 07/08/2019) for Systemic lupus, Sjogren's.   Bo Merino, MD  Note - This record has been created using Editor, commissioning.  Chart creation errors have been sought, but may not always  have been located. Such creation errors do not reflect on  the standard of medical care.

## 2019-02-05 ENCOUNTER — Encounter: Payer: Self-pay | Admitting: Physician Assistant

## 2019-02-05 ENCOUNTER — Ambulatory Visit (INDEPENDENT_AMBULATORY_CARE_PROVIDER_SITE_OTHER): Payer: Medicare Other | Admitting: Rheumatology

## 2019-02-05 ENCOUNTER — Other Ambulatory Visit: Payer: Self-pay

## 2019-02-05 VITALS — BP 125/71 | HR 57 | Resp 12 | Ht <= 58 in | Wt 124.4 lb

## 2019-02-05 DIAGNOSIS — Z8719 Personal history of other diseases of the digestive system: Secondary | ICD-10-CM

## 2019-02-05 DIAGNOSIS — G250 Essential tremor: Secondary | ICD-10-CM | POA: Diagnosis not present

## 2019-02-05 DIAGNOSIS — M170 Bilateral primary osteoarthritis of knee: Secondary | ICD-10-CM

## 2019-02-05 DIAGNOSIS — Z87448 Personal history of other diseases of urinary system: Secondary | ICD-10-CM

## 2019-02-05 DIAGNOSIS — Z8639 Personal history of other endocrine, nutritional and metabolic disease: Secondary | ICD-10-CM | POA: Diagnosis not present

## 2019-02-05 DIAGNOSIS — F32A Depression, unspecified: Secondary | ICD-10-CM

## 2019-02-05 DIAGNOSIS — F419 Anxiety disorder, unspecified: Secondary | ICD-10-CM

## 2019-02-05 DIAGNOSIS — F329 Major depressive disorder, single episode, unspecified: Secondary | ICD-10-CM

## 2019-02-05 DIAGNOSIS — M3219 Other organ or system involvement in systemic lupus erythematosus: Secondary | ICD-10-CM

## 2019-02-05 DIAGNOSIS — M3501 Sicca syndrome with keratoconjunctivitis: Secondary | ICD-10-CM | POA: Diagnosis not present

## 2019-02-05 DIAGNOSIS — M5136 Other intervertebral disc degeneration, lumbar region: Secondary | ICD-10-CM

## 2019-02-05 DIAGNOSIS — M19042 Primary osteoarthritis, left hand: Secondary | ICD-10-CM

## 2019-02-05 DIAGNOSIS — Z87442 Personal history of urinary calculi: Secondary | ICD-10-CM

## 2019-02-05 DIAGNOSIS — M810 Age-related osteoporosis without current pathological fracture: Secondary | ICD-10-CM | POA: Diagnosis not present

## 2019-02-05 DIAGNOSIS — M797 Fibromyalgia: Secondary | ICD-10-CM | POA: Diagnosis not present

## 2019-02-05 DIAGNOSIS — Z8669 Personal history of other diseases of the nervous system and sense organs: Secondary | ICD-10-CM

## 2019-02-05 DIAGNOSIS — Z79899 Other long term (current) drug therapy: Secondary | ICD-10-CM | POA: Diagnosis not present

## 2019-02-05 DIAGNOSIS — E785 Hyperlipidemia, unspecified: Secondary | ICD-10-CM

## 2019-02-05 DIAGNOSIS — M19041 Primary osteoarthritis, right hand: Secondary | ICD-10-CM

## 2019-02-05 DIAGNOSIS — I10 Essential (primary) hypertension: Secondary | ICD-10-CM

## 2019-02-05 DIAGNOSIS — K200 Eosinophilic esophagitis: Secondary | ICD-10-CM

## 2019-02-05 NOTE — Patient Instructions (Signed)
Hip Exercises Ask your health care provider which exercises are safe for you. Do exercises exactly as told by your health care provider and adjust them as directed. It is normal to feel mild stretching, pulling, tightness, or discomfort as you do these exercises. Stop right away if you feel sudden pain or your pain gets worse. Do not begin these exercises until told by your health care provider. Stretching and range-of-motion exercises These exercises warm up your muscles and joints and improve the movement and flexibility of your hip. These exercises also help to relieve pain, numbness, and tingling. You may be asked to limit your range of motion if you had a hip replacement. Talk to your health care provider about these restrictions. Hamstrings, supine  1. Lie on your back (supine position). 2. Loop a belt or towel over the ball of your left / right foot. The ball of your foot is on the walking surface, right under your toes. 3. Straighten your left / right knee and slowly pull on the belt or towel to raise your leg until you feel a gentle stretch behind your knee (hamstring). ? Do not let your knee bend while you do this. ? Keep your other leg flat on the floor. 4. Hold this position for __________ seconds. 5. Slowly return your leg to the starting position. Repeat __________ times. Complete this exercise __________ times a day. Hip rotation  1. Lie on your back on a firm surface. 2. With your left / right hand, gently pull your left / right knee toward the shoulder that is on the same side of the body. Stop when your knee is pointing toward the ceiling. 3. Hold your left / right ankle with your other hand. 4. Keeping your knee steady, gently pull your left / right ankle toward your other shoulder until you feel a stretch in your buttocks. ? Keep your hips and shoulders firmly planted while you do this stretch. 5. Hold this position for __________ seconds. Repeat __________ times. Complete  this exercise __________ times a day. Seated stretch This exercise is sometimes called hamstrings and adductors stretch. 1. Sit on the floor with your legs stretched wide. Keep your knees straight during this exercise. 2. Keeping your head and back in a straight line, bend at your waist to reach for your left foot (position A). You should feel a stretch in your right inner thigh (adductors). 3. Hold this position for __________ seconds. Then slowly return to the upright position. 4. Keeping your head and back in a straight line, bend at your waist to reach forward (position B). You should feel a stretch behind both of your thighs and knees (hamstrings). 5. Hold this position for __________ seconds. Then slowly return to the upright position. 6. Keeping your head and back in a straight line, bend at your waist to reach for your right foot (position C). You should feel a stretch in your left inner thigh (adductors). 7. Hold this position for __________ seconds. Then slowly return to the upright position. Repeat __________ times. Complete this exercise __________ times a day. Lunge This exercise stretches the muscles of the hip (hip flexors). 1. Place your left / right knee on the floor and bend your other knee so that is directly over your ankle. You should be half-kneeling. 2. Keep good posture with your head over your shoulders. 3. Tighten your buttocks to point your tailbone downward. This will prevent your back from arching too much. 4. You should feel a  gentle stretch in the front of your left / right thigh and hip. If you do not feel a stretch, slide your other foot forward slightly and then slowly lunge forward with your chest up until your knee once again lines up over your ankle. ? Make sure your tailbone continues to point downward. 5. Hold this position for __________ seconds. 6. Slowly return to the starting position. Repeat __________ times. Complete this exercise __________ times a  day. Strengthening exercises These exercises build strength and endurance in your hip. Endurance is the ability to use your muscles for a long time, even after they get tired. Bridge This exercise strengthens the muscles of your hip (hip extensors). 1. Lie on your back on a firm surface with your knees bent and your feet flat on the floor. 2. Tighten your buttocks muscles and lift your bottom off the floor until the trunk of your body and your hips are level with your thighs. ? Do not arch your back. ? You should feel the muscles working in your buttocks and the back of your thighs. If you do not feel these muscles, slide your feet 1-2 inches (2.5-5 cm) farther away from your buttocks. 3. Hold this position for __________ seconds. 4. Slowly lower your hips to the starting position. 5. Let your muscles relax completely between repetitions. Repeat __________ times. Complete this exercise __________ times a day. Straight leg raises, side-lying This exercise strengthens the muscles that move the hip joint away from the center of the body (hip abductors). 1. Lie on your side with your left / right leg in the top position. Lie so your head, shoulder, hip, and knee line up. You may bend your bottom knee slightly to help you balance. 2. Roll your hips slightly forward, so your hips are stacked directly over each other and your left / right knee is facing forward. 3. Leading with your heel, lift your top leg 4-6 inches (10-15 cm). You should feel the muscles in your top hip lifting. ? Do not let your foot drift forward. ? Do not let your knee roll toward the ceiling. 4. Hold this position for __________ seconds. 5. Slowly return to the starting position. 6. Let your muscles relax completely between repetitions. Repeat __________ times. Complete this exercise __________ times a day. Straight leg raises, side-lying This exercise strengthen the muscles that move the hip joint toward the center of the  body (hip adductors). 1. Lie on your side with your left / right leg in the bottom position. Lie so your head, shoulder, hip, and knee line up. You may place your upper foot in front to help you balance. 2. Roll your hips slightly forward, so your hips are stacked directly over each other and your left / right knee is facing forward. 3. Tense the muscles in your inner thigh and lift your bottom leg 4-6 inches (10-15 cm). 4. Hold this position for __________ seconds. 5. Slowly return to the starting position. 6. Let your muscles relax completely between repetitions. Repeat __________ times. Complete this exercise __________ times a day. Straight leg raises, supine This exercise strengthens the muscles in the front of your thigh (quadriceps). 1. Lie on your back (supine position) with your left / right leg extended and your other knee bent. 2. Tense the muscles in the front of your left / right thigh. You should see your kneecap slide up or see increased dimpling just above your knee. 3. Keep these muscles tight as you raise your   leg 4-6 inches (10-15 cm) off the floor. Do not let your knee bend. 4. Hold this position for __________ seconds. 5. Keep these muscles tense as you lower your leg. 6. Relax the muscles slowly and completely between repetitions. Repeat __________ times. Complete this exercise __________ times a day. Hip abductors, standing This exercise strengthens the muscles that move the leg and hip joint away from the center of the body (hip abductors). 1. Tie one end of a rubber exercise band or tubing to a secure surface, such as a chair, table, or pole. 2. Loop the other end of the band or tubing around your left / right ankle. 3. Keeping your ankle with the band or tubing directly opposite the secured end, step away until there is tension in the tubing or band. Hold on to a chair, table, or pole as needed for balance. 4. Lift your left / right leg out to your side. While you do  this: ? Keep your back upright. ? Keep your shoulders over your hips. ? Keep your toes pointing forward. ? Make sure to use your hip muscles to slowly lift your leg. Do not tip your body or forcefully lift your leg. 5. Hold this position for __________ seconds. 6. Slowly return to the starting position. Repeat __________ times. Complete this exercise __________ times a day. Squats This exercise strengthens the muscles in the front of your thigh (quadriceps). 1. Stand in a door frame so your feet and knees are in line with the frame. You may place your hands on the frame for balance. 2. Slowly bend your knees and lower your hips like you are going to sit in a chair. ? Keep your lower legs in a straight-up-and-down position. ? Do not let your hips go lower than your knees. ? Do not bend your knees lower than told by your health care provider. ? If your hip pain increases, do not bend as low. 3. Hold this position for ___________ seconds. 4. Slowly push with your legs to return to standing. Do not use your hands to pull yourself to standing. Repeat __________ times. Complete this exercise __________ times a day. This information is not intended to replace advice given to you by your health care provider. Make sure you discuss any questions you have with your health care provider. Document Released: 07/07/2005 Document Revised: 04/30/2018 Document Reviewed: 04/30/2018 [employer] Patient Education  2020 [place].  Back Exercises The following exercises strengthen the muscles that help to support the trunk and back. They also help to keep the lower back flexible. Doing these exercises can help to prevent back pain or lessen existing pain.  If you have back pain or discomfort, try doing these exercises 2-3 times each day or as told by your health care provider.  As your pain improves, do them once each day, but increase the number of times that you repeat the steps for each exercise (do more  repetitions).  To prevent the recurrence of back pain, continue to do these exercises once each day or as told by your health care provider. Do exercises exactly as told by your health care provider and adjust them as directed. It is normal to feel mild stretching, pulling, tightness, or discomfort as you do these exercises, but you should stop right away if you feel sudden pain or your pain gets worse. Exercises Single knee to chest Repeat these steps 3-5 times for each leg: 1. Lie on your back on a firm bed or  the floor with your legs extended. 2. Bring one knee to your chest. Your other leg should stay extended and in contact with the floor. 3. Hold your knee in place by grabbing your knee or thigh with both hands and hold. 4. Pull on your knee until you feel a gentle stretch in your lower back or buttocks. 5. Hold the stretch for 10-30 seconds. 6. Slowly release and straighten your leg. Pelvic tilt Repeat these steps 5-10 times: 1. Lie on your back on a firm bed or the floor with your legs extended. 2. Bend your knees so they are pointing toward the ceiling and your feet are flat on the floor. 3. Tighten your lower abdominal muscles to press your lower back against the floor. This motion will tilt your pelvis so your tailbone points up toward the ceiling instead of pointing to your feet or the floor. 4. With gentle tension and even breathing, hold this position for 5-10 seconds. Cat-cow Repeat these steps until your lower back becomes more flexible: 1. Get into a hands-and-knees position on a firm surface. Keep your hands under your shoulders, and keep your knees under your hips. You may place padding under your knees for comfort. 2. Let your head hang down toward your chest. Contract your abdominal muscles and point your tailbone toward the floor so your lower back becomes rounded like the back of a cat. 3. Hold this position for 5 seconds. 4. Slowly lift your head, let your abdominal  muscles relax and point your tailbone up toward the ceiling so your back forms a sagging arch like the back of a cow. 5. Hold this position for 5 seconds.  Press-ups Repeat these steps 5-10 times: 1. Lie on your abdomen (face-down) on the floor. 2. Place your palms near your head, about shoulder-width apart. 3. Keeping your back as relaxed as possible and keeping your hips on the floor, slowly straighten your arms to raise the top half of your body and lift your shoulders. Do not use your back muscles to raise your upper torso. You may adjust the placement of your hands to make yourself more comfortable. 4. Hold this position for 5 seconds while you keep your back relaxed. 5. Slowly return to lying flat on the floor.  Bridges Repeat these steps 10 times: 1. Lie on your back on a firm surface. 2. Bend your knees so they are pointing toward the ceiling and your feet are flat on the floor. Your arms should be flat at your sides, next to your body. 3. Tighten your buttocks muscles and lift your buttocks off the floor until your waist is at almost the same height as your knees. You should feel the muscles working in your buttocks and the back of your thighs. If you do not feel these muscles, slide your feet 1-2 inches farther away from your buttocks. 4. Hold this position for 3-5 seconds. 5. Slowly lower your hips to the starting position, and allow your buttocks muscles to relax completely. If this exercise is too easy, try doing it with your arms crossed over your chest. Abdominal crunches Repeat these steps 5-10 times: 1. Lie on your back on a firm bed or the floor with your legs extended. 2. Bend your knees so they are pointing toward the ceiling and your feet are flat on the floor. 3. Cross your arms over your chest. 4. Tip your chin slightly toward your chest without bending your neck. 5. Tighten your abdominal muscles and slowly  raise your trunk (torso) high enough to lift your shoulder  blades a tiny bit off the floor. Avoid raising your torso higher than that because it can put too much stress on your low back and does not help to strengthen your abdominal muscles. 6. Slowly return to your starting position. Back lifts Repeat these steps 5-10 times: 1. Lie on your abdomen (face-down) with your arms at your sides, and rest your forehead on the floor. 2. Tighten the muscles in your legs and your buttocks. 3. Slowly lift your chest off the floor while you keep your hips pressed to the floor. Keep the back of your head in line with the curve in your back. Your eyes should be looking at the floor. 4. Hold this position for 3-5 seconds. 5. Slowly return to your starting position. Contact a health care provider if:  Your back pain or discomfort gets much worse when you do an exercise.  Your worsening back pain or discomfort does not lessen within 2 hours after you exercise. If you have any of these problems, stop doing these exercises right away. Do not do them again unless your health care provider says that you can. Get help right away if:  You develop sudden, severe back pain. If this happens, stop doing the exercises right away. Do not do them again unless your health care provider says that you can. This information is not intended to replace advice given to you by your health care provider. Make sure you discuss any questions you have with your health care provider. Document Released: 07/27/2004 Document Revised: 10/24/2018 Document Reviewed: 03/21/2018 Elsevier Patient Education  2020 [place] We placed an order today for your standing lab work.    Please come back and get your standing labs in September and every 5 months  We have open lab daily Monday through Thursday from 8:30-12:30 PM and 1:30-4:30 PM and Friday from 8:30-12:30 PM and 1:30 -4:00 PM at the office of Dr. Bo Merino.   You may experience shorter wait times on Monday and  Friday afternoons. The office is located at [address] [place], [place], [place] [address] No appointment is necessary.   Labs are drawn by [facility].  You may receive a bill from [facility] for your lab work.  If you wish to have your labs drawn at another location, please call the office 24 hours in advance to send orders.  If you have any questions regarding directions or hours of operation,  please call [phone].   Just as a reminder please drink plenty of water prior to coming for your lab work. Thanks!

## 2019-02-11 DIAGNOSIS — E1149 Type 2 diabetes mellitus with other diabetic neurological complication: Secondary | ICD-10-CM | POA: Diagnosis not present

## 2019-02-11 DIAGNOSIS — E038 Other specified hypothyroidism: Secondary | ICD-10-CM | POA: Diagnosis not present

## 2019-02-11 DIAGNOSIS — E7849 Other hyperlipidemia: Secondary | ICD-10-CM | POA: Diagnosis not present

## 2019-02-17 DIAGNOSIS — I10 Essential (primary) hypertension: Secondary | ICD-10-CM | POA: Diagnosis not present

## 2019-02-17 DIAGNOSIS — R82998 Other abnormal findings in urine: Secondary | ICD-10-CM | POA: Diagnosis not present

## 2019-02-18 DIAGNOSIS — D649 Anemia, unspecified: Secondary | ICD-10-CM | POA: Diagnosis not present

## 2019-02-18 DIAGNOSIS — Z0000 Encounter for general adult medical examination without abnormal findings: Secondary | ICD-10-CM | POA: Diagnosis not present

## 2019-02-18 DIAGNOSIS — M3210 Systemic lupus erythematosus, organ or system involvement unspecified: Secondary | ICD-10-CM | POA: Diagnosis not present

## 2019-02-18 DIAGNOSIS — F339 Major depressive disorder, recurrent, unspecified: Secondary | ICD-10-CM | POA: Diagnosis not present

## 2019-02-18 DIAGNOSIS — E039 Hypothyroidism, unspecified: Secondary | ICD-10-CM | POA: Diagnosis not present

## 2019-02-18 DIAGNOSIS — M810 Age-related osteoporosis without current pathological fracture: Secondary | ICD-10-CM | POA: Diagnosis not present

## 2019-02-18 DIAGNOSIS — N183 Chronic kidney disease, stage 3 (moderate): Secondary | ICD-10-CM | POA: Diagnosis not present

## 2019-02-18 DIAGNOSIS — I129 Hypertensive chronic kidney disease with stage 1 through stage 4 chronic kidney disease, or unspecified chronic kidney disease: Secondary | ICD-10-CM | POA: Diagnosis not present

## 2019-02-18 DIAGNOSIS — Z1331 Encounter for screening for depression: Secondary | ICD-10-CM | POA: Diagnosis not present

## 2019-02-18 DIAGNOSIS — G250 Essential tremor: Secondary | ICD-10-CM | POA: Diagnosis not present

## 2019-02-18 DIAGNOSIS — E1149 Type 2 diabetes mellitus with other diabetic neurological complication: Secondary | ICD-10-CM | POA: Diagnosis not present

## 2019-02-18 DIAGNOSIS — M797 Fibromyalgia: Secondary | ICD-10-CM | POA: Diagnosis not present

## 2019-02-18 DIAGNOSIS — E785 Hyperlipidemia, unspecified: Secondary | ICD-10-CM | POA: Diagnosis not present

## 2019-02-19 ENCOUNTER — Encounter: Payer: Self-pay | Admitting: Neurology

## 2019-02-22 DIAGNOSIS — J069 Acute upper respiratory infection, unspecified: Secondary | ICD-10-CM | POA: Diagnosis not present

## 2019-02-22 DIAGNOSIS — Z03818 Encounter for observation for suspected exposure to other biological agents ruled out: Secondary | ICD-10-CM | POA: Diagnosis not present

## 2019-02-22 DIAGNOSIS — R0602 Shortness of breath: Secondary | ICD-10-CM | POA: Diagnosis not present

## 2019-02-22 DIAGNOSIS — B9689 Other specified bacterial agents as the cause of diseases classified elsewhere: Secondary | ICD-10-CM | POA: Diagnosis not present

## 2019-02-22 DIAGNOSIS — R05 Cough: Secondary | ICD-10-CM | POA: Diagnosis not present

## 2019-02-22 DIAGNOSIS — R509 Fever, unspecified: Secondary | ICD-10-CM | POA: Diagnosis not present

## 2019-02-27 DIAGNOSIS — Z23 Encounter for immunization: Secondary | ICD-10-CM | POA: Diagnosis not present

## 2019-03-06 DIAGNOSIS — R809 Proteinuria, unspecified: Secondary | ICD-10-CM | POA: Diagnosis not present

## 2019-03-06 DIAGNOSIS — D631 Anemia in chronic kidney disease: Secondary | ICD-10-CM | POA: Diagnosis not present

## 2019-03-06 DIAGNOSIS — M329 Systemic lupus erythematosus, unspecified: Secondary | ICD-10-CM | POA: Diagnosis not present

## 2019-03-06 DIAGNOSIS — E039 Hypothyroidism, unspecified: Secondary | ICD-10-CM | POA: Diagnosis not present

## 2019-03-06 DIAGNOSIS — I129 Hypertensive chronic kidney disease with stage 1 through stage 4 chronic kidney disease, or unspecified chronic kidney disease: Secondary | ICD-10-CM | POA: Diagnosis not present

## 2019-03-06 DIAGNOSIS — N181 Chronic kidney disease, stage 1: Secondary | ICD-10-CM | POA: Diagnosis not present

## 2019-03-06 DIAGNOSIS — E785 Hyperlipidemia, unspecified: Secondary | ICD-10-CM | POA: Diagnosis not present

## 2019-03-27 NOTE — Progress Notes (Signed)
Veronica Jordan was seen today in the movement disorders clinic for neurologic consultation at the request of Marton Redwood, MD.  The consultation is for the evaluation of left hand tremor.  Tremor: Yes.     How long has it been going on? 1 year ago  At rest or with activation?  rest  Fam hx of tremor?  Yes.  , maternal GM and maternal uncle with PD  Located where?  L hand about a year ago; left leg about 3-4 months ago  Affected by caffeine:  No.  Affected by alcohol:  Doesn't drink alcohol  Affected by stress:  Yes.    Affected by fatigue:  Yes.    Spills soup if on spoon:  No. because she is R handed  Tremor inducing meds:  No.  Other Specific Symptoms:  Voice: gotten weaker Sleep: trouble getting to sleep  Vivid Dreams:  No.  Acting out dreams:  Yes.  , cries out per husband Wet Pillows: Yes.   Postural symptoms:  Yes.    Falls?  Yes.  , last fall was a few months ago but cannot remember details Bradykinesia symptoms: shuffling gait, difficulty getting out of a chair and difficulty regaining balance Loss of smell:  Yes.   Loss of taste:  Yes.  , thinks related to sjogrens Urinary Incontinence:  No., mild wears a pad.  Has frequency Difficulty Swallowing:  Yes.   Handwriting, micrographia: No. Trouble with ADL's:  No.  Trouble buttoning clothing: No. Depression:  Yes.  , on medication that she thinks helps Memory changes:  Yes.  , mild but she is going back to school online to get her degree Hallucinations:  No.  visual distortions: No. N/V:  No. Lightheaded:  Yes.  , " a few times but not consistent"  Syncope: No. Diplopia:  No. Dyskinesia:  No.   Patient had an MRI of the brain in October, 2017.  I had the opportunity to review this.  There was at least moderate small vessel disease.  The left brain was more affected than the right brain.  Contrasted images were unremarkable.  PREVIOUS MEDICATIONS: none to date  ALLERGIES:   Allergies  Allergen Reactions  .  Clindamycin/Lincomycin     hives  . Codeine Itching  . Erythromycin   . Meperidine Hcl   . Ondansetron Hcl   . Penicillins   . Sulfonamide Derivatives   . Zofran [Ondansetron Hcl]     CURRENT MEDICATIONS:  Current Outpatient Medications  Medication Instructions  . amLODipine (NORVASC) 10 mg, Daily  . aspirin 81 mg, Daily  . Calcium Carbonate-Vitamin D (CALTRATE 600+D) 600-400 MG-UNIT per tablet 1 tablet, Oral, Daily  . Coenzyme Q10 (COQ-10) 50 MG CAPS 1 capsule, Daily  . cycloSPORINE (RESTASIS) 0.05 % ophthalmic emulsion 1 drop, Daily  . DIOVAN 320 MG tablet No dose, route, or frequency recorded.  . escitalopram (LEXAPRO) 10 MG tablet Daily  . fluticasone (FLONASE) 50 MCG/ACT nasal spray SHAKE LQ AND U 2 SPRAYS IEN D  . hydroxychloroquine (PLAQUENIL) 200 MG tablet TAKE 1 TABLET DAILY (SYSTEMIC LUPUS ERYTHEMATOSUS)  . levothyroxine (SYNTHROID) 25 mcg, Daily  . losartan (COZAAR) 100 mg, Daily  . pantoprazole (PROTONIX) 40 MG tablet Daily  . simvastatin (ZOCOR) 20 mg, Daily at bedtime    PAST MEDICAL HISTORY:   Past Medical History:  Diagnosis Date  . Allergic rhinitis   . Anemia   . Anxiety and depression   . Depression   . Diabetes  mellitus   . Diverticulitis   . Fibromyalgia   . GERD (gastroesophageal reflux disease)   . History of gallstones   . History of kidney stones   . Hyperlipemia   . Hypertension   . Hypothyroidism   . IBS (irritable bowel syndrome)   . Osteoarthritis   . Osteoporosis   . Renal calculi   . Sjogren's syndrome ([place])   . Systemic lupus ([place]) 2001    PAST SURGICAL HISTORY:   Past Surgical History:  Procedure Laterality Date  . APPENDECTOMY  1970  . BREAST EXCISIONAL BIOPSY Left   . CATARACT EXTRACTION     bilateral  . [place]   x2  . CHOLECYSTECTOMY  1988  . TONSILLECTOMY AND ADENOIDECTOMY    . TOTAL ABDOMINAL HYSTERECTOMY  2000   BSO due to fibroids    SOCIAL HISTORY:   Social History   Socioeconomic  History  . Marital status: Married    Spouse name: Not on file  . Number of children: 2  . Years of education: Not on file  . Highest education level: Associate degree: academic program  Occupational History  . 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 Financial resource strain: Not on file  . Food insecurity    Worry: Not on file    Inability: Not on file  . Transportation needs    Medical: Not on file    Non-medical: Not on file  Tobacco Use  . Smoking status: Never Smoker  . Smokeless tobacco: Never Used  Substance and Sexual Activity  . Alcohol use: No    Comment: wine occ  . Drug use: No  . Sexual activity: Not Currently    Birth control/protection: Post-menopausal, Surgical    Comment: hysterectomy  Lifestyle  . Physical activity    Days per week: Not on file    Minutes per session: Not on file  . Stress: Not on file  Relationships  . Social Herbalist on phone: Not on file    Gets together: Not on file    Attends religious service: Not on file    Active member of club or organization: Not on file    Attends meetings of clubs or organizations: Not on file    Relationship status: Not on file  . Intimate partner violence    Fear of current or ex partner: Not on file    Emotionally abused: Not on file    Physically abused: Not on file    Forced sexual activity: Not on file  Other Topics Concern  . Not on file  Social History Narrative   Youngest daughter died in [place].    FAMILY HISTORY:   Family Status  Relation Name Status  . Mother  Alive  . Father  Deceased at age 84       CHF  . Sister [employer]  . Other  (Not Specified)  . Brother [employer]  . MGM  Deceased  . MGF  Deceased  . PGM  Deceased  . PGF  Deceased  . Sister Burman Foster  . Daughter Cammy Brochure  . Daughter Albina Billet Deceased at age 43  . Daughter Neoma Laming Deceased at age 9 days   . Neg Hx  (Not Specified)     ROS:  Review of Systems  Constitutional: Negative.   HENT: Negative.   Eyes: Negative.   Respiratory: Positive for  shortness of breath (intermittent DOE).   Cardiovascular: Negative.   Gastrointestinal: Positive for constipation (IBS) and diarrhea.  Genitourinary: Positive for frequency and urgency.  Musculoskeletal: Positive for falls.  Skin: Negative.   Neurological: Positive for weakness (generalized).  Psychiatric/Behavioral: Positive for depression.    PHYSICAL EXAMINATION:    VITALS:   Vitals:   03/31/19 1258  BP: (!) 102/54  Pulse: 65  SpO2: 98%  Weight: 121 lb 14.4 oz (55.3 kg)  Height: 4' 11"  (1.499 m)    GEN:  The patient appears stated age and is in NAD. HEENT:  Normocephalic, atraumatic.  The mucous membranes are moist. The superficial temporal arteries are without ropiness or tenderness. CV:  RRR Lungs:  CTAB Neck/HEME:  There are no carotid bruits bilaterally.  Neurological examination:  Orientation: The patient is alert and oriented x3. Fund of knowledge is appropriate.  Recent and remote memory are intact.  Attention and concentration are normal.    Able to name objects and repeat phrases. Cranial nerves: There is good facial symmetry.  Theree is facial hypomimia.  Extraocular muscles are intact. The visual fields are full to confrontational testing. The speech is fluent and clear. Soft palate rises symmetrically and there is no tongue deviation. Hearing is intact to conversational tone. Sensation: Sensation is intact to light and pinprick throughout (facial, trunk, extremities). Vibration is intact at the bilateral big toe. There is no extinction with double simultaneous stimulation. There is no sensory dermatomal level identified. Motor: Strength is 5/5 in the bilateral upper and lower extremities.   Shoulder shrug is equal and symmetric.  There is no pronator drift. Deep tendon reflexes: Deep tendon reflexes are 2-/4 at the bilateral biceps, triceps,  brachioradialis, patella and achilles. Plantar responses are downgoing bilaterally.  Movement examination: Tone: There is mild increased tone in the LLE Abnormal movements: there is LUE>LLE resting tremor that increases with distraction Coordination:  There is  decremation with RAM's, with any form of RAMS, including alternating supination and pronation of the forearm, hand opening and closing, finger taps on the L.  All other RAMs are good on the L and R. Gait and Station: The patient has minimal difficulty arising out of a deep-seated chair without the use of the hands. The patient's stride length is decreased but no shuffling.  She has LUE rest tremor with ambulation.     Labs: It was dated February 11 2019.  White blood cells were 5.5, hemoglobin 11.7, platelets 184.  Sodium was 139, potassium 4.1, chloride 106, CO2 23, BUN 13, creatinine 1.1, AST 16, ALT 11, TSH 3.13, hemoglobin A1c 5.8  ASSESSMENT/PLAN:  1. Parkinson's disease.  The patient has tremor, bradykinesia, rigidity and mild postural instability.  This is likely familial given strong fam hx.  Discussed that we may need to do gene testing in the future should targeted gene therapy come to the market.  Her maternal uncle and GM had PD    -We discussed the diagnosis as well as pathophysiology of the disease.  We discussed treatment options as well as prognostic indicators.  Patient education was provided.  -We discussed that it used to be thought that levodopa would increase risk of melanoma but now it is believed that Parkinsons itself likely increases risk of melanoma. she is to get regular skin checks.  -Greater than 50% of the 60 minute visit was spent in counseling answering questions and talking about what to expect now as well as in the future.  We talked about medication  options as well as potential future surgical options.  We talked about safety in the home.  -We decided to add carbidopa/levodopa 25/100.  1/2 tab tid x 1 wk, then  1/2 in am & noon & 1 at night for a week, then 1/2 in am &1 at noon &night for a week, then 1 po tid.  Risks, benefits, side effects and alternative therapies were discussed.  The opportunity to ask questions was given and they were answered to the best of my ability.  The patient expressed understanding and willingness to follow the outlined treatment protocols.  -I will refer the patient  for [facility] [place]  -We discussed community resources in the area including patient support groups and community exercise programs for PD and pt education was provided to the patient.  2.  Depression  -strongly recommended counseling  -met with my social worker today  3.  Lupus, sjogrens and fibromyalgia  -following with Dr. Patrecia Pour of rheumatology  4.  Sialorrhea  -This is commonly associated with PD.  We talked about treatments.  The patient is not a candidate for oral anticholinergic therapy because of increased risk of confusion and falls.  We discussed Botox (type A and B) and 1% atropine drops.  We discusssed that candy like lemon drops can help by stimulating mm of the oropharynx to induce swallowing.  5.  Follow up is anticipated in the next 4-6 months, sooner should new neurologic issues arise.  Much greater than 50% of this visit was spent in counseling and coordinating care.  Total face to face time:  60 min   Cc:  Marton Redwood, MD

## 2019-03-31 ENCOUNTER — Ambulatory Visit (INDEPENDENT_AMBULATORY_CARE_PROVIDER_SITE_OTHER): Payer: Medicare Other | Admitting: Neurology

## 2019-03-31 ENCOUNTER — Encounter: Payer: Self-pay | Admitting: Neurology

## 2019-03-31 ENCOUNTER — Other Ambulatory Visit: Payer: Self-pay

## 2019-03-31 ENCOUNTER — Other Ambulatory Visit: Payer: Self-pay | Admitting: *Deleted

## 2019-03-31 VITALS — BP 102/54 | HR 65 | Ht 59.0 in | Wt 121.9 lb

## 2019-03-31 DIAGNOSIS — K117 Disturbances of salivary secretion: Secondary | ICD-10-CM

## 2019-03-31 DIAGNOSIS — Z79899 Other long term (current) drug therapy: Secondary | ICD-10-CM | POA: Diagnosis not present

## 2019-03-31 DIAGNOSIS — G20 Parkinson's disease: Secondary | ICD-10-CM | POA: Diagnosis not present

## 2019-03-31 DIAGNOSIS — M3219 Other organ or system involvement in systemic lupus erythematosus: Secondary | ICD-10-CM

## 2019-03-31 MED ORDER — CARBIDOPA-LEVODOPA 25-100 MG PO TABS: 1.0000 | ORAL_TABLET | Freq: Three times a day (TID) | ORAL | 1 refills | Status: DC

## 2019-03-31 NOTE — Progress Notes (Signed)
[facility] P# [phone] F# [phone]

## 2019-03-31 NOTE — Addendum Note (Signed)
Addended by: Ranae Plumber on: 03/31/2019 02:02 PM   Modules accepted: Orders

## 2019-03-31 NOTE — Patient Instructions (Signed)
Start Carbidopa Levodopa as follows:  Take 1/2 tablet three times daily, at least 30 minutes before meals, for one week  Then take 1/2 tablet in the morning, 1/2 tablet in the afternoon, 1 tablet in the evening, at least 30 minutes before meals, for one week  Then take 1/2 tablet in the morning, 1 tablet in the afternoon, 1 tablet in the evening, at least 30 minutes before meals, for one week  Then take 1 tablet three times daily, at least 30 minutes before meals, at 9am/1pm/5pm   As a reminder, carbidopa/levodopa can be taken at the same time as a carbohydrate, but we like to have you take your pill either 30 minutes before a protein source or 1 hour after as protein can interfere with carbidopa/levodopa absorption.

## 2019-04-01 DIAGNOSIS — R2681 Unsteadiness on feet: Secondary | ICD-10-CM | POA: Diagnosis not present

## 2019-04-01 DIAGNOSIS — R296 Repeated falls: Secondary | ICD-10-CM | POA: Diagnosis not present

## 2019-04-01 DIAGNOSIS — G20 Parkinson's disease: Secondary | ICD-10-CM | POA: Diagnosis not present

## 2019-04-01 DIAGNOSIS — M6281 Muscle weakness (generalized): Secondary | ICD-10-CM | POA: Diagnosis not present

## 2019-04-01 NOTE — Progress Notes (Signed)
Patient's GFR is still.  Please ask patient if she is a still seeing the nephrologist.  If she has not seen a nephrologist she will need appointment.  She used to see nephrologist in the past.

## 2019-04-03 LAB — COMPLETE METABOLIC PANEL WITH GFR
AG Ratio: 1.5 (calc) (ref 1.0–2.5)
ALT: 9 U/L (ref 6–29)
AST: 19 U/L (ref 10–35)
Albumin: 4.6 g/dL (ref 3.6–5.1)
Alkaline phosphatase (APISO): 76 U/L (ref 37–153)
BUN/Creatinine Ratio: 15 (calc) (ref 6–22)
BUN: 16 mg/dL (ref 7–25)
CO2: 26 mmol/L (ref 20–32)
Calcium: 9.4 mg/dL (ref 8.6–10.4)
Chloride: 104 mmol/L (ref 98–110)
Creat: 1.09 mg/dL — ABNORMAL HIGH (ref 0.60–0.93)
GFR, Est African American: 59 mL/min/{1.73_m2} — ABNORMAL LOW (ref >=60)
GFR, Est Non African American: 51 mL/min/{1.73_m2} — ABNORMAL LOW (ref >=60)
Globulin: 3.1 g/dL (calc) (ref 1.9–3.7)
Glucose, Bld: 92 mg/dL (ref 65–99)
Potassium: 4 mmol/L (ref 3.5–5.3)
Sodium: 141 mmol/L (ref 135–146)
Total Bilirubin: 0.5 mg/dL (ref 0.2–1.2)
Total Protein: 7.7 g/dL (ref 6.1–8.1)

## 2019-04-03 LAB — URINALYSIS, ROUTINE W REFLEX MICROSCOPIC
Bacteria, UA: NONE SEEN /HPF
Bilirubin Urine: NEGATIVE
Glucose, UA: NEGATIVE
Hgb urine dipstick: NEGATIVE
Nitrite: NEGATIVE
RBC / HPF: NONE SEEN /HPF (ref 0–2)
Specific Gravity, Urine: 1.028 (ref 1.001–1.03)
pH: 5.5 (ref 5.0–8.0)

## 2019-04-03 LAB — CBC WITH DIFFERENTIAL/PLATELET
Absolute Monocytes: 616 cells/uL (ref 200–950)
Basophils Absolute: 73 cells/uL (ref 0–200)
Basophils Relative: 1.2 %
Eosinophils Absolute: 232 cells/uL (ref 15–500)
Eosinophils Relative: 3.8 %
HCT: 34.2 % — ABNORMAL LOW (ref 35.0–45.0)
Hemoglobin: 11.2 g/dL — ABNORMAL LOW (ref 11.7–15.5)
Lymphs Abs: 1391 cells/uL (ref 850–3900)
MCH: 29.2 pg (ref 27.0–33.0)
MCHC: 32.7 g/dL (ref 32.0–36.0)
MCV: 89.1 fL (ref 80.0–100.0)
MPV: 11.1 fL (ref 7.5–12.5)
Monocytes Relative: 10.1 %
Neutro Abs: 3788 cells/uL (ref 1500–7800)
Neutrophils Relative %: 62.1 %
Platelets: 189 10*3/uL (ref 140–400)
RBC: 3.84 10*6/uL (ref 3.80–5.10)
RDW: 12.5 % (ref 11.0–15.0)
Total Lymphocyte: 22.8 %
WBC: 6.1 10*3/uL (ref 3.8–10.8)

## 2019-04-03 LAB — ANTI-DNA ANTIBODY, DOUBLE-STRANDED: ds DNA Ab: 4 IU/mL

## 2019-04-03 LAB — C3 AND C4
C3 Complement: 165 mg/dL (ref 83–193)
C4 Complement: 26 mg/dL (ref 15–57)

## 2019-04-03 LAB — SEDIMENTATION RATE: Sed Rate: 36 mm/h — ABNORMAL HIGH (ref 0–30)

## 2019-04-03 LAB — ANA: Anti Nuclear Antibody (ANA): POSITIVE — AB

## 2019-04-03 LAB — ANTI-NUCLEAR AB-TITER (ANA TITER): ANA Titer 1: 1:1280 {titer} — ABNORMAL HIGH

## 2019-04-03 NOTE — Progress Notes (Signed)
Patient has 1+ proteinuria and decrease in GFR.  I would recommend her to see her nephrologist.

## 2019-04-10 DIAGNOSIS — R296 Repeated falls: Secondary | ICD-10-CM | POA: Diagnosis not present

## 2019-04-10 DIAGNOSIS — M6281 Muscle weakness (generalized): Secondary | ICD-10-CM | POA: Diagnosis not present

## 2019-04-10 DIAGNOSIS — R2681 Unsteadiness on feet: Secondary | ICD-10-CM | POA: Diagnosis not present

## 2019-04-10 DIAGNOSIS — G20 Parkinson's disease: Secondary | ICD-10-CM | POA: Diagnosis not present

## 2019-04-11 ENCOUNTER — Other Ambulatory Visit: Payer: Self-pay | Admitting: Rheumatology

## 2019-04-11 DIAGNOSIS — M3219 Other organ or system involvement in systemic lupus erythematosus: Secondary | ICD-10-CM

## 2019-04-14 ENCOUNTER — Telehealth: Payer: Self-pay | Admitting: Rheumatology

## 2019-04-14 NOTE — Telephone Encounter (Signed)
Christy from [facility] left a voicemail stating they received a Plaquenil form to fill out for the patient.  Alyse Low states patient was only seen one time at their office for a visual field eye exam.  Alyse Low states form should be sent to Dr. Frederico Hamman.

## 2019-04-14 NOTE — Telephone Encounter (Signed)
Please advise 

## 2019-04-14 NOTE — Telephone Encounter (Signed)
Last Visit: 02/05/19 Next Visit: 02/05/19  Labs: 03/31/19 decrease in GFR. PLQ Eye Exam: 02/05/19  Okay to refill per Dr. Estanislado Pandy

## 2019-04-15 DIAGNOSIS — R296 Repeated falls: Secondary | ICD-10-CM | POA: Diagnosis not present

## 2019-04-15 DIAGNOSIS — G20 Parkinson's disease: Secondary | ICD-10-CM | POA: Diagnosis not present

## 2019-04-15 DIAGNOSIS — R2681 Unsteadiness on feet: Secondary | ICD-10-CM | POA: Diagnosis not present

## 2019-04-15 DIAGNOSIS — M6281 Muscle weakness (generalized): Secondary | ICD-10-CM | POA: Diagnosis not present

## 2019-04-15 NOTE — Telephone Encounter (Signed)
Faxed PLQ eye exam form to [facility] to be filled out.

## 2019-04-22 DIAGNOSIS — R296 Repeated falls: Secondary | ICD-10-CM | POA: Diagnosis not present

## 2019-04-22 DIAGNOSIS — G20 Parkinson's disease: Secondary | ICD-10-CM | POA: Diagnosis not present

## 2019-04-22 DIAGNOSIS — R2681 Unsteadiness on feet: Secondary | ICD-10-CM | POA: Diagnosis not present

## 2019-04-22 DIAGNOSIS — M6281 Muscle weakness (generalized): Secondary | ICD-10-CM | POA: Diagnosis not present

## 2019-04-30 DIAGNOSIS — R296 Repeated falls: Secondary | ICD-10-CM | POA: Diagnosis not present

## 2019-04-30 DIAGNOSIS — R2681 Unsteadiness on feet: Secondary | ICD-10-CM | POA: Diagnosis not present

## 2019-04-30 DIAGNOSIS — M6281 Muscle weakness (generalized): Secondary | ICD-10-CM | POA: Diagnosis not present

## 2019-04-30 DIAGNOSIS — G20 Parkinson's disease: Secondary | ICD-10-CM | POA: Diagnosis not present

## 2019-05-06 DIAGNOSIS — R296 Repeated falls: Secondary | ICD-10-CM | POA: Diagnosis not present

## 2019-05-06 DIAGNOSIS — G20 Parkinson's disease: Secondary | ICD-10-CM | POA: Diagnosis not present

## 2019-05-06 DIAGNOSIS — R2681 Unsteadiness on feet: Secondary | ICD-10-CM | POA: Diagnosis not present

## 2019-05-06 DIAGNOSIS — M6281 Muscle weakness (generalized): Secondary | ICD-10-CM | POA: Diagnosis not present

## 2019-05-14 DIAGNOSIS — R2681 Unsteadiness on feet: Secondary | ICD-10-CM | POA: Diagnosis not present

## 2019-05-14 DIAGNOSIS — G20 Parkinson's disease: Secondary | ICD-10-CM | POA: Diagnosis not present

## 2019-05-14 DIAGNOSIS — R296 Repeated falls: Secondary | ICD-10-CM | POA: Diagnosis not present

## 2019-05-14 DIAGNOSIS — M6281 Muscle weakness (generalized): Secondary | ICD-10-CM | POA: Diagnosis not present

## 2019-05-21 DIAGNOSIS — M6281 Muscle weakness (generalized): Secondary | ICD-10-CM | POA: Diagnosis not present

## 2019-05-21 DIAGNOSIS — G20 Parkinson's disease: Secondary | ICD-10-CM | POA: Diagnosis not present

## 2019-05-21 DIAGNOSIS — R2681 Unsteadiness on feet: Secondary | ICD-10-CM | POA: Diagnosis not present

## 2019-05-21 DIAGNOSIS — R296 Repeated falls: Secondary | ICD-10-CM | POA: Diagnosis not present

## 2019-06-04 DIAGNOSIS — M6281 Muscle weakness (generalized): Secondary | ICD-10-CM | POA: Diagnosis not present

## 2019-06-04 DIAGNOSIS — R2681 Unsteadiness on feet: Secondary | ICD-10-CM | POA: Diagnosis not present

## 2019-06-04 DIAGNOSIS — G20 Parkinson's disease: Secondary | ICD-10-CM | POA: Diagnosis not present

## 2019-06-04 DIAGNOSIS — R296 Repeated falls: Secondary | ICD-10-CM | POA: Diagnosis not present

## 2019-07-07 NOTE — Progress Notes (Signed)
Virtual Visit via Video Note  I connected with Veronica Jordan on 07/10/19 at  1:00 PM EST by a video enabled telemedicine application and verified that I am speaking with the correct person using two identifiers.  Location: Patient: Home Provider: Clinic  This service was conducted via virtual visit.  Both audio and visual tools were used.  The patient was located at home. I was located in my office.  Consent was obtained prior to the virtual visit and is aware of possible charges through their insurance for this visit.  The patient is an established patient.  Dr. Estanislado Pandy, MD conducted the virtual visit and Hazel Sams, PA-C acted as scribe during the service.  Office staff helped with scheduling follow up visits after the service was conducted.   I discussed the limitations of evaluation and management by telemedicine and the availability of in person appointments. The patient expressed understanding and agreed to proceed.  CC: Eye dryness  History of Present Illness: Veronica Jordan is a 74 y.o. female with history of systemic lupus dermatosis, Sjogren's, osteoarthritis and degenerative disc disease.  She is taking plaquenil 200 mg 1 tablet by mouth daily. She denies any recent lupus flares. She states she has not had a flare of lupus recently. She has ongoing chronic sicca symptoms. She does not find restasis to be effective anymore.  She has intermittent pain in both knee joints.  Her right hip pain has resolved. She has chronic lower back pain, which limits her activities.  She has generalized muscle aches and muscle tenderness due to fibromyalgia.  She states since her last visit with [place] she was evaluated by Dr. Carles Collet who diagnosed her with parkinson's disease.  She was started on levodopa 1 tablet TID.  Review of Systems  Constitutional: Negative for fever and malaise/fatigue.  Eyes: Negative for photophobia, pain, discharge and redness.       +Dry eyes  Respiratory: Negative for cough,  shortness of breath and wheezing.   Cardiovascular: Negative for chest pain and palpitations.  Gastrointestinal: Negative for blood in stool, constipation and diarrhea.  Genitourinary: Negative for dysuria.  Musculoskeletal: Positive for back pain and joint pain. Negative for myalgias and neck pain.  Skin: Negative for rash.  Neurological: Negative for dizziness and headaches.  Psychiatric/Behavioral: Negative for depression. The patient is not nervous/anxious and does not have insomnia.      Observations/Objective: Physical Exam  Constitutional: She is oriented to person, place, and time and well-developed, well-nourished, and in no distress.  HENT:  Head: Normocephalic and atraumatic.  Eyes: Conjunctivae are normal.  Pulmonary/Chest: Effort normal.  Neurological: She is alert and oriented to person, place, and time.  Psychiatric: Mood, memory, affect and judgment normal.    Patient reports morning stiffness for 5  minutes.   Patient denies nocturnal pain.  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 She is clinically doing well on Plaquenil 200 mg 1 tablet by mouth daily.  She has not had any recent rashes or photosensitivity.  No symptoms of Raynaud's.  She has ongoing sicca symptoms.  She has intermittent arthralgias but no inflammation currently.  She will continue taking PLQ as prescribed.  Lab work from 03/31/19 was reviewed with the patient.  She is due to update CBC and CMP.  We will obtain autoimmune lab work at her follow up visit.  She was  advised to notify us if she develops any new or worsening symptoms.  She will follow up in 3 months.   History of optic neuritis   History of nephritis - She has been in remission and sees nephrologist once a year.   Sjogren's syndrome with keratoconjunctivitis sicca ([place]) - She has chronic sicca symptoms  She has been experiencing worsening eye dryness and does not find restasis to be effective.  She plans on following up with her ophthalmologist.  She will continue taking PLQ as prescribed.    High risk medication use - PLQ 200 mg 1 tablet by mouth daily. PLQ Eye Exam: 11/22/18.  CBC and CMP were drawn on 03/31/19.  She will be due to update CBC and CMP.  Standing orders are in place.  Primary osteoarthritis of both hands: She is not having any hand pain or joint swelling currently.   Primary osteoarthritis of both knees: She has chronic pain in both knee joints.  No joint inflammation at this time.  She has difficulty climbing steps due to the discomfort.   DDD (degenerative disc disease), lumbar: Chronic pain.  Her discomfort prevents her from doing strenuous activities.   Age-related osteoporosis without current pathological fracture: DEXA normal on 05/26/14.  She is due to update DEXA.   Fibromyalgia:  She has generalized muscle aches and muscle tenderness due to fibromyalgia.  She has chronic fatigue related to insomnia.    Parkinson's disease [facility]): She is followed by Dr. Carles Collet.  She went to physical therapy and is planning on getting a gym membership.  She is taking levodopa as prescribed.   Other medical conditions are listed as follows:   History of type 2 diabetes mellitus   Essential tremor   History of gastroesophageal reflux (GERD) -she is on Protonix.  Dyslipidemia   Anxiety and depression   Essential hypertension   History of diverticulosis   History of kidney stones   History of IBS   Eosinophilic esophagitis   Follow Up Instructions: She  will follow up in 3 months   I discussed the assessment and treatment plan with the patient. The patient was provided an opportunity to ask questions and all were answered. The patient agreed with the plan and demonstrated an understanding of the instructions.   The patient was advised to call back or seek an in-person evaluation if the symptoms worsen or if the condition fails to improve as anticipated.  I provided 15 minutes of non-face-to-face time during this encounter.  Bo Merino, MD   Scribed by-  Hazel Sams, PA-C

## 2019-07-10 ENCOUNTER — Other Ambulatory Visit: Payer: Self-pay

## 2019-07-10 ENCOUNTER — Encounter: Payer: Self-pay | Admitting: Rheumatology

## 2019-07-10 ENCOUNTER — Telehealth (INDEPENDENT_AMBULATORY_CARE_PROVIDER_SITE_OTHER): Payer: Medicare Other | Admitting: Rheumatology

## 2019-07-10 DIAGNOSIS — F329 Major depressive disorder, single episode, unspecified: Secondary | ICD-10-CM

## 2019-07-10 DIAGNOSIS — I10 Essential (primary) hypertension: Secondary | ICD-10-CM

## 2019-07-10 DIAGNOSIS — F419 Anxiety disorder, unspecified: Secondary | ICD-10-CM

## 2019-07-10 DIAGNOSIS — G20 Parkinson's disease: Secondary | ICD-10-CM | POA: Diagnosis not present

## 2019-07-10 DIAGNOSIS — Z87448 Personal history of other diseases of urinary system: Secondary | ICD-10-CM

## 2019-07-10 DIAGNOSIS — M797 Fibromyalgia: Secondary | ICD-10-CM | POA: Diagnosis not present

## 2019-07-10 DIAGNOSIS — Z8639 Personal history of other endocrine, nutritional and metabolic disease: Secondary | ICD-10-CM

## 2019-07-10 DIAGNOSIS — K200 Eosinophilic esophagitis: Secondary | ICD-10-CM

## 2019-07-10 DIAGNOSIS — Z79899 Other long term (current) drug therapy: Secondary | ICD-10-CM

## 2019-07-10 DIAGNOSIS — Z87442 Personal history of urinary calculi: Secondary | ICD-10-CM

## 2019-07-10 DIAGNOSIS — G250 Essential tremor: Secondary | ICD-10-CM

## 2019-07-10 DIAGNOSIS — Z8719 Personal history of other diseases of the digestive system: Secondary | ICD-10-CM

## 2019-07-10 DIAGNOSIS — Z8669 Personal history of other diseases of the nervous system and sense organs: Secondary | ICD-10-CM

## 2019-07-10 DIAGNOSIS — M3501 Sicca syndrome with keratoconjunctivitis: Secondary | ICD-10-CM

## 2019-07-10 DIAGNOSIS — M19041 Primary osteoarthritis, right hand: Secondary | ICD-10-CM | POA: Diagnosis not present

## 2019-07-10 DIAGNOSIS — M3219 Other organ or system involvement in systemic lupus erythematosus: Secondary | ICD-10-CM | POA: Diagnosis not present

## 2019-07-10 DIAGNOSIS — M810 Age-related osteoporosis without current pathological fracture: Secondary | ICD-10-CM

## 2019-07-10 DIAGNOSIS — E785 Hyperlipidemia, unspecified: Secondary | ICD-10-CM

## 2019-07-10 DIAGNOSIS — M170 Bilateral primary osteoarthritis of knee: Secondary | ICD-10-CM

## 2019-07-10 DIAGNOSIS — M5136 Other intervertebral disc degeneration, lumbar region: Secondary | ICD-10-CM

## 2019-07-10 DIAGNOSIS — M19042 Primary osteoarthritis, left hand: Secondary | ICD-10-CM

## 2019-07-11 DIAGNOSIS — H04123 Dry eye syndrome of bilateral lacrimal glands: Secondary | ICD-10-CM | POA: Diagnosis not present

## 2019-07-11 DIAGNOSIS — H468 Other optic neuritis: Secondary | ICD-10-CM | POA: Diagnosis not present

## 2019-07-11 DIAGNOSIS — L930 Discoid lupus erythematosus: Secondary | ICD-10-CM | POA: Diagnosis not present

## 2019-07-11 DIAGNOSIS — H538 Other visual disturbances: Secondary | ICD-10-CM | POA: Diagnosis not present

## 2019-07-14 ENCOUNTER — Other Ambulatory Visit: Payer: Self-pay | Admitting: Rheumatology

## 2019-07-14 DIAGNOSIS — M3219 Other organ or system involvement in systemic lupus erythematosus: Secondary | ICD-10-CM

## 2019-07-15 NOTE — Telephone Encounter (Signed)
Last Visit: 07/10/19 Next Visit: 09/05/19 Labs: 03/31/19 decreased GFR  PLQ Eye Exam: 11/22/18 WNL   Okay to refill per Dr. Corliss Skains

## 2019-07-24 ENCOUNTER — Other Ambulatory Visit: Payer: Self-pay | Admitting: *Deleted

## 2019-07-24 DIAGNOSIS — Z79899 Other long term (current) drug therapy: Secondary | ICD-10-CM

## 2019-07-26 LAB — CMP14+EGFR
ALT: <2 IU/L (ref 0–32)
AST: 15 IU/L (ref 0–40)
Albumin/Globulin Ratio: 1.9 (ref 1.2–2.2)
Albumin: 4.5 g/dL (ref 3.7–4.7)
Alkaline Phosphatase: 91 IU/L (ref 39–117)
BUN/Creatinine Ratio: 18 (ref 12–28)
BUN: 16 mg/dL (ref 8–27)
Bilirubin Total: 0.3 mg/dL (ref 0.0–1.2)
CO2: 28 mmol/L (ref 20–29)
Calcium: 9.4 mg/dL (ref 8.7–10.3)
Chloride: 104 mmol/L (ref 96–106)
Creatinine, Ser: 0.89 mg/dL (ref 0.57–1.00)
GFR calc Af Amer: 75 mL/min/{1.73_m2} (ref >59)
GFR calc non Af Amer: 65 mL/min/{1.73_m2} (ref >59)
Globulin, Total: 2.4 g/dL (ref 1.5–4.5)
Glucose: 98 mg/dL (ref 65–99)
Potassium: 3.9 mmol/L (ref 3.5–5.2)
Sodium: 139 mmol/L (ref 134–144)
Total Protein: 6.9 g/dL (ref 6.0–8.5)

## 2019-07-26 LAB — CBC WITH DIFFERENTIAL/PLATELET
Basophils Absolute: 0 10*3/uL (ref 0.0–0.2)
Basos: 1 %
EOS (ABSOLUTE): 0.2 10*3/uL (ref 0.0–0.4)
Eos: 3 %
Hematocrit: 33.7 % — ABNORMAL LOW (ref 34.0–46.6)
Hemoglobin: 11.1 g/dL (ref 11.1–15.9)
Lymphocytes Absolute: 1.5 10*3/uL (ref 0.7–3.1)
Lymphs: 27 %
MCH: 29 pg (ref 26.6–33.0)
MCHC: 32.9 g/dL (ref 31.5–35.7)
MCV: 88 fL (ref 79–97)
Monocytes Absolute: 0.5 10*3/uL (ref 0.1–0.9)
Monocytes: 9 %
Neutrophils Absolute: 3.2 10*3/uL (ref 1.4–7.0)
Neutrophils: 60 %
Platelets: 247 10*3/uL (ref 150–450)
RBC: 3.83 x10E6/uL (ref 3.77–5.28)
RDW: 13.4 % (ref 11.7–15.4)
WBC: 5.4 10*3/uL (ref 3.4–10.8)

## 2019-08-29 DIAGNOSIS — Z23 Encounter for immunization: Secondary | ICD-10-CM | POA: Diagnosis not present

## 2019-09-01 DIAGNOSIS — E1149 Type 2 diabetes mellitus with other diabetic neurological complication: Secondary | ICD-10-CM | POA: Diagnosis not present

## 2019-09-01 DIAGNOSIS — N1831 Chronic kidney disease, stage 3a: Secondary | ICD-10-CM | POA: Diagnosis not present

## 2019-09-01 DIAGNOSIS — M810 Age-related osteoporosis without current pathological fracture: Secondary | ICD-10-CM | POA: Diagnosis not present

## 2019-09-01 DIAGNOSIS — E1129 Type 2 diabetes mellitus with other diabetic kidney complication: Secondary | ICD-10-CM | POA: Diagnosis not present

## 2019-09-01 DIAGNOSIS — E785 Hyperlipidemia, unspecified: Secondary | ICD-10-CM | POA: Diagnosis not present

## 2019-09-01 DIAGNOSIS — G20 Parkinson's disease: Secondary | ICD-10-CM | POA: Diagnosis not present

## 2019-09-01 DIAGNOSIS — E039 Hypothyroidism, unspecified: Secondary | ICD-10-CM | POA: Diagnosis not present

## 2019-09-01 DIAGNOSIS — I129 Hypertensive chronic kidney disease with stage 1 through stage 4 chronic kidney disease, or unspecified chronic kidney disease: Secondary | ICD-10-CM | POA: Diagnosis not present

## 2019-09-04 NOTE — Progress Notes (Deleted)
Veronica Jordan was seen today in follow up for newly diagnosed Parkinsons disease.   My previous records were reviewed prior to todays visit as well as outside records available to me.  She was started on levodopa last visit.  She reports that ***.  pt denies falls.  Pt denies lightheadedness, near syncope.  No hallucinations.  Mood has been good.  Current prescribed movement disorder medications: ***Carbidopa/levodopa 25/100, 1 tablet 3 times per day   PREVIOUS MEDICATIONS: {Parkinson's RX:18200}  ALLERGIES:   Allergies  Allergen Reactions  . Clindamycin/Lincomycin     hives  . Codeine Itching  . Erythromycin   . Meperidine Hcl   . Ondansetron Hcl   . Penicillins   . Sulfonamide Derivatives   . Zofran [Ondansetron Hcl]     CURRENT MEDICATIONS:  Outpatient Encounter Medications as of 09/05/2019  Medication Sig  . hydroxychloroquine (PLAQUENIL) 200 MG tablet TAKE 1 TABLET DAILY (SYSTEMIC LUPUS ERYTHEMATOSUS)  . amLODipine (NORVASC) 10 MG tablet Take 10 mg by mouth daily.    Marland Kitchen aspirin 81 MG tablet Take 81 mg by mouth daily.    . Calcium Carbonate-Vitamin D (CALTRATE 600+D) 600-400 MG-UNIT per tablet Take 1 tablet by mouth daily.  . carbidopa-levodopa (SINEMET IR) 25-100 MG tablet Take 1 tablet by mouth 3 (three) times daily.  . Coenzyme Q10 (COQ-10) 50 MG CAPS Take 1 capsule by mouth daily.    . cycloSPORINE (RESTASIS) 0.05 % ophthalmic emulsion 1 drop daily.    Marland Kitchen DIOVAN 320 MG tablet   . escitalopram (LEXAPRO) 10 MG tablet daily.  . fluticasone (FLONASE) 50 MCG/ACT nasal spray SHAKE LQ AND U 2 SPRAYS IEN D  . levothyroxine (SYNTHROID, LEVOTHROID) 25 MCG tablet Take 25 mcg by mouth daily.    Marland Kitchen losartan (COZAAR) 100 MG tablet Take 100 mg by mouth daily.  . pantoprazole (PROTONIX) 40 MG tablet daily.  . simvastatin (ZOCOR) 40 MG tablet Take 20 mg by mouth at bedtime.    No facility-administered encounter medications on file as of 09/05/2019.    PHYSICAL EXAMINATION:     VITALS:  There were no vitals filed for this visit.  GEN:  The patient appears stated age and is in NAD. HEENT:  Normocephalic, atraumatic.  The mucous membranes are moist. The superficial temporal arteries are without ropiness or tenderness. CV:  RRR Lungs:  CTAB Neck/HEME:  There are no carotid bruits bilaterally.  Neurological examination:  Orientation: The patient is alert and oriented x3. Cranial nerves: There is good facial symmetry with*** facial hypomimia. The speech is fluent and clear. Soft palate rises symmetrically and there is no tongue deviation. Hearing is intact to conversational tone. Sensation: Sensation is intact to light touch throughout Motor: Strength is at least antigravity x4.  Movement examination: Tone: There is ***tone in the *** Abnormal movements: *** Coordination:  There is *** decremation with RAM's, *** Gait and Station: The patient has *** difficulty arising out of a deep-seated chair without the use of the hands. The patient's stride length is ***.  The patient has a *** pull test.      ASSESSMENT/PLAN:  1.  Parkinsons Disease, with strong family history of such  -***Continue carbidopa/levodopa 25/100, 1 tablet 3 times per day  2.  Depression  -Counseling has been offered.  3.  Sjogren's, lupus and fibromyalgia  -Following with Dr. Corliss Skains of rheumatology  4.  Sialorrhea  -Patient does not want Botox right now.  Total time spent on today's visit was ***30  minutes, including both face-to-face time and nonface-to-face time.  Time included that spent on review of records (prior notes available to me/labs/imaging if pertinent), discussing treatment and goals, answering patient's questions and coordinating care.  Cc:  Marton Redwood, MD

## 2019-09-05 ENCOUNTER — Ambulatory Visit: Payer: Medicare Other | Admitting: Neurology

## 2019-09-26 DIAGNOSIS — Z23 Encounter for immunization: Secondary | ICD-10-CM | POA: Diagnosis not present

## 2019-09-30 DIAGNOSIS — G20 Parkinson's disease: Secondary | ICD-10-CM | POA: Diagnosis not present

## 2019-09-30 DIAGNOSIS — N1831 Chronic kidney disease, stage 3a: Secondary | ICD-10-CM | POA: Diagnosis not present

## 2019-09-30 DIAGNOSIS — J0100 Acute maxillary sinusitis, unspecified: Secondary | ICD-10-CM | POA: Diagnosis not present

## 2019-09-30 DIAGNOSIS — R05 Cough: Secondary | ICD-10-CM | POA: Diagnosis not present

## 2019-09-30 DIAGNOSIS — H1089 Other conjunctivitis: Secondary | ICD-10-CM | POA: Diagnosis not present

## 2019-09-30 DIAGNOSIS — K219 Gastro-esophageal reflux disease without esophagitis: Secondary | ICD-10-CM | POA: Diagnosis not present

## 2019-10-02 NOTE — Progress Notes (Deleted)
Office Visit Note  Patient: Veronica Jordan             Date of Birth: December 27, 1945           MRN: [id number]             PCP: Martha Clan, MD Referring: Martha Clan, MD Visit Date: 10/09/2019 Occupation: @GUAROCC @  Subjective:  No chief complaint on file.   History of Present Illness: Veronica Jordan is a 74 y.o. female ***   Activities of Daily Living:  Patient reports morning stiffness for *** {minute/hour:19697}.   Patient {ACTIONS;DENIES/REPORTS:21021675::"Denies"} nocturnal pain.  Difficulty dressing/grooming: {ACTIONS;DENIES/REPORTS:21021675::"Denies"} Difficulty climbing stairs: {ACTIONS;DENIES/REPORTS:21021675::"Denies"} Difficulty getting out of chair: {ACTIONS;DENIES/REPORTS:21021675::"Denies"} Difficulty using hands for taps, buttons, cutlery, and/or writing: {ACTIONS;DENIES/REPORTS:21021675::"Denies"}  No Rheumatology ROS completed.   PMFS History:  Patient Active Problem List   Diagnosis Date Noted  . Sjogren's syndrome with keratoconjunctivitis sicca (HCC) 08/07/2018  . History of nephritis 08/07/2018  . History of optic neuritis 08/07/2018  . Fibromyalgia 08/07/2018  . Primary osteoarthritis of both hands 08/07/2018  . Primary osteoarthritis of both knees 08/07/2018  . DDD (degenerative disc disease), lumbar 08/07/2018  . Eosinophilic esophagitis 08/07/2018  . History of IBS 08/07/2018  . History of gastroesophageal reflux (GERD) 08/07/2018  . History of diverticulosis 08/07/2018  . History of type 2 diabetes mellitus 08/07/2018  . History of kidney stones 08/07/2018  . History of peripheral neuropathy 08/07/2018  . Dyslipidemia 08/07/2018  . Anxiety and depression 08/07/2018  . Essential tremor 08/07/2018  . Postmenopausal hormone replacement therapy 03/05/2015  . Osteoporosis 03/05/2015  . Atrophic vaginitis 03/05/2015  . HYPERTHYROIDISM 12/13/2007  . Hypercholesterolemia 12/13/2007  . Essential hypertension 12/13/2007  . ALLERGIC RHINITIS  12/13/2007  . LUPUS ERYTHEMATOSUS 12/13/2007  . DYSPNEA 12/13/2007    Past Medical History:  Diagnosis Date  . Allergic rhinitis   . Anemia   . Anxiety and depression   . Depression   . Diabetes mellitus   . Diverticulitis   . Fibromyalgia   . GERD (gastroesophageal reflux disease)   . History of gallstones   . History of kidney stones   . Hyperlipemia   . Hypertension   . Hypothyroidism   . IBS (irritable bowel syndrome)   . Osteoarthritis   . Osteoporosis   . Renal calculi   . Sjogren's syndrome (HCC)   . Systemic lupus (HCC) 2001    Family History  Problem Relation Age of Onset  . Asthma Mother   . COPD Mother   . Heart disease Father   . Asthma Sister   . Parkinsonism Other        MGM  . Hypertension Brother   . Heart attack Brother   . Arthritis Maternal Grandmother   . Heart disease Maternal Grandfather   . Lupus Paternal Grandmother   . Heart attack Paternal Grandfather   . Hypertension Sister   . Chronic fatigue Daughter   . Colon cancer Neg Hx   . Esophageal cancer Neg Hx   . Stomach cancer Neg Hx    Past Surgical History:  Procedure Laterality Date  . APPENDECTOMY  1970  . BREAST EXCISIONAL BIOPSY Left   . CATARACT EXTRACTION     bilateral  . CESAREAN SECTION  1976, 1977   x2  . CHOLECYSTECTOMY  1988  . TONSILLECTOMY AND ADENOIDECTOMY    . TOTAL ABDOMINAL HYSTERECTOMY  2000   BSO due to fibroids   Social History   Social History Narrative   Youngest  daughter died in MVA.    There is no immunization history on file for this patient.   Objective: Vital Signs: LMP 07/03/1998 (Approximate)    Physical Exam   Musculoskeletal Exam: ***  CDAI Exam: CDAI Score: -- Patient Global: --; Provider Global: -- Swollen: --; Tender: -- Joint Exam 10/09/2019   No joint exam has been documented for this visit   There is currently no information documented on the homunculus. Go to the Rheumatology activity and complete the homunculus joint  exam.  Investigation: No additional findings.  Imaging: No results found.  Recent Labs: Lab Results  Component Value Date   WBC 5.4 07/24/2019   HGB 11.1 07/24/2019   PLT 247 07/24/2019   NA 139 07/24/2019   K 3.9 07/24/2019   CL 104 07/24/2019   CO2 28 07/24/2019   GLUCOSE 98 07/24/2019   BUN 16 07/24/2019   CREATININE 0.89 07/24/2019   BILITOT 0.3 07/24/2019   ALKPHOS 91 07/24/2019   AST 15 07/24/2019   ALT <2 07/24/2019   PROT 6.9 07/24/2019   ALBUMIN 4.5 07/24/2019   CALCIUM 9.4 07/24/2019   GFRAA 75 07/24/2019    Speciality Comments: PLQ Eye Exam: 11/22/18 WNL [facility] follow up in 6 months.   Procedures:  No procedures performed Allergies: Clindamycin/lincomycin, Codeine, Erythromycin, Meperidine hcl, Ondansetron hcl, Penicillins, Sulfonamide derivatives, and Zofran [ondansetron hcl]   Assessment / Plan:     Visit Diagnoses: No diagnosis found.  Orders: No orders of the defined types were placed in this encounter.  No orders of the defined types were placed in this encounter.   Face-to-face time spent with patient was *** minutes. Greater than 50% of time was spent in counseling and coordination of care.  Follow-Up Instructions: No follow-ups on file.   Earnestine Mealing, CMA  Note - This record has been created using Editor, commissioning.  Chart creation errors have been sought, but may not always  have been located. Such creation errors do not reflect on  the standard of medical care.

## 2019-10-02 NOTE — Progress Notes (Signed)
Veronica Jordan was seen today in follow up for newly diagnosed Parkinsons disease.   My previous records were reviewed prior to todays visit as well as outside records available to me.  She was started on levodopa last visit.  She reports that she is tired all of the time and she isn't sure if that is from the medication or if it is from the fact that she is helping to raise the great grand children that live with her that are 1 and 72 y/o.  She is under a lot of stress.  She is ready to do counseling.  Having trouble with swallowing and with singing.  Asks for ST referral.  pt denies falls.  Pt denies lightheadedness, near syncope.  No hallucinations.   Current prescribed movement disorder medications: Carbidopa/levodopa 25/100, 1 tablet 3 times per day    ALLERGIES:   Allergies  Allergen Reactions  . Clindamycin/Lincomycin     hives  . Codeine Itching  . Erythromycin   . Meperidine Hcl   . Ondansetron Hcl   . Penicillins   . Sulfonamide Derivatives   . Zofran [Ondansetron Hcl]     CURRENT MEDICATIONS:  Outpatient Encounter Medications as of 10/06/2019  Medication Sig  . amLODipine (NORVASC) 10 MG tablet Take 10 mg by mouth daily.    Marland Kitchen aspirin 81 MG tablet Take 81 mg by mouth daily.    . Calcium Carbonate-Vitamin D (CALTRATE 600+D) 600-400 MG-UNIT per tablet Take 1 tablet by mouth daily.  . carbidopa-levodopa (SINEMET IR) 25-100 MG tablet Take 1 tablet by mouth 3 (three) times daily.  . Coenzyme Q10 (COQ-10) 50 MG CAPS Take 1 capsule by mouth daily.    Marland Kitchen DIOVAN 320 MG tablet   . escitalopram (LEXAPRO) 10 MG tablet daily.  . fluticasone (FLONASE) 50 MCG/ACT nasal spray SHAKE LQ AND U 2 SPRAYS IEN D  . hydroxychloroquine (PLAQUENIL) 200 MG tablet TAKE 1 TABLET DAILY (SYSTEMIC LUPUS ERYTHEMATOSUS)  . levothyroxine (SYNTHROID, LEVOTHROID) 25 MCG tablet Take 25 mcg by mouth daily.    Marland Kitchen losartan (COZAAR) 100 MG tablet Take 100 mg by mouth daily.  . pantoprazole (PROTONIX) 40 MG  tablet daily.  . simvastatin (ZOCOR) 40 MG tablet Take 20 mg by mouth at bedtime.   . [DISCONTINUED] cycloSPORINE (RESTASIS) 0.05 % ophthalmic emulsion 1 drop daily.     No facility-administered encounter medications on file as of 10/06/2019.    PHYSICAL EXAMINATION:    VITALS:   Vitals:   10/06/19 1418  BP: 122/68  Pulse: 65  SpO2: 99%  Weight: 114 lb (51.7 kg)  Height: 4\' 11"  (1.499 m)    GEN:  The patient appears stated age and is in NAD. HEENT:  Normocephalic, atraumatic.  The mucous membranes are moist. The superficial temporal arteries are without ropiness or tenderness. CV:  RRR Lungs:  CTAB Neck/HEME:  There are no carotid bruits bilaterally.  Neurological examination:  Orientation: The patient is alert and oriented x3. Cranial nerves: There is good facial symmetry with min facial hypomimia. The speech is fluent and clear. Soft palate rises symmetrically and there is no tongue deviation. Hearing is intact to conversational tone. Sensation: Sensation is intact to light touch throughout Motor: Strength is at least antigravity x4.  Movement examination: Tone: There is min increased tone in the RUE (no med since 9am and seen at 2:50pm) Abnormal movements: none Coordination:  There is no decremation with RAM's, with any form of RAMS, including alternating supination and pronation  of the forearm, hand opening and closing, finger taps, heel taps and toe taps. Gait and Station: The patient has no difficulty arising out of a deep-seated chair without the use of the hands. The patient's stride length is good.     ASSESSMENT/PLAN:  1.  Parkinsons Disease, with strong family history of such  -Continue carbidopa/levodopa 25/100, 1 tablet 3 times per day but we decided to try and change to the CR version to see if helps the fatigue  -discussed invitae testing.  Holding for now  2.  Depression  -Counseling has been offered.  She declined this previously but was agreeable today.      -think that much of fatigue is related to this  -discussed that having dr Brigitte Pulse increase lexapro to 20 mg may be of value if above doesn't help fatigue.  She will discuss with Marton Redwood, MD  3.  Sjogren's, lupus and fibromyalgia  -Following with Dr. Estanislado Pandy of rheumatology  4.  Sialorrhea  -Patient does not want Botox right now.  5.  Dysphagia  -do MBE and then ST referral  Total time spent on today's visit was 40 minutes, including both face-to-face time and nonface-to-face time.  Time included that spent on review of records (prior notes available to me/labs/imaging if pertinent), discussing treatment and goals, answering patient's questions and coordinating care.  Cc:  Marton Redwood, MD

## 2019-10-06 ENCOUNTER — Encounter: Payer: Self-pay | Admitting: Neurology

## 2019-10-06 ENCOUNTER — Other Ambulatory Visit: Payer: Self-pay

## 2019-10-06 ENCOUNTER — Ambulatory Visit (INDEPENDENT_AMBULATORY_CARE_PROVIDER_SITE_OTHER): Payer: Medicare Other | Admitting: Neurology

## 2019-10-06 VITALS — BP 122/68 | HR 65 | Ht 59.0 in | Wt 114.0 lb

## 2019-10-06 DIAGNOSIS — F330 Major depressive disorder, recurrent, mild: Secondary | ICD-10-CM

## 2019-10-06 DIAGNOSIS — K117 Disturbances of salivary secretion: Secondary | ICD-10-CM

## 2019-10-06 DIAGNOSIS — R1319 Other dysphagia: Secondary | ICD-10-CM

## 2019-10-06 DIAGNOSIS — G20 Parkinson's disease: Secondary | ICD-10-CM | POA: Diagnosis not present

## 2019-10-06 MED ORDER — CARBIDOPA-LEVODOPA ER 25-100 MG PO TBCR: 1.0000 | EXTENDED_RELEASE_TABLET | Freq: Three times a day (TID) | ORAL | 1 refills | Status: DC

## 2019-10-09 ENCOUNTER — Ambulatory Visit: Payer: Medicare Other | Admitting: Rheumatology

## 2019-10-16 ENCOUNTER — Institutional Professional Consult (permissible substitution): Payer: Medicare Other | Admitting: Clinical

## 2019-10-20 NOTE — Progress Notes (Signed)
Office Visit Note  Patient: Veronica Jordan             Date of Birth: Mar 31, 1946           MRN: [id number]             PCP: Marton Redwood, MD Referring: Marton Redwood, MD Visit Date: 10/27/2019 Occupation: @GUAROCC @  Subjective:  Medication monitoring   History of Present Illness: Veronica Jordan is a 74 y.o. female with history of systemic lupus erythematosus, fibromyalgia, and osteoporosis.  Patient is taking Plaquenil 200 mg 1 tablet by mouth daily.  She denies any signs or symptoms of a systemic lupus flare recently.  She denies any increased joint pain or joint swelling.  She denies any recent rashes or photosensitivity.  She denies any hair loss.  She has not had any symptoms of Raynaud's recently.  She denies any oral or nasal ulcerations.  She continues to have chronic sicca symptoms and uses Restasis for symptomatic relief.  She states that her level of fatigue has been stable.  She denies any enlarged lymph nodes.  She states she continues to have intermittent myalgias and muscle tenderness due to fibromyalgia.  She has been sleeping well at night. She will be updating her bone density in August 2021.   Activities of Daily Living:  Patient reports morning stiffness for 15 minutes.   Patient Reports nocturnal pain.  Difficulty dressing/grooming: Denies Difficulty climbing stairs: Reports Difficulty getting out of chair: Reports Difficulty using hands for taps, buttons, cutlery, and/or writing: Denies  Review of Systems  Constitutional: Positive for fatigue.  HENT: Positive for mouth dryness.   Eyes: Positive for dryness.  Respiratory: Positive for shortness of breath.   Cardiovascular: Positive for swelling in legs/feet.  Gastrointestinal: Positive for constipation, diarrhea and nausea.  Endocrine: Positive for cold intolerance, excessive thirst and increased urination.  Genitourinary: Negative for difficulty urinating.  Musculoskeletal: Positive for arthralgias, gait  problem, joint pain, joint swelling, muscle weakness, morning stiffness and muscle tenderness.  Skin: Negative for rash.  Allergic/Immunologic: Positive for susceptible to infections.  Neurological: Positive for numbness and weakness.  Hematological: Positive for bruising/bleeding tendency.  Psychiatric/Behavioral: Positive for sleep disturbance.    PMFS History:  Patient Active Problem List   Diagnosis Date Noted  . Sjogren's syndrome with keratoconjunctivitis sicca ([place]) 08/07/2018  . History of nephritis 08/07/2018  . History of optic neuritis 08/07/2018  . Fibromyalgia 08/07/2018  . Primary osteoarthritis of both hands 08/07/2018  . Primary osteoarthritis of both knees 08/07/2018  . DDD (degenerative disc disease), lumbar 08/07/2018  . Eosinophilic esophagitis 37/85/8850  . History of IBS 08/07/2018  . History of gastroesophageal reflux (GERD) 08/07/2018  . History of diverticulosis 08/07/2018  . History of type 2 diabetes mellitus 08/07/2018  . History of kidney stones 08/07/2018  . History of peripheral neuropathy 08/07/2018  . Dyslipidemia 08/07/2018  . Anxiety and depression 08/07/2018  . Essential tremor 08/07/2018  . Postmenopausal hormone replacement therapy 03/05/2015  . Osteoporosis 03/05/2015  . Atrophic vaginitis 03/05/2015  . HYPERTHYROIDISM 12/13/2007  . Hypercholesterolemia 12/13/2007  . Essential hypertension 12/13/2007  . ALLERGIC RHINITIS 12/13/2007  . LUPUS ERYTHEMATOSUS 12/13/2007  . DYSPNEA 12/13/2007    Past Medical History:  Diagnosis Date  . Allergic rhinitis   . Anemia   . Anxiety and depression   . Depression   . Diabetes mellitus   . Diverticulitis   . Fibromyalgia   . GERD (gastroesophageal reflux disease)   . History of  gallstones   . History of kidney stones   . Hyperlipemia   . Hypertension   . Hypothyroidism   . IBS (irritable bowel syndrome)   . Osteoarthritis   . Osteoporosis   . Renal calculi   . Sjogren's syndrome ([place])     . Systemic lupus ([place]) 2001    Family History  Problem Relation Age of Onset  . Asthma Mother   . COPD Mother   . Heart disease Father   . Asthma Sister   . Parkinsonism Other        MGM  . Hypertension Brother   . Heart attack Brother   . Arthritis Maternal Grandmother   . Heart disease Maternal Grandfather   . Lupus Paternal Grandmother   . Heart attack Paternal Grandfather   . Hypertension Sister   . Chronic fatigue Daughter   . Colon cancer Neg Hx   . Esophageal cancer Neg Hx   . Stomach cancer Neg Hx    Past Surgical History:  Procedure Laterality Date  . APPENDECTOMY  1970  . BREAST EXCISIONAL BIOPSY Left   . CATARACT EXTRACTION     bilateral  . [place]   x2  . CHOLECYSTECTOMY  1988  . TONSILLECTOMY AND ADENOIDECTOMY    . TOTAL ABDOMINAL HYSTERECTOMY  2000   BSO due to fibroids   Social History   Social History Narrative   Youngest daughter died in [place].    There is no immunization history on file for this patient.   Objective: Vital Signs: BP (!) 116/56 (BP Location: Left Arm, Patient Position: Sitting, Cuff Size: Normal)   Pulse 61   Resp 16   Ht 4' 11"  (1.499 m)   Wt 116 lb (52.6 kg)   LMP 07/03/1998 (Approximate)   BMI 23.43 kg/m    Physical Exam Vitals and nursing note reviewed.  Constitutional:      Appearance: She is well-developed.  HENT:     Head: Normocephalic and atraumatic.  Eyes:     Conjunctiva/sclera: Conjunctivae normal.  Pulmonary:     Effort: Pulmonary effort is normal.  Abdominal:     General: Bowel sounds are normal.     Palpations: Abdomen is soft.  Musculoskeletal:     Cervical back: Normal range of motion.  Lymphadenopathy:     Cervical: No cervical adenopathy.  Skin:    General: Skin is warm and dry.     Capillary Refill: Capillary refill takes less than 2 seconds.  Neurological:     Mental Status: She is alert and oriented to person, place, and time.  Psychiatric:        Behavior: Behavior  normal.      Musculoskeletal Exam: C-spine has limited range of motion with lateral rotation with some discomfort.  Thoracic and lumbar spine have good range of motion.  Shoulder joints, elbow joints, wrist joints, MCPs, PIPs and DIPs good range of motion with no synovitis.  She has PIP and DIP thickening consistent with osteoarthritis of both hands.  No tenderness or synovitis was noted.  She has complete fist formation bilaterally.  Hip joints have good range of motion with no discomfort.  Knee joints have good range of motion with no warmth or effusion.  Ankle joints have good range of motion with no tenderness or inflammation.  She has PIP and DIP thickening consistent with osteoarthritis of both feet.  No tenderness of MTP joints noted.    CDAI Exam: CDAI Score: -- Patient Global: --;  Provider Global: -- Swollen: --; Tender: -- Joint Exam 10/27/2019   No joint exam has been documented for this visit   There is currently no information documented on the homunculus. Go to the Rheumatology activity and complete the homunculus joint exam.  Investigation: No additional findings.  Imaging: No results found.  Recent Labs: Lab Results  Component Value Date   WBC 5.4 07/24/2019   HGB 11.1 07/24/2019   PLT 247 07/24/2019   NA 139 07/24/2019   K 3.9 07/24/2019   CL 104 07/24/2019   CO2 28 07/24/2019   GLUCOSE 98 07/24/2019   BUN 16 07/24/2019   CREATININE 0.89 07/24/2019   BILITOT 0.3 07/24/2019   ALKPHOS 91 07/24/2019   AST 15 07/24/2019   ALT <2 07/24/2019   PROT 6.9 07/24/2019   ALBUMIN 4.5 07/24/2019   CALCIUM 9.4 07/24/2019   GFRAA 75 07/24/2019    Speciality Comments: PLQ Eye Exam: 11/22/18 WNL [facility] follow up in 6 months.   Procedures:  No procedures performed Allergies: Clindamycin/lincomycin, Codeine, Erythromycin, Meperidine hcl, Ondansetron hcl, Penicillins, Sulfonamide derivatives, and Zofran [ondansetron hcl]        Assessment / Plan:      Visit Diagnoses: Other systemic lupus erythematosus with other organ involvement (HCC) - Positive ANA, positive Ro, positive La, positive RF, elevated ESR, history of arthritis optic neuritis and nephritis: She has not had any signs or symptoms of a systemic lupus flare recently.  She is clinically doing well on Plaquenil 200 mg 1 tablet by mouth daily.  She is tolerating Plaquenil without any side effects.  She has no synovitis on exam today.  She has not had any increased joint pain or inflammation recently.  She has not had any recent rashes.  No Malar rash was noted on exam.  We discussed the importance of wearing sunscreen SPF greater than 50 on a daily basis as well as avoiding direct sun exposure.  She has not noticed any increased hair loss recently.  She is not had any oral or nasal ulcerations.  She continues to have chronic sicca symptoms and uses Systane eyedrops as needed for symptomatic relief.  She has not noticed any enlarged lymph nodes or increased fatigue recently.  She will continue taking Plaquenil 200 mg 1 tablet by mouth daily.  She was advised to notify us if she develops signs or symptoms of a flare.  She will follow-up in the office in 5 months.  High risk medication use - PLQ 200 mg 1 tablet by mouth daily. PLQ Eye Exam: 11/22/18.  According to the patient she had an updated Plaquenil eye exam in January 2021.  CBC and CMP were reviewed today in the office from 07/24/2019.  She will be due to update lab work in June and every 5 months.  Future orders will be placed today for [facility].    History of optic neuritis  History of nephritis - She has been in remission and sees nephrologist once a year.   Sjogren's syndrome with keratoconjunctivitis sicca ([place]):  She continues to have chronic sicca symptoms.  Her symptoms have been tolerable recently.  She uses Systane eyedrops.  She will continue taking Plaquenil as prescribed.  Primary osteoarthritis of both hands: She has PIP and DIP  thickening consistent with osteoarthritis of both hands.  No tenderness or synovitis was noted.  She has complete fist formation bilaterally.  Joint protection and muscle strengthening were discussed.  Primary osteoarthritis of both knees:  She has  good range of motion of both knee joints on exam.  No warmth or effusion was noted.  She has bilateral knee crepitus but no discomfort at this time.  DDD (degenerative disc disease), lumbar: She experiences intermittent lower back pain.  She has no symptoms of radiculopathy at this time.  No midline spinal tenderness noted on exam today.  She is advised to notify us if her lower back pain persists or worsens.  Age-related osteoporosis without current pathological fracture - DEXA normal on 05/26/14.  She is due to update DEXA.  According to the patient she will be updating her bone density in August 2021.  She has not had any recent falls or fractures.  She will continue taking calcium and vitamin D supplement daily.  Fibromyalgia: She experiences intermittent myalgias and muscle tenderness due to underlying fibromyalgia.  She states overall her discomfort has been tolerable.  She continues to have chronic fatigue which has been stable.  She has been sleeping well at night.  Discussed the importance of regular exercise and good sleep hygiene.  Other medical conditions are listed as follows:   Parkinson's disease ([place]) - She is followed by Dr. Carles Collet.   History of gastroesophageal reflux (GERD)  Eosinophilic esophagitis  Essential tremor  History of type 2 diabetes mellitus  Essential hypertension  Dyslipidemia  Anxiety and depression  History of kidney stones  History of IBS  History of diverticulosis  Orders: No orders of the defined types were placed in this encounter.  No orders of the defined types were placed in this encounter.     Follow-Up Instructions: Return in about 5 months (around 03/28/2020) for Systemic lupus  erythematosus.   Ofilia Neas, PA-C  Note - This record has been created using Dragon software.  Chart creation errors have been sought, but may not always  have been located. Such creation errors do not reflect on  the standard of medical care.

## 2019-10-24 DIAGNOSIS — R1310 Dysphagia, unspecified: Secondary | ICD-10-CM | POA: Diagnosis not present

## 2019-10-27 ENCOUNTER — Other Ambulatory Visit: Payer: Self-pay

## 2019-10-27 ENCOUNTER — Ambulatory Visit (INDEPENDENT_AMBULATORY_CARE_PROVIDER_SITE_OTHER): Payer: Medicare Other | Admitting: Physician Assistant

## 2019-10-27 ENCOUNTER — Encounter: Payer: Self-pay | Admitting: Physician Assistant

## 2019-10-27 VITALS — BP 116/56 | HR 61 | Resp 16 | Ht 59.0 in | Wt 116.0 lb

## 2019-10-27 DIAGNOSIS — M5136 Other intervertebral disc degeneration, lumbar region: Secondary | ICD-10-CM | POA: Diagnosis not present

## 2019-10-27 DIAGNOSIS — M797 Fibromyalgia: Secondary | ICD-10-CM | POA: Diagnosis not present

## 2019-10-27 DIAGNOSIS — E785 Hyperlipidemia, unspecified: Secondary | ICD-10-CM

## 2019-10-27 DIAGNOSIS — K200 Eosinophilic esophagitis: Secondary | ICD-10-CM

## 2019-10-27 DIAGNOSIS — M19041 Primary osteoarthritis, right hand: Secondary | ICD-10-CM

## 2019-10-27 DIAGNOSIS — M3501 Sicca syndrome with keratoconjunctivitis: Secondary | ICD-10-CM | POA: Diagnosis not present

## 2019-10-27 DIAGNOSIS — M810 Age-related osteoporosis without current pathological fracture: Secondary | ICD-10-CM | POA: Diagnosis not present

## 2019-10-27 DIAGNOSIS — M170 Bilateral primary osteoarthritis of knee: Secondary | ICD-10-CM

## 2019-10-27 DIAGNOSIS — G20 Parkinson's disease: Secondary | ICD-10-CM | POA: Diagnosis not present

## 2019-10-27 DIAGNOSIS — Z8669 Personal history of other diseases of the nervous system and sense organs: Secondary | ICD-10-CM | POA: Diagnosis not present

## 2019-10-27 DIAGNOSIS — G250 Essential tremor: Secondary | ICD-10-CM

## 2019-10-27 DIAGNOSIS — Z8639 Personal history of other endocrine, nutritional and metabolic disease: Secondary | ICD-10-CM

## 2019-10-27 DIAGNOSIS — M3219 Other organ or system involvement in systemic lupus erythematosus: Secondary | ICD-10-CM

## 2019-10-27 DIAGNOSIS — Z79899 Other long term (current) drug therapy: Secondary | ICD-10-CM | POA: Diagnosis not present

## 2019-10-27 DIAGNOSIS — Z87448 Personal history of other diseases of urinary system: Secondary | ICD-10-CM | POA: Diagnosis not present

## 2019-10-27 DIAGNOSIS — Z87442 Personal history of urinary calculi: Secondary | ICD-10-CM

## 2019-10-27 DIAGNOSIS — I10 Essential (primary) hypertension: Secondary | ICD-10-CM

## 2019-10-27 DIAGNOSIS — Z8719 Personal history of other diseases of the digestive system: Secondary | ICD-10-CM

## 2019-10-27 DIAGNOSIS — F329 Major depressive disorder, single episode, unspecified: Secondary | ICD-10-CM

## 2019-10-27 DIAGNOSIS — F419 Anxiety disorder, unspecified: Secondary | ICD-10-CM

## 2019-10-27 DIAGNOSIS — M19042 Primary osteoarthritis, left hand: Secondary | ICD-10-CM

## 2019-10-27 NOTE — Patient Instructions (Signed)
Standing Labs We placed an order today for your standing lab work.    Please come back and get your standing labs in June  We have open lab daily Monday through Thursday from 8:30-12:30 PM and 1:30-4:30 PM and Friday from 8:30-12:30 PM and 1:30-4:00 PM at the office of Dr. Shaili Deveshwar.   You may experience shorter wait times on Monday and Friday afternoons. The office is located at [address], [address], [place], [place] [address] No appointment is necessary.   Labs are drawn by [facility].  You may receive a bill from [facility] for your lab work.  If you wish to have your labs drawn at another location, please call the office 24 hours in advance to send orders.  If you have any questions regarding directions or hours of operation,  please call [phone].   Just as a reminder please drink plenty of water prior to coming for your lab work. Thanks!  

## 2019-10-28 ENCOUNTER — Other Ambulatory Visit: Payer: Self-pay | Admitting: Rheumatology

## 2019-10-28 DIAGNOSIS — M3219 Other organ or system involvement in systemic lupus erythematosus: Secondary | ICD-10-CM

## 2019-10-28 NOTE — Telephone Encounter (Addendum)
Last Visit: 10/27/2019 Next Visit: 03/29/2020 Labs: 07/24/2019 CBC and CMP WNL Eye exam: 11/22/2018  Okay to refill per Dr. Corliss Skains.

## 2019-11-04 ENCOUNTER — Telehealth: Payer: Self-pay | Admitting: Neurology

## 2019-11-04 NOTE — Telephone Encounter (Signed)
Received a copy of patient's modified barium swallow dated 10/24/2019.  This was reported to be normal.  The speech therapy did state that given patient's and the fact that solids remained in the mid to distal esophagus for prolonged period, a GI referral was recommended for evaluation of the esophagus itself.  There was a note that the patient declined a voice evaluation/speech therapy.  Tee, please call the patient and let her know that GI referral was recommended by the clinician who did her swallow study.  From my standpoint it looked good, but further down the esophagus, they thought perhaps there was an issue that needed evaluated from a GI standpoint.  If agreeable, you can refer the patient to [facility] and send a copy of the MBE from [place]

## 2019-11-05 NOTE — Telephone Encounter (Signed)
Called patient to give results from Swallow test ands she stated now was not a good time. Patient states she had oral surgery and does not feel well. She states she will contact the office back later this week to review her results.

## 2019-11-07 NOTE — Telephone Encounter (Signed)
Spoke with patient and gave her Dr Don Perking recommendations. She voiced understanding but declined the referral. Advised patient that if she changes her mind for her to contact the office.

## 2019-12-27 DIAGNOSIS — B974 Respiratory syncytial virus as the cause of diseases classified elsewhere: Secondary | ICD-10-CM | POA: Diagnosis not present

## 2019-12-27 DIAGNOSIS — R5383 Other fatigue: Secondary | ICD-10-CM | POA: Diagnosis not present

## 2019-12-31 ENCOUNTER — Other Ambulatory Visit: Payer: Self-pay | Admitting: Infectious Diseases

## 2019-12-31 ENCOUNTER — Telehealth: Payer: Self-pay | Admitting: Infectious Diseases

## 2019-12-31 DIAGNOSIS — U071 COVID-19: Secondary | ICD-10-CM

## 2019-12-31 DIAGNOSIS — I10 Essential (primary) hypertension: Secondary | ICD-10-CM

## 2019-12-31 DIAGNOSIS — M3219 Other organ or system involvement in systemic lupus erythematosus: Secondary | ICD-10-CM

## 2019-12-31 NOTE — Telephone Encounter (Signed)
Called to discuss with patient about Covid symptoms and the use of bamlanivimab, a monoclonal antibody infusion for those with mild to moderate Covid symptoms and at a high risk of hospitalization.  Pt is qualified for this infusion at the [facility] infusion center due to Age > 65, Hypertension and immuno-compromised.    Her vaccination was given back in March with [employer].  She feels pretty rough today - she was just up in the bathroom with an episode of diarrhea. She is having some mild breathing problems for which she has a good response with an inhaler. She has also had a good response with Mucinex. Cough is described to be mild and non-productive. She has this cough at baseline due to other medical conditions and feels it is about the same. She is on day 6 of symptoms (started 6/25).   Will schedule her tomorrow July 1 at 12:30   Mychart message sent with details.    Veronica Alberts, MSN, NP-C [facility]  [email or web address] [facility] Main Line: [phone]

## 2019-12-31 NOTE — Progress Notes (Signed)
  I connected by phone with Veronica Jordan on 12/31/2019 at 12:09 PM to discuss the potential use of an new treatment for mild to moderate COVID-19 viral infection in non-hospitalized patients.  This patient is a 75 y.o. female that meets the FDA criteria for Emergency Use Authorization of bamlanivimab/etesevimab or casirivimab/imdevimab.  Has a (+) direct SARS-CoV-2 viral test result  Has mild or moderate COVID-19   Is NOT hospitalized due to COVID-19  Is within 10 days of symptom onset  Has at least one of the high risk factor(s) for progression to severe COVID-19 and/or hospitalization as defined in EUA.  Specific high risk criteria : Immunosuppressive Disease or Treatment, Older age, HTN   I have spoken and communicated the following to the patient or parent/caregiver:  1. FDA has authorized the emergency use of bamlanivimab/etesevimab and casirivimab\imdevimab for the treatment of mild to moderate COVID-19 in adults and pediatric patients with positive results of direct SARS-CoV-2 viral testing who are 50 years of age and older weighing at least 40 kg, and who are at high risk for progressing to severe COVID-19 and/or hospitalization.  2. The significant known and potential risks and benefits of bamlanivimab/etesevimab and casirivimab\imdevimab, and the extent to which such potential risks and benefits are unknown.  3. Information on available alternative treatments and the risks and benefits of those alternatives, including clinical trials.  4. Patients treated with bamlanivimab/etesevimab and casirivimab\imdevimab should continue to self-isolate and use infection control measures (e.g., wear mask, isolate, social distance, avoid sharing personal items, clean and disinfect "high touch" surfaces, and frequent handwashing) according to CDC guidelines.   5. The patient or parent/caregiver has the option to accept or refuse bamlanivimab/etesevimab or casirivimab\imdevimab .  After  reviewing this information with the patient, The patient agreed to proceed with receiving the casirivimab\imdevimab infusion and will be provided a copy of the Fact sheet prior to receiving the infusion.Veronica Jordan 12/31/2019 12:09 PM

## 2020-01-01 ENCOUNTER — Ambulatory Visit (HOSPITAL_COMMUNITY)
Admission: RE | Admit: 2020-01-01 | Discharge: 2020-01-01 | Disposition: A | Discharge status: Home / Self Care | Payer: Medicare Other | Source: Ambulatory Visit | Attending: Pulmonary Disease | Admitting: Pulmonary Disease

## 2020-01-01 ENCOUNTER — Encounter (HOSPITAL_COMMUNITY): Payer: Self-pay

## 2020-01-01 DIAGNOSIS — U071 COVID-19: Secondary | ICD-10-CM | POA: Diagnosis not present

## 2020-01-01 DIAGNOSIS — I10 Essential (primary) hypertension: Secondary | ICD-10-CM | POA: Diagnosis not present

## 2020-01-01 DIAGNOSIS — Z23 Encounter for immunization: Secondary | ICD-10-CM | POA: Insufficient documentation

## 2020-01-01 DIAGNOSIS — M3219 Other organ or system involvement in systemic lupus erythematosus: Secondary | ICD-10-CM | POA: Diagnosis not present

## 2020-01-01 MED ORDER — METHYLPREDNISOLONE SODIUM SUCC 125 MG IJ SOLR: 125.0000 mg | Freq: Once | INTRAMUSCULAR | Status: DC | PRN

## 2020-01-01 MED ORDER — DIPHENHYDRAMINE HCL 50 MG/ML IJ SOLN: 50.0000 mg | Freq: Once | INTRAMUSCULAR | Status: DC | PRN

## 2020-01-01 MED ORDER — FAMOTIDINE IN NACL 20-0.9 MG/50ML-% IV SOLN: 20.0000 mg | Freq: Once | INTRAVENOUS | Status: DC | PRN

## 2020-01-01 MED ORDER — SODIUM CHLORIDE 0.9 % IV SOLN: INTRAVENOUS | Status: DC | PRN

## 2020-01-01 MED ORDER — ALBUTEROL SULFATE HFA 108 (90 BASE) MCG/ACT IN AERS: 2.0000 | INHALATION_SPRAY | Freq: Once | RESPIRATORY_TRACT | Status: DC | PRN

## 2020-01-01 MED ORDER — EPINEPHRINE 0.3 MG/0.3ML IJ SOAJ: 0.3000 mg | Freq: Once | INTRAMUSCULAR | Status: DC | PRN

## 2020-01-01 MED ORDER — SODIUM CHLORIDE 0.9 % IV SOLN
Freq: Once | INTRAVENOUS | Status: AC
  Filled 2020-01-01: qty 600

## 2020-01-01 NOTE — Progress Notes (Addendum)
  Diagnosis: COVID-19  Physician:Dr Delford Field  Procedure: Covid Infusion Clinic Med: casirivimab\imdevimab infusion - Provided patient with casirivimab\imdevimab fact sheet for patients, parents and caregivers prior to infusion.  Complications: No immediate complications noted.  Discharge: Discharged home   Reginia Forts [place] 01/01/2020

## 2020-01-01 NOTE — Discharge Instructions (Signed)

## 2020-02-13 DIAGNOSIS — E039 Hypothyroidism, unspecified: Secondary | ICD-10-CM | POA: Diagnosis not present

## 2020-02-13 DIAGNOSIS — M810 Age-related osteoporosis without current pathological fracture: Secondary | ICD-10-CM | POA: Diagnosis not present

## 2020-02-13 DIAGNOSIS — E785 Hyperlipidemia, unspecified: Secondary | ICD-10-CM | POA: Diagnosis not present

## 2020-02-13 DIAGNOSIS — E1149 Type 2 diabetes mellitus with other diabetic neurological complication: Secondary | ICD-10-CM | POA: Diagnosis not present

## 2020-02-13 DIAGNOSIS — Z0000 Encounter for general adult medical examination without abnormal findings: Secondary | ICD-10-CM | POA: Diagnosis not present

## 2020-02-20 DIAGNOSIS — M797 Fibromyalgia: Secondary | ICD-10-CM | POA: Diagnosis not present

## 2020-02-20 DIAGNOSIS — G20 Parkinson's disease: Secondary | ICD-10-CM | POA: Diagnosis not present

## 2020-02-20 DIAGNOSIS — E039 Hypothyroidism, unspecified: Secondary | ICD-10-CM | POA: Diagnosis not present

## 2020-02-20 DIAGNOSIS — G3184 Mild cognitive impairment, so stated: Secondary | ICD-10-CM | POA: Diagnosis not present

## 2020-02-20 DIAGNOSIS — M3210 Systemic lupus erythematosus, organ or system involvement unspecified: Secondary | ICD-10-CM | POA: Diagnosis not present

## 2020-02-20 DIAGNOSIS — E1129 Type 2 diabetes mellitus with other diabetic kidney complication: Secondary | ICD-10-CM | POA: Diagnosis not present

## 2020-02-20 DIAGNOSIS — I129 Hypertensive chronic kidney disease with stage 1 through stage 4 chronic kidney disease, or unspecified chronic kidney disease: Secondary | ICD-10-CM | POA: Diagnosis not present

## 2020-02-20 DIAGNOSIS — R82998 Other abnormal findings in urine: Secondary | ICD-10-CM | POA: Diagnosis not present

## 2020-02-20 DIAGNOSIS — N1831 Chronic kidney disease, stage 3a: Secondary | ICD-10-CM | POA: Diagnosis not present

## 2020-02-20 DIAGNOSIS — Z0000 Encounter for general adult medical examination without abnormal findings: Secondary | ICD-10-CM | POA: Diagnosis not present

## 2020-02-20 DIAGNOSIS — I10 Essential (primary) hypertension: Secondary | ICD-10-CM | POA: Diagnosis not present

## 2020-02-20 DIAGNOSIS — E785 Hyperlipidemia, unspecified: Secondary | ICD-10-CM | POA: Diagnosis not present

## 2020-02-20 DIAGNOSIS — E1149 Type 2 diabetes mellitus with other diabetic neurological complication: Secondary | ICD-10-CM | POA: Diagnosis not present

## 2020-02-20 DIAGNOSIS — G629 Polyneuropathy, unspecified: Secondary | ICD-10-CM | POA: Diagnosis not present

## 2020-03-01 ENCOUNTER — Other Ambulatory Visit: Payer: Self-pay | Admitting: Neurology

## 2020-03-01 ENCOUNTER — Other Ambulatory Visit: Payer: Self-pay | Admitting: Rheumatology

## 2020-03-01 DIAGNOSIS — M3219 Other organ or system involvement in systemic lupus erythematosus: Secondary | ICD-10-CM

## 2020-03-01 DIAGNOSIS — Z79899 Other long term (current) drug therapy: Secondary | ICD-10-CM

## 2020-03-02 MED ORDER — HYDROXYCHLOROQUINE SULFATE 200 MG PO TABS: ORAL_TABLET | ORAL | 0 refills | Status: DC

## 2020-03-02 NOTE — Addendum Note (Signed)
Addended by: Henriette Combs on: 03/02/2020 01:50 PM   Modules accepted: Orders

## 2020-03-02 NOTE — Telephone Encounter (Signed)
Last Visit: 10/27/2019 Next Visit: 03/29/2020 Labs: 07/24/2019 CBC and CMP WNL Eye exam: 11/22/2018  Current Dose per office note on 10/27/2019:  Plaquenil 200 mg 1 tablet by mouth daily Dx: Other systemic lupus erythematosus with other organ involvement   Patient advised she is due to update labs and PLQ eye exam. Patient will go once she finish with quarantine.    Okay to refill PLQ?

## 2020-03-02 NOTE — Telephone Encounter (Signed)
Rx(s) sent to pharmacy electronically.  

## 2020-03-02 NOTE — Telephone Encounter (Signed)
Please advise patient that we will not be able to refill her medications any further without labs and eye examination.

## 2020-03-09 ENCOUNTER — Telehealth: Payer: Self-pay | Admitting: Rheumatology

## 2020-03-09 DIAGNOSIS — Z79899 Other long term (current) drug therapy: Secondary | ICD-10-CM

## 2020-03-09 NOTE — Telephone Encounter (Signed)
Patient called requesting labwork orders be sent to [facility] in [place].  Patient states she is going tomorrow morning 03/10/20.

## 2020-03-09 NOTE — Telephone Encounter (Signed)
Lab Orders released.  

## 2020-03-10 DIAGNOSIS — Z79899 Other long term (current) drug therapy: Secondary | ICD-10-CM | POA: Diagnosis not present

## 2020-03-11 LAB — CMP14+EGFR
ALT: 4 IU/L (ref 0–32)
AST: 11 IU/L (ref 0–40)
Albumin/Globulin Ratio: 1.6 (ref 1.2–2.2)
Albumin: 4.5 g/dL (ref 3.7–4.7)
Alkaline Phosphatase: 106 IU/L (ref 48–121)
BUN/Creatinine Ratio: 13 (ref 12–28)
BUN: 12 mg/dL (ref 8–27)
Bilirubin Total: 0.3 mg/dL (ref 0.0–1.2)
CO2: 23 mmol/L (ref 20–29)
Calcium: 9.4 mg/dL (ref 8.7–10.3)
Chloride: 102 mmol/L (ref 96–106)
Creatinine, Ser: 0.96 mg/dL (ref 0.57–1.00)
GFR calc Af Amer: 68 mL/min/{1.73_m2} (ref >59)
GFR calc non Af Amer: 59 mL/min/{1.73_m2} — ABNORMAL LOW (ref >59)
Globulin, Total: 2.9 g/dL (ref 1.5–4.5)
Glucose: 140 mg/dL — ABNORMAL HIGH (ref 65–99)
Potassium: 3.7 mmol/L (ref 3.5–5.2)
Sodium: 141 mmol/L (ref 134–144)
Total Protein: 7.4 g/dL (ref 6.0–8.5)

## 2020-03-11 LAB — CBC WITH DIFFERENTIAL/PLATELET
Basophils Absolute: 0.1 10*3/uL (ref 0.0–0.2)
Basos: 1 %
EOS (ABSOLUTE): 0.2 10*3/uL (ref 0.0–0.4)
Eos: 3 %
Hematocrit: 34.7 % (ref 34.0–46.6)
Hemoglobin: 11.3 g/dL (ref 11.1–15.9)
Immature Grans (Abs): 0 10*3/uL (ref 0.0–0.1)
Immature Granulocytes: 1 %
Lymphocytes Absolute: 1.4 10*3/uL (ref 0.7–3.1)
Lymphs: 23 %
MCH: 29 pg (ref 26.6–33.0)
MCHC: 32.6 g/dL (ref 31.5–35.7)
MCV: 89 fL (ref 79–97)
Monocytes Absolute: 0.6 10*3/uL (ref 0.1–0.9)
Monocytes: 9 %
Neutrophils Absolute: 4 10*3/uL (ref 1.4–7.0)
Neutrophils: 63 %
Platelets: 220 10*3/uL (ref 150–450)
RBC: 3.89 x10E6/uL (ref 3.77–5.28)
RDW: 13.1 % (ref 11.7–15.4)
WBC: 6.3 10*3/uL (ref 3.4–10.8)

## 2020-03-15 NOTE — Progress Notes (Deleted)
Office Visit Note  Patient: Veronica Jordan             Date of Birth: 1945-08-04           MRN: [id number]             PCP: Martha Clan, MD Referring: Martha Clan, MD Visit Date: 03/29/2020 Occupation: @GUAROCC @  Subjective:  No chief complaint on file.   History of Present Illness: Veronica Jordan is a 74 y.o. female ***   Activities of Daily Living:  Patient reports morning stiffness for *** {minute/hour:19697}.   Patient {ACTIONS;DENIES/REPORTS:21021675::"Denies"} nocturnal pain.  Difficulty dressing/grooming: {ACTIONS;DENIES/REPORTS:21021675::"Denies"} Difficulty climbing stairs: {ACTIONS;DENIES/REPORTS:21021675::"Denies"} Difficulty getting out of chair: {ACTIONS;DENIES/REPORTS:21021675::"Denies"} Difficulty using hands for taps, buttons, cutlery, and/or writing: {ACTIONS;DENIES/REPORTS:21021675::"Denies"}  No Rheumatology ROS completed.   PMFS History:  Patient Active Problem List   Diagnosis Date Noted  . Sjogren's syndrome with keratoconjunctivitis sicca (HCC) 08/07/2018  . History of nephritis 08/07/2018  . History of optic neuritis 08/07/2018  . Fibromyalgia 08/07/2018  . Primary osteoarthritis of both hands 08/07/2018  . Primary osteoarthritis of both knees 08/07/2018  . DDD (degenerative disc disease), lumbar 08/07/2018  . Eosinophilic esophagitis 08/07/2018  . History of IBS 08/07/2018  . History of gastroesophageal reflux (GERD) 08/07/2018  . History of diverticulosis 08/07/2018  . History of type 2 diabetes mellitus 08/07/2018  . History of kidney stones 08/07/2018  . History of peripheral neuropathy 08/07/2018  . Dyslipidemia 08/07/2018  . Anxiety and depression 08/07/2018  . Essential tremor 08/07/2018  . Postmenopausal hormone replacement therapy 03/05/2015  . Osteoporosis 03/05/2015  . Atrophic vaginitis 03/05/2015  . HYPERTHYROIDISM 12/13/2007  . Hypercholesterolemia 12/13/2007  . Essential hypertension 12/13/2007  . ALLERGIC RHINITIS  12/13/2007  . LUPUS ERYTHEMATOSUS 12/13/2007  . DYSPNEA 12/13/2007    Past Medical History:  Diagnosis Date  . Allergic rhinitis   . Anemia   . Anxiety and depression   . Depression   . Diabetes mellitus   . Diverticulitis   . Fibromyalgia   . GERD (gastroesophageal reflux disease)   . History of gallstones   . History of kidney stones   . Hyperlipemia   . Hypertension   . Hypothyroidism   . IBS (irritable bowel syndrome)   . Osteoarthritis   . Osteoporosis   . Renal calculi   . Sjogren's syndrome (HCC)   . Systemic lupus (HCC) 2001    Family History  Problem Relation Age of Onset  . Asthma Mother   . COPD Mother   . Heart disease Father   . Asthma Sister   . Parkinsonism Other        MGM  . Hypertension Brother   . Heart attack Brother   . Arthritis Maternal Grandmother   . Heart disease Maternal Grandfather   . Lupus Paternal Grandmother   . Heart attack Paternal Grandfather   . Hypertension Sister   . Chronic fatigue Daughter   . Colon cancer Neg Hx   . Esophageal cancer Neg Hx   . Stomach cancer Neg Hx    Past Surgical History:  Procedure Laterality Date  . APPENDECTOMY  1970  . BREAST EXCISIONAL BIOPSY Left   . CATARACT EXTRACTION     bilateral  . CESAREAN SECTION  1976, 1977   x2  . CHOLECYSTECTOMY  1988  . TONSILLECTOMY AND ADENOIDECTOMY    . TOTAL ABDOMINAL HYSTERECTOMY  2000   BSO due to fibroids   Social History   Social History Narrative   Youngest  daughter died in MVA.    There is no immunization history on file for this patient.   Objective: Vital Signs: LMP 07/03/1998 (Approximate)    Physical Exam   Musculoskeletal Exam: ***  CDAI Exam: CDAI Score: -- Patient Global: --; Provider Global: -- Swollen: --; Tender: -- Joint Exam 03/29/2020   No joint exam has been documented for this visit   There is currently no information documented on the homunculus. Go to the Rheumatology activity and complete the homunculus joint  exam.  Investigation: No additional findings.  Imaging: No results found.  Recent Labs: Lab Results  Component Value Date   WBC 6.3 03/10/2020   HGB 11.3 03/10/2020   PLT 220 03/10/2020   NA 141 03/10/2020   K 3.7 03/10/2020   CL 102 03/10/2020   CO2 23 03/10/2020   GLUCOSE 140 (H) 03/10/2020   BUN 12 03/10/2020   CREATININE 0.96 03/10/2020   BILITOT 0.3 03/10/2020   ALKPHOS 106 03/10/2020   AST 11 03/10/2020   ALT 4 03/10/2020   PROT 7.4 03/10/2020   ALBUMIN 4.5 03/10/2020   CALCIUM 9.4 03/10/2020   GFRAA 68 03/10/2020    Speciality Comments: PLQ Eye Exam: 11/22/18 WNL [facility] follow up in 6 months.   Procedures:  No procedures performed Allergies: Clindamycin/lincomycin, Codeine, Erythromycin, Meperidine hcl, Ondansetron hcl, Penicillins, Sulfonamide derivatives, and Zofran [ondansetron hcl]   Assessment / Plan:     Visit Diagnoses: No diagnosis found.  Orders: No orders of the defined types were placed in this encounter.  No orders of the defined types were placed in this encounter.   Face-to-face time spent with patient was *** minutes. Greater than 50% of time was spent in counseling and coordination of care.  Follow-Up Instructions: No follow-ups on file.   Ellen Henri, CMA  Note - This record has been created using Animal nutritionist.  Chart creation errors have been sought, but may not always  have been located. Such creation errors do not reflect on  the standard of medical care.

## 2020-03-24 DIAGNOSIS — R05 Cough: Secondary | ICD-10-CM | POA: Diagnosis not present

## 2020-03-24 DIAGNOSIS — Z20822 Contact with and (suspected) exposure to covid-19: Secondary | ICD-10-CM | POA: Diagnosis not present

## 2020-03-24 DIAGNOSIS — U071 COVID-19: Secondary | ICD-10-CM | POA: Diagnosis not present

## 2020-03-26 ENCOUNTER — Encounter: Payer: Self-pay | Admitting: Oncology

## 2020-03-26 ENCOUNTER — Other Ambulatory Visit: Payer: Self-pay | Admitting: Oncology

## 2020-03-26 ENCOUNTER — Ambulatory Visit (HOSPITAL_COMMUNITY)
Admission: RE | Admit: 2020-03-26 | Discharge: 2020-03-26 | Disposition: A | Discharge status: Home / Self Care | Payer: Medicare Other | Source: Ambulatory Visit | Attending: Pulmonary Disease | Admitting: Pulmonary Disease

## 2020-03-26 DIAGNOSIS — Z23 Encounter for immunization: Secondary | ICD-10-CM | POA: Diagnosis not present

## 2020-03-26 DIAGNOSIS — U071 COVID-19: Secondary | ICD-10-CM | POA: Diagnosis not present

## 2020-03-26 MED ORDER — SODIUM CHLORIDE 0.9 % IV SOLN: INTRAVENOUS | Status: DC | PRN

## 2020-03-26 MED ORDER — SODIUM CHLORIDE 0.9 % IV SOLN
1200.0000 mg | Freq: Once | INTRAVENOUS | Status: AC
  Administered 2020-03-26: 1200 mg via INTRAVENOUS

## 2020-03-26 MED ORDER — DIPHENHYDRAMINE HCL 50 MG/ML IJ SOLN: 50.0000 mg | Freq: Once | INTRAMUSCULAR | Status: DC | PRN

## 2020-03-26 MED ORDER — ALBUTEROL SULFATE HFA 108 (90 BASE) MCG/ACT IN AERS: 2.0000 | INHALATION_SPRAY | Freq: Once | RESPIRATORY_TRACT | Status: DC | PRN

## 2020-03-26 MED ORDER — METHYLPREDNISOLONE SODIUM SUCC 125 MG IJ SOLR: 125.0000 mg | Freq: Once | INTRAMUSCULAR | Status: DC | PRN

## 2020-03-26 MED ORDER — EPINEPHRINE 0.3 MG/0.3ML IJ SOAJ: 0.3000 mg | Freq: Once | INTRAMUSCULAR | Status: DC | PRN

## 2020-03-26 MED ORDER — FAMOTIDINE IN NACL 20-0.9 MG/50ML-% IV SOLN: 20.0000 mg | Freq: Once | INTRAVENOUS | Status: DC | PRN

## 2020-03-26 NOTE — Progress Notes (Signed)
I connected by phone with  Mr. Veronica Jordan to discuss the potential use of an new treatment for mild to moderate COVID-19 viral infection in non-hospitalized patients.   This patient is a age/sex that meets the FDA criteria for Emergency Use Authorization of casirivimab\imdevimab.  Has a (+) direct SARS-CoV-2 viral test result 1. Has mild or moderate COVID-19  2. Is ? 72 years of age and weighs ? 40 kg 3. Is NOT hospitalized due to COVID-19 4. Is NOT requiring oxygen therapy or requiring an increase in baseline oxygen flow rate due to COVID-19 5. Is within 10 days of symptom onset 6. Has at least one of the high risk factor(s) for progression to severe COVID-19 and/or hospitalization as defined in EUA. Specific high risk criteria : Past Medical History:  Diagnosis Date  . Allergic rhinitis   . Anemia   . Anxiety and depression   . Depression   . Diabetes mellitus   . Diverticulitis   . Fibromyalgia   . GERD (gastroesophageal reflux disease)   . History of gallstones   . History of kidney stones   . Hyperlipemia   . Hypertension   . Hypothyroidism   . IBS (irritable bowel syndrome)   . Osteoarthritis   . Osteoporosis   . Renal calculi   . Sjogren's syndrome (HCC)   . Systemic lupus (HCC) 2001  ?  ?    Symptom onset  03/18/20   I have spoken and communicated the following to the patient or parent/caregiver:   1. FDA has authorized the emergency use of casirivimab\imdevimab for the treatment of mild to moderate COVID-19 in adults and pediatric patients with positive results of direct SARS-CoV-2 viral testing who are 48 years of age and older weighing at least 40 kg, and who are at high risk for progressing to severe COVID-19 and/or hospitalization.   2. The significant known and potential risks and benefits of casirivimab\imdevimab, and the extent to which such potential risks and benefits are unknown.   3. Information on available alternative treatments and the risks and benefits  of those alternatives, including clinical trials.   4. Patients treated with casirivimab\imdevimab should continue to self-isolate and use infection control measures (e.g., wear mask, isolate, social distance, avoid sharing personal items, clean and disinfect "high touch" surfaces, and frequent handwashing) according to CDC guidelines.    5. The patient or parent/caregiver has the option to accept or refuse casirivimab\imdevimab .   After reviewing this information with the patient, The patient agreed to proceed with receiving casirivimab\imdevimab infusion and will be provided a copy of the Fact sheet prior to receiving the infusion.Mignon Pine, AGNP-C [phone] (Infusion Center Hotline)

## 2020-03-26 NOTE — Progress Notes (Signed)
  Diagnosis: COVID-19  Physician: Dr Wright  Procedure: Covid Infusion Clinic Med: casirivimab\imdevimab infusion - Provided patient with casirivimab\imdevimab fact sheet for patients, parents and caregivers prior to infusion.  Complications: No immediate complications noted.  Discharge: Discharged home   Veronica Jordan 03/26/2020   

## 2020-03-26 NOTE — Discharge Instructions (Signed)

## 2020-03-29 ENCOUNTER — Ambulatory Visit: Payer: Medicare Other | Admitting: Physician Assistant

## 2020-03-29 NOTE — Progress Notes (Signed)
Virtual Visit Via Video   The purpose of this virtual visit is to provide medical care while limiting exposure to the novel coronavirus.    Consent was obtained for video visit:  Yes.   Answered questions that patient had about telehealth interaction:  Yes.   I discussed the limitations, risks, security and privacy concerns of performing an evaluation and management service by telemedicine. I also discussed with the patient that there may be a patient responsible charge related to this service. The patient expressed understanding and agreed to proceed.  Pt location: Home Physician Location: office Name of referring provider:  Martha Clan, MD I connected with Veronica Jordan at patients initiation/request on 03/30/2020 at  3:00 PM EDT by video enabled telemedicine application and verified that I am speaking with the correct person using two identifiers. Pt MRN:  [id number] Pt DOB:  1945/10/10 Video Participants:  Veronica Jordan;    Assessment/Plan:   1.  Parkinson's disease, with strong family history of such  -Continue carbidopa/levodopa 25/100, 1 tablet 3 times per day  -Not interested in [place] genetic testing  2.  Depression  -Under the care of Dr. Clelia Croft.  -Counseling appointment canceled but patient states that she is feeling much better  3.  Sjogren's, lupus, fibromyalgia  -Follows with Dr. Corliss Skains  4.  Sialorrhea  -Not bothersome enough to do Botox  5.  Dysphagia  -MBE on October 24, 2019 was normal, but speech therapist noted that solids remained in the mid to distal esophagus for prolonged period and GI referral was recommended.  Patient declined.  Speech and voice therapy also declined.  Subjective   Patient seen today in follow-up for Parkinson's disease.  My previous records as well as any outside records made available were reviewed prior to todays visit.  Pt thinks one small fall since last visit - fell going into the bathroom - no serious injury.  Pt denies  lightheadedness, near syncope.  No hallucinations but occasional visural distortions.  We set her up for counseling last visit for depression, but ultimately she canceled that. States that she is feeling better mentally  MBE on October 24, 2019 was normal, but speech therapist noted that solids remained in the mid to distal esophagus for prolonged period and GI referral was recommended.  Patient declined.  Patient currently with Covid.  She is currently vaccinated. Has been receiving monoclonal antibody infusion. "I'm feeling much better."  Doing good from drooling aspect.  Current movement d/o meds:  Carbidopa/levodopa 25/100, 1 tablet 3 times per day   Current Outpatient Medications on File Prior to Visit  Medication Sig Dispense Refill  . amLODipine (NORVASC) 10 MG tablet Take 10 mg by mouth daily.      Marland Kitchen aspirin 81 MG tablet Take 81 mg by mouth daily.      . Calcium Carbonate-Vitamin D (CALTRATE 600+D) 600-400 MG-UNIT per tablet Take 1 tablet by mouth daily.    . Carbidopa-Levodopa ER (SINEMET CR) 25-100 MG tablet controlled release Take 1 tablet by mouth in the morning, at noon, and at bedtime. 270 tablet 1  . Coenzyme Q10 (COQ-10) 50 MG CAPS Take 1 capsule by mouth daily.      Marland Kitchen DIOVAN 320 MG tablet Take 320 mg by mouth daily.     Marland Kitchen escitalopram (LEXAPRO) 10 MG tablet Take 10 mg by mouth daily.     . fluticasone (FLONASE) 50 MCG/ACT nasal spray SHAKE LQ AND U 2 SPRAYS IEN D    .  hydroxychloroquine (PLAQUENIL) 200 MG tablet TAKE 1 TABLET DAILY FOR SYSTEMIC LUPUS ERYTHEMATOSUS 90 tablet 0  . levothyroxine (SYNTHROID, LEVOTHROID) 25 MCG tablet Take 25 mcg by mouth daily.      Marland Kitchen losartan (COZAAR) 100 MG tablet Take 100 mg by mouth daily.    . pantoprazole (PROTONIX) 40 MG tablet Take 40 mg by mouth daily.     . simvastatin (ZOCOR) 20 MG tablet Take 20 mg by mouth at bedtime.     . vitamin B-12 (CYANOCOBALAMIN) 1000 MCG tablet Take 1,000 mcg by mouth daily.     No current facility-administered  medications on file prior to visit.     Objective   Vitals:   03/30/20 1331  Weight: 112 lb (50.8 kg)  Height: 4\' 10"  (1.473 m)   GEN:  The patient appears stated age and is in NAD.  Neurological examination:  Orientation: The patient is alert and oriented x3. Cranial nerves: There is good facial symmetry. There is facial hypomimia.  The speech is fluent and clear. Soft palate rises symmetrically and there is no tongue deviation. Hearing is intact to conversational tone. Motor: Strength is at least antigravity x 4.   Shoulder shrug is equal and symmetric.  There is no pronator drift.  Movement examination: Tone: unable Abnormal movements: none seen but didn't see hands at rest Coordination:  There is no decremation with RAM's, in the UE (LE not seen at rest) Gait and Station: not able as on cell phone    Follow up Instructions      -I discussed the assessment and treatment plan with the patient. The patient was provided an opportunity to ask questions and all were answered. The patient agreed with the plan and demonstrated an understanding of the instructions.   The patient was advised to call back or seek an in-person evaluation if the symptoms worsen or if the condition fails to improve as anticipated.    Total time spent on today's visit was 12 minutes, including both face-to-face time and nonface-to-face time.  Time included that spent on review of records (prior notes available to me/labs/imaging if pertinent), discussing treatment and goals, answering patient's questions and coordinating care.   , DO

## 2020-03-30 ENCOUNTER — Other Ambulatory Visit: Payer: Self-pay

## 2020-03-30 ENCOUNTER — Telehealth (INDEPENDENT_AMBULATORY_CARE_PROVIDER_SITE_OTHER): Payer: Medicare Other | Admitting: Neurology

## 2020-03-30 ENCOUNTER — Encounter: Payer: Self-pay | Admitting: Neurology

## 2020-03-30 VITALS — Ht <= 58 in | Wt 112.0 lb

## 2020-03-30 DIAGNOSIS — G20 Parkinson's disease: Secondary | ICD-10-CM

## 2020-04-05 ENCOUNTER — Telehealth: Payer: Self-pay | Admitting: Rheumatology

## 2020-04-05 NOTE — Telephone Encounter (Signed)
Last office note faxed.

## 2020-04-05 NOTE — Telephone Encounter (Signed)
Please fax over last office visit note for patients exam. Fax# [phone]

## 2020-04-14 ENCOUNTER — Other Ambulatory Visit: Payer: Self-pay | Admitting: Neurology

## 2020-04-15 NOTE — Telephone Encounter (Signed)
Rx(s) sent to pharmacy electronically.  

## 2020-04-19 DIAGNOSIS — D631 Anemia in chronic kidney disease: Secondary | ICD-10-CM | POA: Diagnosis not present

## 2020-04-19 DIAGNOSIS — N181 Chronic kidney disease, stage 1: Secondary | ICD-10-CM | POA: Diagnosis not present

## 2020-04-19 DIAGNOSIS — I129 Hypertensive chronic kidney disease with stage 1 through stage 4 chronic kidney disease, or unspecified chronic kidney disease: Secondary | ICD-10-CM | POA: Diagnosis not present

## 2020-04-19 DIAGNOSIS — M329 Systemic lupus erythematosus, unspecified: Secondary | ICD-10-CM | POA: Diagnosis not present

## 2020-04-19 DIAGNOSIS — R809 Proteinuria, unspecified: Secondary | ICD-10-CM | POA: Diagnosis not present

## 2020-04-21 NOTE — Progress Notes (Deleted)
Office Visit Note  Patient: Veronica Jordan             Date of Birth: 14-Aug-1945           MRN: [id number]             PCP: Marton Redwood, MD Referring: Marton Redwood, MD Visit Date: 05/05/2020 Occupation: @GUAROCC @  Subjective:  No chief complaint on file.   History of Present Illness: Veronica Jordan is a 74 y.o. female ***   Activities of Daily Living:  Patient reports morning stiffness for *** {minute/hour:19697}.   Patient {ACTIONS;DENIES/REPORTS:21021675::"Denies"} nocturnal pain.  Difficulty dressing/grooming: {ACTIONS;DENIES/REPORTS:21021675::"Denies"} Difficulty climbing stairs: {ACTIONS;DENIES/REPORTS:21021675::"Denies"} Difficulty getting out of chair: {ACTIONS;DENIES/REPORTS:21021675::"Denies"} Difficulty using hands for taps, buttons, cutlery, and/or writing: {ACTIONS;DENIES/REPORTS:21021675::"Denies"}  No Rheumatology ROS completed.   PMFS History:  Patient Active Problem List   Diagnosis Date Noted  . 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 History of nephritis 08/07/2018  . History of optic neuritis 08/07/2018  . Fibromyalgia 08/07/2018  . Primary osteoarthritis of both hands 08/07/2018  . Primary osteoarthritis of both knees 08/07/2018  . DDD (degenerative disc disease), lumbar 08/07/2018  . Eosinophilic esophagitis 24/23/5361  . History of IBS 08/07/2018  . History of gastroesophageal reflux (GERD) 08/07/2018  . History of diverticulosis 08/07/2018  . History of type 2 diabetes mellitus 08/07/2018  . History of kidney stones 08/07/2018  . History of peripheral neuropathy 08/07/2018  . Dyslipidemia 08/07/2018  . Anxiety and depression 08/07/2018  . Essential tremor 08/07/2018  . Postmenopausal hormone replacement therapy 03/05/2015  . Osteoporosis 03/05/2015  . Atrophic vaginitis 03/05/2015  . HYPERTHYROIDISM 12/13/2007  . Hypercholesterolemia 12/13/2007  . Essential hypertension 12/13/2007  . ALLERGIC RHINITIS  12/13/2007  . LUPUS ERYTHEMATOSUS 12/13/2007  . DYSPNEA 12/13/2007    Past Medical History:  Diagnosis Date  . Allergic rhinitis   . Anemia   . Anxiety and depression   . Depression   . Diabetes mellitus   . Diverticulitis   . Fibromyalgia   . GERD (gastroesophageal reflux disease)   . History of gallstones   . History of kidney stones   . Hyperlipemia   . Hypertension   . Hypothyroidism   . IBS (irritable bowel syndrome)   . Osteoarthritis   . Osteoporosis   . Renal calculi   . Sjogren's syndrome ([place])   . Systemic lupus ([place]) 2001    Family History  Problem Relation Age of Onset  . Asthma Mother   . COPD Mother   . Heart disease Father   . Asthma Sister   . Parkinsonism Other        MGM  . Hypertension Brother   . Heart attack Brother   . Arthritis Maternal Grandmother   . Heart disease Maternal Grandfather   . Lupus Paternal Grandmother   . Heart attack Paternal Grandfather   . Hypertension Sister   . Chronic fatigue Daughter   . Colon cancer Neg Hx   . Esophageal cancer Neg Hx   . Stomach cancer Neg Hx    Past Surgical History:  Procedure Laterality Date  . APPENDECTOMY  1970  . BREAST EXCISIONAL BIOPSY Left   . CATARACT EXTRACTION     bilateral  . [place]   x2  . CHOLECYSTECTOMY  1988  . TONSILLECTOMY AND ADENOIDECTOMY    . TOTAL ABDOMINAL HYSTERECTOMY  2000   BSO due to fibroids   Social History   Social History Narrative   Youngest  daughter died in MVA.    There is no immunization history on file for this patient.   Objective: Vital Signs: LMP 07/03/1998 (Approximate)    Physical Exam   Musculoskeletal Exam: ***  CDAI Exam: CDAI Score: -- Patient Global: --; Provider Global: -- Swollen: --; Tender: -- Joint Exam 05/05/2020   No joint exam has been documented for this visit   There is currently no information documented on the homunculus. Go to the Rheumatology activity and complete the homunculus joint  exam.  Investigation: No additional findings.  Imaging: No results found.  Recent Labs: Lab Results  Component Value Date   WBC 6.3 03/10/2020   HGB 11.3 03/10/2020   PLT 220 03/10/2020   NA 141 03/10/2020   K 3.7 03/10/2020   CL 102 03/10/2020   CO2 23 03/10/2020   GLUCOSE 140 (H) 03/10/2020   BUN 12 03/10/2020   CREATININE 0.96 03/10/2020   BILITOT 0.3 03/10/2020   ALKPHOS 106 03/10/2020   AST 11 03/10/2020   ALT 4 03/10/2020   PROT 7.4 03/10/2020   ALBUMIN 4.5 03/10/2020   CALCIUM 9.4 03/10/2020   GFRAA 68 03/10/2020    Speciality Comments: PLQ Eye Exam: 11/22/18 WNL [facility] follow up in 6 months.   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) - Positive ANA, positive Ro, positive La, positive RF, elevated ESR, history of arthritis optic neuritis and nephritis  High risk medication use - PLQ 200 mg 1 tablet by mouth daily  Sjogren's syndrome with keratoconjunctivitis sicca (HCC)  Primary osteoarthritis of both hands  Primary osteoarthritis of both knees  DDD (degenerative disc disease), lumbar  Age-related osteoporosis without current pathological fracture  Fibromyalgia  History of optic neuritis  History of nephritis  Parkinson's disease (HCC)  History of gastroesophageal reflux (GERD)  Eosinophilic esophagitis  History of type 2 diabetes mellitus  Essential hypertension  Essential tremor  Dyslipidemia  Anxiety and depression  History of kidney stones  History of IBS  History of diverticulosis  Orders: No orders of the defined types were placed in this encounter.  No orders of the defined types were placed in this encounter.   Face-to-face time  spent with patient was *** minutes. Greater than 50% of time was spent in counseling and coordination of care.  Follow-Up Instructions: No follow-ups on file.   Ofilia Neas, PA-C  Note - This record has been created using Dragon software.  Chart creation errors have been sought, but may not always  have been located. Such creation errors do not reflect on  the standard of medical care.

## 2020-04-23 DIAGNOSIS — R928 Other abnormal and inconclusive findings on diagnostic imaging of breast: Secondary | ICD-10-CM | POA: Diagnosis not present

## 2020-04-23 DIAGNOSIS — Z961 Presence of intraocular lens: Secondary | ICD-10-CM | POA: Diagnosis not present

## 2020-04-23 DIAGNOSIS — Z79899 Other long term (current) drug therapy: Secondary | ICD-10-CM | POA: Diagnosis not present

## 2020-04-23 DIAGNOSIS — H47212 Primary optic atrophy, left eye: Secondary | ICD-10-CM | POA: Diagnosis not present

## 2020-04-23 DIAGNOSIS — H5213 Myopia, bilateral: Secondary | ICD-10-CM | POA: Diagnosis not present

## 2020-04-23 DIAGNOSIS — H53452 Other localized visual field defect, left eye: Secondary | ICD-10-CM | POA: Diagnosis not present

## 2020-04-23 DIAGNOSIS — H524 Presbyopia: Secondary | ICD-10-CM | POA: Diagnosis not present

## 2020-04-23 DIAGNOSIS — L931 Subacute cutaneous lupus erythematosus: Secondary | ICD-10-CM | POA: Diagnosis not present

## 2020-04-23 DIAGNOSIS — Z1231 Encounter for screening mammogram for malignant neoplasm of breast: Secondary | ICD-10-CM | POA: Diagnosis not present

## 2020-04-23 DIAGNOSIS — L930 Discoid lupus erythematosus: Secondary | ICD-10-CM | POA: Diagnosis not present

## 2020-04-23 DIAGNOSIS — H35341 Macular cyst, hole, or pseudohole, right eye: Secondary | ICD-10-CM | POA: Diagnosis not present

## 2020-05-03 NOTE — Progress Notes (Signed)
Office Visit Note  Patient: Veronica Jordan             Date of Birth: 10-08-1945           MRN: [id number]             PCP: Marton Redwood, MD Referring: Marton Redwood, MD Visit Date: 05/05/2020 Occupation: _0 @  Subjective:  Fatigue   History of Present Illness: EMMALINA Jordan is a 74 y.o. female with history of systemic lupus erythematosus, fibromyalgia, and osteoarthritis.  She is taking plaquenil 200 mg 1 tablet by mouth daily.  She reports that she continues to tolerate Plaquenil without any side effects.  She denies any symptoms of a systemic lupus flare recently.  She reports that she continues to have chronic pain and intermittent stiffness in both hands but denies any joint swelling currently.  She experiences occasional discomfort in both knee joints especially when climbing steps.  She has ongoing neck and lower back pain and stiffness.  She states that she was at the dentist for 3 hours yesterday and is having increased stiffness in her neck today.  She is not having any symptoms of radiculopathy currently.  She experiences occasional myalgias and muscle tenderness due to fibromyalgia.  She continues to have significant fatigue due to history of fibromyalgia as well as recent Covid infection in September 2021.  According to the patient she had Covid 19 in June and was diagnosed again in September.  Her young grandchildren are living with her and she has had several infections over the past few months.  She has been having difficulty sleeping and only sleeps about 4 hours per night.  She denies any recent rashes, photosensitivity, or hair loss.  She has not had any symptoms of Raynaud's.  She continues to aspirin 81 mg daily.  She denies any oral or nasal ulcerations.  She continues to have chronic sicca symptoms and uses over-the-counter products for symptomatic relief.  She continues to follow-up at [facility] but has not had any recurrence of nephritis. Patient reports that  Dr. Manuella Ghazi orders her bone densities.  She continues to take a calcium and vitamin D supplement.   Activities of Daily Living:  Patient reports morning stiffness for 10  minutes.   Patient Reports nocturnal pain.  Difficulty dressing/grooming: Denies Difficulty climbing stairs: Reports Difficulty getting out of chair: Reports Difficulty using hands for taps, buttons, cutlery, and/or writing: Denies  Review of Systems  Constitutional: Positive for fatigue.  HENT: Positive for mouth sores and mouth dryness. Negative for nose dryness.   Eyes: Positive for dryness. Negative for pain and visual disturbance.  Respiratory: Negative for cough, hemoptysis and difficulty breathing.   Cardiovascular: Positive for swelling in legs/feet. Negative for chest pain, palpitations and hypertension.  Gastrointestinal: Positive for constipation and diarrhea. Negative for blood in stool.  Genitourinary: Negative for painful urination.  Musculoskeletal: Positive for arthralgias, joint pain, muscle weakness, morning stiffness and muscle tenderness. Negative for joint swelling, myalgias and myalgias.  Skin: Negative for color change, pallor, rash, hair loss, nodules/bumps, skin tightness, ulcers and sensitivity to sunlight.  Neurological: Negative for dizziness and headaches.  Hematological: Positive for bruising/bleeding tendency. Negative for swollen glands.  Psychiatric/Behavioral: Positive for sleep disturbance. Negative for depressed mood. The patient is not nervous/anxious.     PMFS History:  Patient Active Problem List   Diagnosis Date Noted  . Sjogren's syndrome with keratoconjunctivitis sicca ([place]) 08/07/2018  . History of nephritis 08/07/2018  . History of  optic neuritis 08/07/2018  . Fibromyalgia 08/07/2018  . Primary osteoarthritis of both hands 08/07/2018  . Primary osteoarthritis of both knees 08/07/2018  . DDD (degenerative disc disease), lumbar 08/07/2018  . Eosinophilic esophagitis  96/78/9381  . History of IBS 08/07/2018  . History of gastroesophageal reflux (GERD) 08/07/2018  . History of diverticulosis 08/07/2018  . History of type 2 diabetes mellitus 08/07/2018  . History of kidney stones 08/07/2018  . History of peripheral neuropathy 08/07/2018  . Dyslipidemia 08/07/2018  . Anxiety and depression 08/07/2018  . Essential tremor 08/07/2018  . Postmenopausal hormone replacement therapy 03/05/2015  . Osteoporosis 03/05/2015  . Atrophic vaginitis 03/05/2015  . HYPERTHYROIDISM 12/13/2007  . Hypercholesterolemia 12/13/2007  . Essential hypertension 12/13/2007  . ALLERGIC RHINITIS 12/13/2007  . LUPUS ERYTHEMATOSUS 12/13/2007  . DYSPNEA 12/13/2007    Past Medical History:  Diagnosis Date  . Allergic rhinitis   . Anemia   . Anxiety and depression   . Depression   . Diabetes mellitus   . Diverticulitis   . Fibromyalgia   . GERD (gastroesophageal reflux disease)   . History of gallstones   . History of kidney stones   . Hyperlipemia   . Hypertension   . Hypothyroidism   . IBS (irritable bowel syndrome)   . Osteoarthritis   . Osteoporosis   . Renal calculi   . Sjogren's syndrome ([place])   . Systemic lupus ([place]) 2001    Family History  Problem Relation Age of Onset  . Asthma Mother   . COPD Mother   . Heart disease Father   . Asthma Sister   . Parkinsonism Other        MGM  . Hypertension Brother   . Heart attack Brother   . Arthritis Maternal Grandmother   . Heart disease Maternal Grandfather   . Lupus Paternal Grandmother   . Heart attack Paternal Grandfather   . Hypertension Sister   . Chronic fatigue Daughter   . Colon cancer Neg Hx   . Esophageal cancer Neg Hx   . Stomach cancer Neg Hx    Past Surgical History:  Procedure Laterality Date  . APPENDECTOMY  1970  . BREAST EXCISIONAL BIOPSY Left   . CATARACT EXTRACTION     bilateral  . [place]   x2  . CHOLECYSTECTOMY  1988  . TONSILLECTOMY AND ADENOIDECTOMY    .  TOTAL ABDOMINAL HYSTERECTOMY  2000   BSO due to fibroids   Social History   Social History Narrative   Youngest daughter died in [place].   Immunization History  Administered Date(s) Administered  . PFIZER SARS-COV-2 Vaccination 08/29/2019, 09/26/2019     Objective: Vital Signs: BP 122/68 (BP Location: Left Arm, Patient Position: Sitting, Cuff Size: Normal)   Pulse 67   Resp 16   Ht 4' 10.5" (1.486 m)   Wt 111 lb (50.3 kg)   LMP 07/03/1998 (Approximate)   BMI 22.80 kg/m    Physical Exam Vitals and nursing note reviewed.  Constitutional:      Appearance: She is well-developed.  HENT:     Head: Normocephalic and atraumatic.  Eyes:     Conjunctiva/sclera: Conjunctivae normal.  Cardiovascular:     Rate and Rhythm: Normal rate.  Pulmonary:     Effort: Pulmonary effort is normal.  Abdominal:     Palpations: Abdomen is soft.  Musculoskeletal:     Cervical back: Normal range of motion.  Skin:    General: Skin is warm and dry.  Capillary Refill: Capillary refill takes less than 2 seconds.  Neurological:     Mental Status: She is alert and oriented to person, place, and time.  Psychiatric:        Behavior: Behavior normal.      Musculoskeletal Exam:  C-spine limited ROM with lateral rotation.  Thoracic and lumbar spine good ROM with some discomfort in the lumbar region.  No midline spinal tenderness or SI joint tenderness.  Shoulder joints, elbow joints, wrist joints, MCPs, PIPs, and DIPs good ROM with no synovitis.  PIP and DIP thickening consistent with osteoarthritis of both hands.  Complete fist formation bilaterally.  slightl limited ROM of the right hip joint.  Left hip has good ROM with no discomfort.  Knee joints good ROM with no warmth or effusion.  Ankle joints good ROM with no tenderness or inflammation.    CDAI Exam: CDAI Score: -- Patient Global: --; Provider Global: -- Swollen: --; Tender: -- Joint Exam 05/05/2020   No joint exam has been documented for  this visit   There is currently no information documented on the homunculus. Go to the Rheumatology activity and complete the homunculus joint exam.  Investigation: No additional findings.  Imaging: No results found.  Recent Labs: Lab Results  Component Value Date   WBC 6.3 03/10/2020   HGB 11.3 03/10/2020   PLT 220 03/10/2020   NA 141 03/10/2020   K 3.7 03/10/2020   CL 102 03/10/2020   CO2 23 03/10/2020   GLUCOSE 140 (H) 03/10/2020   BUN 12 03/10/2020   CREATININE 0.96 03/10/2020   BILITOT 0.3 03/10/2020   ALKPHOS 106 03/10/2020   AST 11 03/10/2020   ALT 4 03/10/2020   PROT 7.4 03/10/2020   ALBUMIN 4.5 03/10/2020   CALCIUM 9.4 03/10/2020   GFRAA 68 03/10/2020    Speciality Comments: PLQ Eye Exam: 11/22/18 WNL [facility] follow up in 6 months.   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  She is clinically doing well on Plaquenil 200 mg 1 tablet by mouth daily.  She is tolerating Plaquenil and has not missed any doses recently.  She has not had any recent rashes, increased photosensitivity, hair loss, symptoms of Raynaud's, oral or nasal ulcerations, chest pain, palpitations, or increased shortness of breath at rest.  She continues to have chronic sicca symptoms and uses over-the-counter products for symptomatic relief.  She had no digital ulcerations or signs of gangrene on examination today.  She was encouraged to continue to take aspirin 81 mg 1 tablet by mouth daily.  Lab work from 03/31/2019 was  reviewed with the patient today in the office: Double-stranded ENA negative, complements within normal limits, and ESR was 36.  She is due to update autoimmune lab work.  Future orders placed for [facility] today.  She will continue taking Plaquenil as prescribed.  She was advised to notify us if she develops signs or symptoms of a systemic lupus flare.  She will follow-up in the office in 5 months.- Plan: CBC with Differential/Platelet, Urinalysis, Routine w reflex microscopic, Sedimentation rate, C3 and C4, Anti-DNA antibody, double-stranded,  CMP14+EGFR  High risk medication use - PLQ 200 mg 1 tablet by mouth daily. PLQ Eye Exam: 11/22/18. She is scheduled for her PLQ eye exam next week.  She was given a PLQ eye exam to take to her appointment next week.  CBC and CMP were drawn on 03/10/2020.  Future orders for CBC and CMP were placed today.   - Plan: CBC with Differential/Platelet, CMP14+EGFR She has been experiencing recurrent infections.  According to the patient she was diagnosed with COVID-19 in June 2021 and then again in September 2021.  She received the monoclonal antibody infusion after both infections.  History of optic neuritis: No recurrence.   History of nephritis - She has been in remission and sees her nephrologist at [facility] once yearly.  Sjogren's syndrome with keratoconjunctivitis sicca ([place]): She has chronic sicca symptoms which have been stable overall.  She has been using over-the-counter products for symptomatic relief.  We discussed trying XyliMelts as well as increasing her fluid intake.  We also discussed the use of a humidifier.  She was evaluated at the dentist yesterday.  She continues to see her ophthalmologist on a regular basis.  Primary osteoarthritis of both hands: She has PIP and DIP thickening consistent with osteoarthritis of both hands.  No joint tenderness or synovitis noted.  She is able to make a complete fist bilaterally.  We discussed the importance of joint  protection and muscle strengthening.  She was given a list of natural antiinflammatories to start taking, which were discussed in detail.  We also discussed the use of arthritis compression gloves.   Primary osteoarthritis of both knees: She has good ROM of both knee joints with no discomfort.  No warmth or effusion noted on exam. She experiences intermittent discomfort especially when climbing steps.   DDD (degenerative disc disease), lumbar: She has chronic pain and stiffness in her lower back.  No midline spinal tenderness noted.  She is not experiencing any symptoms of radiculopathy.   Age-related osteoporosis without current pathological fracture - DEXA ordered by PCP. DEXA normal on 05/26/14.  She is taking calcium and vitamin D supplements daily.   Fibromyalgia: She is just intermittent myalgias and muscle tenderness due to underlying fibromyalgia.  She has not had any recent flares recently.  She has been experiencing significant fatigue secondary to recurrent infections.  She was diagnosed with COVID-19 in September 2021 and still has some residual fatigue.  She has had difficulty sleeping at night and is only been sleeping about 4 hours nightly.  We discussed the importance of regular exercise and good sleep hygiene  Other medical conditions are listed as follows:   Parkinson's disease ([place]) - She is followed by Dr. Carles Collet.   History of IBS  History of diverticulosis  Dyslipidemia  History of type 2 diabetes mellitus  Essential hypertension  Anxiety and depression  History of kidney stones  Eosinophilic esophagitis  Essential tremor  History of gastroesophageal reflux (GERD)  Orders: Orders Placed This Encounter  Procedures  . CBC with Differential/Platelet  . Urinalysis, Routine w reflex microscopic  . Sedimentation rate  . C3 and C4  . Anti-DNA antibody, double-stranded  . CMP14+EGFR   No orders of the defined types were placed in this encounter.    Follow-Up  Instructions: Return in about 5 months (around 10/03/2020) for Systemic lupus erythematosus, Fibromyalgia, Osteoarthritis.   Ofilia Neas, PA-C  Note - This record has been created using Dragon software.  Chart creation errors have been sought,  but may not always  have been located. Such creation errors do not reflect on  the standard of medical care.

## 2020-05-05 ENCOUNTER — Other Ambulatory Visit: Payer: Self-pay

## 2020-05-05 ENCOUNTER — Ambulatory Visit: Payer: Medicare Other | Admitting: Physician Assistant

## 2020-05-05 ENCOUNTER — Encounter: Payer: Self-pay | Admitting: Physician Assistant

## 2020-05-05 ENCOUNTER — Ambulatory Visit (INDEPENDENT_AMBULATORY_CARE_PROVIDER_SITE_OTHER): Payer: Medicare Other | Admitting: Physician Assistant

## 2020-05-05 VITALS — BP 122/68 | HR 67 | Resp 16 | Ht 58.5 in | Wt 111.0 lb

## 2020-05-05 DIAGNOSIS — M19041 Primary osteoarthritis, right hand: Secondary | ICD-10-CM

## 2020-05-05 DIAGNOSIS — E785 Hyperlipidemia, unspecified: Secondary | ICD-10-CM

## 2020-05-05 DIAGNOSIS — M5136 Other intervertebral disc degeneration, lumbar region: Secondary | ICD-10-CM | POA: Diagnosis not present

## 2020-05-05 DIAGNOSIS — Z8719 Personal history of other diseases of the digestive system: Secondary | ICD-10-CM | POA: Diagnosis not present

## 2020-05-05 DIAGNOSIS — M3501 Sicca syndrome with keratoconjunctivitis: Secondary | ICD-10-CM

## 2020-05-05 DIAGNOSIS — Z79899 Other long term (current) drug therapy: Secondary | ICD-10-CM

## 2020-05-05 DIAGNOSIS — M3219 Other organ or system involvement in systemic lupus erythematosus: Secondary | ICD-10-CM

## 2020-05-05 DIAGNOSIS — M797 Fibromyalgia: Secondary | ICD-10-CM

## 2020-05-05 DIAGNOSIS — I10 Essential (primary) hypertension: Secondary | ICD-10-CM

## 2020-05-05 DIAGNOSIS — G250 Essential tremor: Secondary | ICD-10-CM

## 2020-05-05 DIAGNOSIS — Z87448 Personal history of other diseases of urinary system: Secondary | ICD-10-CM

## 2020-05-05 DIAGNOSIS — Z8669 Personal history of other diseases of the nervous system and sense organs: Secondary | ICD-10-CM | POA: Diagnosis not present

## 2020-05-05 DIAGNOSIS — Z87442 Personal history of urinary calculi: Secondary | ICD-10-CM

## 2020-05-05 DIAGNOSIS — F419 Anxiety disorder, unspecified: Secondary | ICD-10-CM

## 2020-05-05 DIAGNOSIS — Z8639 Personal history of other endocrine, nutritional and metabolic disease: Secondary | ICD-10-CM

## 2020-05-05 DIAGNOSIS — M170 Bilateral primary osteoarthritis of knee: Secondary | ICD-10-CM

## 2020-05-05 DIAGNOSIS — K200 Eosinophilic esophagitis: Secondary | ICD-10-CM

## 2020-05-05 DIAGNOSIS — F32A Depression, unspecified: Secondary | ICD-10-CM

## 2020-05-05 DIAGNOSIS — G20 Parkinson's disease: Secondary | ICD-10-CM

## 2020-05-05 DIAGNOSIS — M810 Age-related osteoporosis without current pathological fracture: Secondary | ICD-10-CM | POA: Diagnosis not present

## 2020-05-05 DIAGNOSIS — M19042 Primary osteoarthritis, left hand: Secondary | ICD-10-CM

## 2020-05-05 NOTE — Patient Instructions (Signed)
  COVID-19 vaccine recommendations:   COVID-19 vaccine is recommended for everyone (unless you are allergic to a vaccine component), even if you are on a medication that suppresses your immune system.   If you are on Methotrexate, Cellcept (mycophenolate), Rinvoq, Xeljanz, and Olumiant- hold the medication for 1 week after each vaccine. Hold Methotrexate for 2 weeks after the single dose COVID-19 vaccine.   If you are on Orencia subcutaneous injection - hold medication one week prior to and one week after the first COVID-19 vaccine dose (only).   If you are on Orencia IV infusions- time vaccination administration so that the first COVID-19 vaccination will occur four weeks after the infusion and postpone the subsequent infusion by one week.   If you are on Cyclophosphamide or Rituxan infusions please contact your doctor prior to receiving the COVID-19 vaccine.   Do not take Tylenol or any anti-inflammatory medications (NSAIDs) 24 hours prior to the COVID-19 vaccination.   There is no direct evidence about the efficacy of the COVID-19 vaccine in individuals who are on medications that suppress the immune system.   Even if you are fully vaccinated, and you are on any medications that suppress your immune system, please continue to wear a mask, maintain at least six feet social distance and practice hand hygiene.   If you develop a COVID-19 infection, please contact your PCP or our office to determine if you need monoclonal antibody infusion.  The booster vaccine is now available for immunocompromised patients.   Please see the following web sites for updated information.   [email or web address]    

## 2020-05-18 ENCOUNTER — Telehealth: Payer: Self-pay | Admitting: Rheumatology

## 2020-05-18 DIAGNOSIS — Z79899 Other long term (current) drug therapy: Secondary | ICD-10-CM

## 2020-05-18 DIAGNOSIS — H47012 Ischemic optic neuropathy, left eye: Secondary | ICD-10-CM | POA: Diagnosis not present

## 2020-05-18 DIAGNOSIS — M3219 Other organ or system involvement in systemic lupus erythematosus: Secondary | ICD-10-CM

## 2020-05-18 DIAGNOSIS — H53452 Other localized visual field defect, left eye: Secondary | ICD-10-CM | POA: Diagnosis not present

## 2020-05-18 NOTE — Telephone Encounter (Signed)
Patient requesting lab orders be released to [facility] in [place]. Patient going Monday 05/24/2020.

## 2020-05-20 NOTE — Telephone Encounter (Signed)
Lab Orders released.  

## 2020-06-04 ENCOUNTER — Other Ambulatory Visit: Payer: Self-pay | Admitting: Rheumatology

## 2020-06-04 DIAGNOSIS — M3219 Other organ or system involvement in systemic lupus erythematosus: Secondary | ICD-10-CM

## 2020-06-04 NOTE — Telephone Encounter (Signed)
Last Visit: 05/05/2020 Next Visit: 10/06/2020 Labs: 03/10/2020 CBC WNL. Glucose is elevated-140. GFR is borderline low-59. Creatinine remains WNL. LFTs WNL. Rest of CMP WNL.  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

## 2020-06-29 DIAGNOSIS — Z20828 Contact with and (suspected) exposure to other viral communicable diseases: Secondary | ICD-10-CM | POA: Diagnosis not present

## 2020-06-29 DIAGNOSIS — J440 Chronic obstructive pulmonary disease with acute lower respiratory infection: Secondary | ICD-10-CM | POA: Diagnosis not present

## 2020-08-06 ENCOUNTER — Other Ambulatory Visit: Payer: Self-pay | Admitting: *Deleted

## 2020-08-06 DIAGNOSIS — Z79899 Other long term (current) drug therapy: Secondary | ICD-10-CM

## 2020-08-06 DIAGNOSIS — M3219 Other organ or system involvement in systemic lupus erythematosus: Secondary | ICD-10-CM

## 2020-08-09 LAB — URINALYSIS, ROUTINE W REFLEX MICROSCOPIC
Bilirubin Urine: NEGATIVE
Glucose, UA: NEGATIVE
Hgb urine dipstick: NEGATIVE
Ketones, ur: NEGATIVE
Leukocytes,Ua: NEGATIVE
Nitrite: NEGATIVE
Protein, ur: NEGATIVE
Specific Gravity, Urine: 1.016 (ref 1.001–1.03)
pH: 5.5 (ref 5.0–8.0)

## 2020-08-09 LAB — COMPLETE METABOLIC PANEL WITH GFR
AG Ratio: 1.4 (calc) (ref 1.0–2.5)
ALT: 6 U/L (ref 6–29)
AST: 15 U/L (ref 10–35)
Albumin: 4.2 g/dL (ref 3.6–5.1)
Alkaline phosphatase (APISO): 85 U/L (ref 37–153)
BUN/Creatinine Ratio: 23 (calc) — ABNORMAL HIGH (ref 6–22)
BUN: 25 mg/dL (ref 7–25)
CO2: 26 mmol/L (ref 20–32)
Calcium: 9.5 mg/dL (ref 8.6–10.4)
Chloride: 104 mmol/L (ref 98–110)
Creat: 1.09 mg/dL — ABNORMAL HIGH (ref 0.60–0.93)
GFR, Est African American: 58 mL/min/{1.73_m2} — ABNORMAL LOW (ref >=60)
GFR, Est Non African American: 50 mL/min/{1.73_m2} — ABNORMAL LOW (ref >=60)
Globulin: 2.9 g/dL (calc) (ref 1.9–3.7)
Glucose, Bld: 121 mg/dL — ABNORMAL HIGH (ref 65–99)
Potassium: 4.1 mmol/L (ref 3.5–5.3)
Sodium: 139 mmol/L (ref 135–146)
Total Bilirubin: 0.4 mg/dL (ref 0.2–1.2)
Total Protein: 7.1 g/dL (ref 6.1–8.1)

## 2020-08-09 LAB — CBC WITH DIFFERENTIAL/PLATELET
Absolute Monocytes: 426 cells/uL (ref 200–950)
Basophils Absolute: 68 cells/uL (ref 0–200)
Basophils Relative: 1.3 %
Eosinophils Absolute: 276 cells/uL (ref 15–500)
Eosinophils Relative: 5.3 %
HCT: 33.6 % — ABNORMAL LOW (ref 35.0–45.0)
Hemoglobin: 11.1 g/dL — ABNORMAL LOW (ref 11.7–15.5)
Lymphs Abs: 1420 cells/uL (ref 850–3900)
MCH: 29 pg (ref 27.0–33.0)
MCHC: 33 g/dL (ref 32.0–36.0)
MCV: 87.7 fL (ref 80.0–100.0)
MPV: 11.2 fL (ref 7.5–12.5)
Monocytes Relative: 8.2 %
Neutro Abs: 3011 cells/uL (ref 1500–7800)
Neutrophils Relative %: 57.9 %
Platelets: 205 10*3/uL (ref 140–400)
RBC: 3.83 10*6/uL (ref 3.80–5.10)
RDW: 13.1 % (ref 11.0–15.0)
Total Lymphocyte: 27.3 %
WBC: 5.2 10*3/uL (ref 3.8–10.8)

## 2020-08-09 LAB — C3 AND C4
C3 Complement: 145 mg/dL (ref 83–193)
C4 Complement: 18 mg/dL (ref 15–57)

## 2020-08-09 LAB — SEDIMENTATION RATE: Sed Rate: 29 mm/h (ref 0–30)

## 2020-08-09 LAB — ANTI-DNA ANTIBODY, DOUBLE-STRANDED: ds DNA Ab: 3 IU/mL

## 2020-08-10 NOTE — Progress Notes (Signed)
Mild anemia noted which is a stable.  GFR is mildly decreased.  Glucose is mildly elevated.  Complements, sed rate and double-stranded DNA are within normal limits.  Please forward labs to her nephrologist and PCP.

## 2020-08-16 ENCOUNTER — Other Ambulatory Visit: Payer: Self-pay | Admitting: Internal Medicine

## 2020-08-16 DIAGNOSIS — R928 Other abnormal and inconclusive findings on diagnostic imaging of breast: Secondary | ICD-10-CM

## 2020-08-16 DIAGNOSIS — N6489 Other specified disorders of breast: Secondary | ICD-10-CM | POA: Diagnosis not present

## 2020-08-24 DIAGNOSIS — Z8616 Personal history of COVID-19: Secondary | ICD-10-CM | POA: Diagnosis not present

## 2020-08-24 DIAGNOSIS — E041 Nontoxic single thyroid nodule: Secondary | ICD-10-CM | POA: Diagnosis not present

## 2020-08-24 DIAGNOSIS — F339 Major depressive disorder, recurrent, unspecified: Secondary | ICD-10-CM | POA: Diagnosis not present

## 2020-08-24 DIAGNOSIS — I129 Hypertensive chronic kidney disease with stage 1 through stage 4 chronic kidney disease, or unspecified chronic kidney disease: Secondary | ICD-10-CM | POA: Diagnosis not present

## 2020-08-24 DIAGNOSIS — G20 Parkinson's disease: Secondary | ICD-10-CM | POA: Diagnosis not present

## 2020-08-24 DIAGNOSIS — N1831 Chronic kidney disease, stage 3a: Secondary | ICD-10-CM | POA: Diagnosis not present

## 2020-08-24 DIAGNOSIS — E039 Hypothyroidism, unspecified: Secondary | ICD-10-CM | POA: Diagnosis not present

## 2020-08-24 DIAGNOSIS — E1149 Type 2 diabetes mellitus with other diabetic neurological complication: Secondary | ICD-10-CM | POA: Diagnosis not present

## 2020-08-24 DIAGNOSIS — E1129 Type 2 diabetes mellitus with other diabetic kidney complication: Secondary | ICD-10-CM | POA: Diagnosis not present

## 2020-08-24 DIAGNOSIS — M810 Age-related osteoporosis without current pathological fracture: Secondary | ICD-10-CM | POA: Diagnosis not present

## 2020-08-24 DIAGNOSIS — M3210 Systemic lupus erythematosus, organ or system involvement unspecified: Secondary | ICD-10-CM | POA: Diagnosis not present

## 2020-08-24 DIAGNOSIS — E785 Hyperlipidemia, unspecified: Secondary | ICD-10-CM | POA: Diagnosis not present

## 2020-08-26 ENCOUNTER — Ambulatory Visit
Admission: RE | Admit: 2020-08-26 | Discharge: 2020-08-26 | Disposition: A | Discharge status: Home / Self Care | Payer: Medicare Other | Source: Ambulatory Visit | Attending: Internal Medicine | Admitting: Internal Medicine

## 2020-08-26 ENCOUNTER — Other Ambulatory Visit: Payer: Self-pay

## 2020-08-26 ENCOUNTER — Other Ambulatory Visit: Payer: Self-pay | Admitting: Internal Medicine

## 2020-08-26 DIAGNOSIS — R928 Other abnormal and inconclusive findings on diagnostic imaging of breast: Secondary | ICD-10-CM | POA: Diagnosis not present

## 2020-08-26 DIAGNOSIS — N6310 Unspecified lump in the right breast, unspecified quadrant: Secondary | ICD-10-CM

## 2020-08-26 DIAGNOSIS — N6489 Other specified disorders of breast: Secondary | ICD-10-CM | POA: Diagnosis not present

## 2020-08-31 HISTORY — PX: BREAST BIOPSY: SHX20

## 2020-09-09 ENCOUNTER — Other Ambulatory Visit: Payer: Self-pay | Admitting: Physician Assistant

## 2020-09-09 DIAGNOSIS — M3219 Other organ or system involvement in systemic lupus erythematosus: Secondary | ICD-10-CM

## 2020-09-09 NOTE — Telephone Encounter (Signed)
Last Visit: 05/05/2020 Next Visit: 10/06/2020 Labs: 08/06/2020, Mild anemia noted which is a stable. GFR is mildly decreased. Glucose is mildly elevated. Complements, sed rate and double-stranded DNA are within normal limits. Please forward labs to her nephrologist and PCP Eye exam: 04/23/2020  Current Dose per office note 05/05/2020, PLQ 200 mg 1 tablet by mouth daily [id number] systemic lupus erythematosus with other organ involvement  Last Fill: 06/04/2020  Okay to refill Plaquenil?

## 2020-09-21 ENCOUNTER — Ambulatory Visit: Payer: Medicare Other | Admitting: Neurology

## 2020-09-23 NOTE — Progress Notes (Deleted)
Office Visit Note  Patient: Veronica Jordan             Date of Birth: 1946/03/20           MRN: [id number]             PCP: Cleatis Polka., MD Referring: Cleatis Polka., MD Visit Date: 10/06/2020 Occupation: @GUAROCC @  Subjective:  No chief complaint on file.   History of Present Illness: Veronica Jordan is a 75 y.o. female ***   Activities of Daily Living:  Patient reports morning stiffness for *** {minute/hour:19697}.   Patient {ACTIONS;DENIES/REPORTS:21021675::"Denies"} nocturnal pain.  Difficulty dressing/grooming: {ACTIONS;DENIES/REPORTS:21021675::"Denies"} Difficulty climbing stairs: {ACTIONS;DENIES/REPORTS:21021675::"Denies"} Difficulty getting out of chair: {ACTIONS;DENIES/REPORTS:21021675::"Denies"} Difficulty using hands for taps, buttons, cutlery, and/or writing: {ACTIONS;DENIES/REPORTS:21021675::"Denies"}  No Rheumatology ROS completed.   PMFS History:  Patient Active Problem List   Diagnosis Date Noted  . Sjogren's syndrome with keratoconjunctivitis sicca (HCC) 08/07/2018  . History of nephritis 08/07/2018  . History of optic neuritis 08/07/2018  . Fibromyalgia 08/07/2018  . Primary osteoarthritis of both hands 08/07/2018  . Primary osteoarthritis of both knees 08/07/2018  . DDD (degenerative disc disease), lumbar 08/07/2018  . Eosinophilic esophagitis 08/07/2018  . History of IBS 08/07/2018  . History of gastroesophageal reflux (GERD) 08/07/2018  . History of diverticulosis 08/07/2018  . History of type 2 diabetes mellitus 08/07/2018  . History of kidney stones 08/07/2018  . History of peripheral neuropathy 08/07/2018  . Dyslipidemia 08/07/2018  . Anxiety and depression 08/07/2018  . Essential tremor 08/07/2018  . Postmenopausal hormone replacement therapy 03/05/2015  . Osteoporosis 03/05/2015  . Atrophic vaginitis 03/05/2015  . HYPERTHYROIDISM 12/13/2007  . Hypercholesterolemia 12/13/2007  . Essential hypertension 12/13/2007  . ALLERGIC  RHINITIS 12/13/2007  . LUPUS ERYTHEMATOSUS 12/13/2007  . DYSPNEA 12/13/2007    Past Medical History:  Diagnosis Date  . Allergic rhinitis   . Anemia   . Anxiety and depression   . Depression   . Diabetes mellitus   . Diverticulitis   . Fibromyalgia   . GERD (gastroesophageal reflux disease)   . History of gallstones   . History of kidney stones   . Hyperlipemia   . Hypertension   . Hypothyroidism   . IBS (irritable bowel syndrome)   . Osteoarthritis   . Osteoporosis   . Renal calculi   . Sjogren's syndrome (HCC)   . Systemic lupus (HCC) 2001    Family History  Problem Relation Age of Onset  . Asthma Mother   . COPD Mother   . Heart disease Father   . Asthma Sister   . Parkinsonism Other        MGM  . Hypertension Brother   . Heart attack Brother   . Arthritis Maternal Grandmother   . Heart disease Maternal Grandfather   . Lupus Paternal Grandmother   . Heart attack Paternal Grandfather   . Hypertension Sister   . Chronic fatigue Daughter   . Colon cancer Neg Hx   . Esophageal cancer Neg Hx   . Stomach cancer Neg Hx    Past Surgical History:  Procedure Laterality Date  . APPENDECTOMY  1970  . BREAST EXCISIONAL BIOPSY Left   . CATARACT EXTRACTION     bilateral  . CESAREAN SECTION  1976, 1977   x2  . CHOLECYSTECTOMY  1988  . TONSILLECTOMY AND ADENOIDECTOMY    . TOTAL ABDOMINAL HYSTERECTOMY  2000   BSO due to fibroids   Social History   Social History  Narrative   Youngest daughter died in [place].   Immunization History  Administered Date(s) Administered  . PFIZER(Purple Top)SARS-COV-2 Vaccination 08/29/2019, 09/26/2019     Objective: Vital Signs: LMP 07/03/1998 (Approximate)    Physical Exam   Musculoskeletal Exam: ***  CDAI Exam: CDAI Score: - Patient Global: -; Provider Global: - Swollen: -; Tender: - Joint Exam 10/06/2020   No joint exam has been documented for this visit   There is currently no information documented on the homunculus.  Go to the Rheumatology activity and complete the homunculus joint exam.  Investigation: No additional findings.  Imaging: MM CLIP PLACEMENT RIGHT  Result Date: 08/26/2020 CLINICAL DATA:  Status post stereotactic core biopsy right breast asymmetry/distortion. EXAM: DIAGNOSTIC right MAMMOGRAM POST stereotactic BIOPSY COMPARISON:  Prior films FINDINGS: Mammographic images were obtained following stereotactic guided biopsy of asymmetry/distortion in the central right breast. The biopsy marking clip is in expected position at the site of biopsy. IMPRESSION: Appropriate positioning of the coil shaped biopsy marking clip at the site of biopsy in the expected location of concern. Final Assessment: Post Procedure Mammograms for Marker Placement Electronically Signed   By: Sherian Rein M.D.   On: 08/26/2020 10:14   MM RT BREAST BX W LOC DEV 1ST LESION IMAGE BX SPEC STEREO GUIDE  Addendum Date: 08/27/2020   ADDENDUM REPORT: 08/27/2020 15:01 ADDENDUM: Pathology revealed COMPLEX SCLEROSING LESION of the Right breast, central. This was found to be concordant by Dr. Sherian Rein, with surgical consultation for discussion of excision recommended. Pathology results were discussed with the patient by telephone. The patient reported doing well after the biopsy with tenderness and swelling at the site. Post biopsy instructions and care were reviewed and questions were answered. The patient was encouraged to call [facility] for any additional concerns. My direct phone number was provided. Surgical consultation has been arranged with Dr. Harriette Bouillon at [facility] on September 27, 2020. Pathology results reported by Rene Kocher, RN on 08/27/2020. Electronically Signed   By: Sherian Rein M.D.   On: 08/27/2020 15:01   Result Date: 08/27/2020 CLINICAL DATA:  Right breast asymmetry/distortion for biopsy. EXAM: Right BREAST STEREOTACTIC CORE NEEDLE BIOPSY COMPARISON:  Previous exams.  FINDINGS: The patient and I discussed the procedure of stereotactic-guided biopsy including benefits and alternatives. We discussed the high likelihood of a successful procedure. We discussed the risks of the procedure including infection, bleeding, tissue injury, clip migration, and inadequate sampling. Informed written consent was given. The usual time out protocol was performed immediately prior to the procedure. Using sterile technique and 1% Lidocaine as local anesthetic, under stereotactic guidance, a 9 gauge vacuum assisted device was used to perform core needle biopsy of asymmetry/distortion central right breast using a cranial approach. Specimen radiograph was performed. Lesion quadrant: Central right breast At the conclusion of the procedure, coil tissue marker clip was deployed into the biopsy cavity. Follow-up 2-view mammogram was performed and dictated separately. IMPRESSION: Stereotactic-guided biopsy of right breast. No apparent complications. Electronically Signed: By: Sherian Rein M.D. On: 08/26/2020 10:10    Recent Labs: Lab Results  Component Value Date   WBC 5.2 08/06/2020   HGB 11.1 (L) 08/06/2020   PLT 205 08/06/2020   NA 139 08/06/2020   K 4.1 08/06/2020   CL 104 08/06/2020   CO2 26 08/06/2020   GLUCOSE 121 (H) 08/06/2020   BUN 25 08/06/2020   CREATININE 1.09 (H) 08/06/2020   BILITOT 0.4 08/06/2020   ALKPHOS 106 03/10/2020  AST 15 08/06/2020   ALT 6 08/06/2020   PROT 7.1 08/06/2020   ALBUMIN 4.5 03/10/2020   CALCIUM 9.5 08/06/2020   GFRAA 58 (L) 08/06/2020    Speciality Comments: PLQ Eye Exam: 04/23/2020 OD WNL OS has VF defect assocaited with prior ischemic neuropathy. No macular OCT atrophy. Cont to watch closely every 6 months for any new or progessive changes. @ [employer]   Procedures:  No procedures performed Allergies: Clindamycin/lincomycin, Codeine, Demerol [meperidine], Erythromycin, Meperidine hcl, Ondansetron hcl, Penicillins, Sulfonamide  derivatives, and Zofran [ondansetron hcl]   Assessment / Plan:     Visit Diagnoses: No diagnosis found.  Orders: No orders of the defined types were placed in this encounter.  No orders of the defined types were placed in this encounter.   Face-to-face time spent with patient was *** minutes. Greater than 50% of time was spent in counseling and coordination of care.  Follow-Up Instructions: No follow-ups on file.   Ellen Henri, CMA  Note - This record has been created using Animal nutritionist.  Chart creation errors have been sought, but may not always  have been located. Such creation errors do not reflect on  the standard of medical care.

## 2020-09-27 DIAGNOSIS — N6489 Other specified disorders of breast: Secondary | ICD-10-CM | POA: Diagnosis not present

## 2020-10-06 ENCOUNTER — Ambulatory Visit: Payer: Medicare Other | Admitting: Rheumatology

## 2020-10-06 DIAGNOSIS — G250 Essential tremor: Secondary | ICD-10-CM

## 2020-10-06 DIAGNOSIS — Z8669 Personal history of other diseases of the nervous system and sense organs: Secondary | ICD-10-CM

## 2020-10-06 DIAGNOSIS — M3501 Sicca syndrome with keratoconjunctivitis: Secondary | ICD-10-CM

## 2020-10-06 DIAGNOSIS — Z8719 Personal history of other diseases of the digestive system: Secondary | ICD-10-CM

## 2020-10-06 DIAGNOSIS — G20 Parkinson's disease: Secondary | ICD-10-CM

## 2020-10-06 DIAGNOSIS — K200 Eosinophilic esophagitis: Secondary | ICD-10-CM

## 2020-10-06 DIAGNOSIS — M19041 Primary osteoarthritis, right hand: Secondary | ICD-10-CM

## 2020-10-06 DIAGNOSIS — Z79899 Other long term (current) drug therapy: Secondary | ICD-10-CM

## 2020-10-06 DIAGNOSIS — M3219 Other organ or system involvement in systemic lupus erythematosus: Secondary | ICD-10-CM

## 2020-10-06 DIAGNOSIS — M170 Bilateral primary osteoarthritis of knee: Secondary | ICD-10-CM

## 2020-10-06 DIAGNOSIS — M810 Age-related osteoporosis without current pathological fracture: Secondary | ICD-10-CM

## 2020-10-06 DIAGNOSIS — M797 Fibromyalgia: Secondary | ICD-10-CM

## 2020-10-06 DIAGNOSIS — Z87448 Personal history of other diseases of urinary system: Secondary | ICD-10-CM

## 2020-10-06 DIAGNOSIS — E785 Hyperlipidemia, unspecified: Secondary | ICD-10-CM

## 2020-10-06 DIAGNOSIS — M5136 Other intervertebral disc degeneration, lumbar region: Secondary | ICD-10-CM

## 2020-10-06 DIAGNOSIS — F419 Anxiety disorder, unspecified: Secondary | ICD-10-CM

## 2020-10-06 DIAGNOSIS — I10 Essential (primary) hypertension: Secondary | ICD-10-CM

## 2020-10-06 DIAGNOSIS — Z87442 Personal history of urinary calculi: Secondary | ICD-10-CM

## 2020-10-06 DIAGNOSIS — Z8639 Personal history of other endocrine, nutritional and metabolic disease: Secondary | ICD-10-CM

## 2020-10-07 NOTE — Progress Notes (Signed)
Office Visit Note  Patient: Veronica Jordan             Date of Birth: 1945-11-29           MRN: [id number]             PCP: Ginger Organ., MD Referring: Ginger Organ., MD Visit Date: 10/08/2020 Occupation: @GUAROCC @  Subjective:  Sicca symptoms   History of Present Illness: Veronica Jordan is a 75 y.o. female with history of systemic lupus erythematosus, Sjogren's syndrome, fibromyalgia, and osteoarthritis.  Patient is currently taking Plaquenil 200 mg 1 tablet by mouth daily.  Veronica Jordan is tolerating Plaquenil without any side effects.  Veronica Jordan denies any signs or symptoms of a systemic lupus flare recently.  Veronica Jordan continues to see her nephrologist on a yearly basis and has not had any signs of active nephritis recently.  Veronica Jordan denies any recent rashes and tries to avoid direct sun exposure.  Veronica Jordan denies any increased hair loss.  Veronica Jordan has not had any oral or nasal ulcerations.  Veronica Jordan has not had any symptoms of raynaud's.  Veronica Jordan continues to take norvasc 10 mg daily and aspirin 81 mg daily. Veronica Jordan continues to have chronic sicca symptoms.  Veronica Jordan uses eyedrops twice daily for dry eyes.  Veronica Jordan has not been using any over-the-counter products for mouth dryness.  Veronica Jordan denies any swollen lymph nodes.  Veronica Jordan continues to see her dentist and ophthalmologist on a regular basis as recommended.  Veronica Jordan denies any shortness of breath, palpitations, pleuritic chest pain.  Veronica Jordan continues to experience intermittent myalgias and muscle tenderness due to underlying fibromyalgia.  Veronica Jordan continues to have intermittent fatigue and has been sleeping well at night (6-8 hours per night).  Veronica Jordan has chronic neck and lower back discomfort and stiffness.  No symptoms of radiculopathy.  Veronica Jordan denies any recent falls.  Veronica Jordan takes calcium and vitamin D daily.    Activities of Daily Living:  Patient reports morning stiffness for 10-15 minutes.   Patient Denies nocturnal pain.  Difficulty dressing/grooming: Denies Difficulty climbing stairs:  Denies Difficulty getting out of chair: Denies Difficulty using hands for taps, buttons, cutlery, and/or writing: Denies  Review of Systems  Constitutional: Positive for fatigue.  HENT: Positive for mouth dryness and nose dryness. Negative for mouth sores.   Eyes: Positive for dryness. Negative for pain and itching.  Respiratory: Negative for shortness of breath and difficulty breathing.   Cardiovascular: Negative for chest pain and palpitations.  Gastrointestinal: Negative for blood in stool, constipation and diarrhea.  Endocrine: Negative for increased urination.  Genitourinary: Negative for difficulty urinating.  Musculoskeletal: Positive for arthralgias, joint pain, myalgias, morning stiffness, muscle tenderness and myalgias. Negative for joint swelling.  Skin: Negative for color change, rash and redness.  Allergic/Immunologic: Positive for susceptible to infections.  Neurological: Positive for weakness. Negative for dizziness, numbness, headaches and memory loss.  Hematological: Positive for bruising/bleeding tendency.  Psychiatric/Behavioral: Negative for confusion and sleep disturbance.    PMFS History:  Patient Active Problem List   Diagnosis Date Noted  . Sjogren's syndrome with keratoconjunctivitis sicca ([place]) 08/07/2018  . History of nephritis 08/07/2018  . History of optic neuritis 08/07/2018  . Fibromyalgia 08/07/2018  . Primary osteoarthritis of both hands 08/07/2018  . Primary osteoarthritis of both knees 08/07/2018  . DDD (degenerative disc disease), lumbar 08/07/2018  . Eosinophilic esophagitis 24/58/0998  . History of IBS 08/07/2018  . History of gastroesophageal reflux (GERD) 08/07/2018  . History of diverticulosis 08/07/2018  .  History of type 2 diabetes mellitus 08/07/2018  . History of kidney stones 08/07/2018  . History of peripheral neuropathy 08/07/2018  . Dyslipidemia 08/07/2018  . Anxiety and depression 08/07/2018  . Essential tremor 08/07/2018  .  Postmenopausal hormone replacement therapy 03/05/2015  . Osteoporosis 03/05/2015  . Atrophic vaginitis 03/05/2015  . HYPERTHYROIDISM 12/13/2007  . Hypercholesterolemia 12/13/2007  . Essential hypertension 12/13/2007  . ALLERGIC RHINITIS 12/13/2007  . LUPUS ERYTHEMATOSUS 12/13/2007  . DYSPNEA 12/13/2007    Past Medical History:  Diagnosis Date  . Allergic rhinitis   . Anemia   . Anxiety and depression   . Depression   . Diabetes mellitus   . Diverticulitis   . Fibromyalgia   . GERD (gastroesophageal reflux disease)   . History of gallstones   . History of kidney stones   . Hyperlipemia   . Hypertension   . Hypothyroidism   . IBS (irritable bowel syndrome)   . Osteoarthritis   . Osteoporosis   . Renal calculi   . Sjogren's syndrome ([place])   . Systemic lupus ([place]) 2001    Family History  Problem Relation Age of Onset  . Asthma Mother   . COPD Mother   . Heart disease Father   . Asthma Sister   . Parkinsonism Other        MGM  . Hypertension Brother   . Heart attack Brother   . Arthritis Maternal Grandmother   . Heart disease Maternal Grandfather   . Lupus Paternal Grandmother   . Heart attack Paternal Grandfather   . Hypertension Sister   . Chronic fatigue Daughter   . Colon cancer Neg Hx   . Esophageal cancer Neg Hx   . Stomach cancer Neg Hx    Past Surgical History:  Procedure Laterality Date  . APPENDECTOMY  1970  . BREAST BIOPSY Left 08/2020  . BREAST EXCISIONAL BIOPSY Left   . CATARACT EXTRACTION     bilateral  . [place]   x2  . CHOLECYSTECTOMY  1988  . TONSILLECTOMY AND ADENOIDECTOMY    . TOTAL ABDOMINAL HYSTERECTOMY  2000   BSO due to fibroids   Social History   Social History Narrative   Youngest daughter died in [place].   Immunization History  Administered Date(s) Administered  . PFIZER(Purple Top)SARS-COV-2 Vaccination 08/29/2019, 09/26/2019     Objective: Vital Signs: BP 114/67 (BP Location: Left Arm, Patient  Position: Sitting, Cuff Size: Normal)   Pulse 68   Resp 16   Ht 4' 10.5" (1.486 m)   Wt 122 lb 12.8 oz (55.7 kg)   LMP 07/03/1998 (Approximate)   BMI 25.23 kg/m    Physical Exam Vitals and nursing note reviewed.  Constitutional:      Appearance: Veronica Jordan is well-developed.  HENT:     Head: Normocephalic and atraumatic.  Eyes:     Conjunctiva/sclera: Conjunctivae normal.  Cardiovascular:     Rate and Rhythm: Normal rate and regular rhythm.  Pulmonary:     Effort: Pulmonary effort is normal.     Breath sounds: Wheezing (Expiratory wheezing base of left lung) present.  Abdominal:     Palpations: Abdomen is soft.  Musculoskeletal:     Cervical back: Normal range of motion.  Lymphadenopathy:     Cervical: No cervical adenopathy.  Skin:    General: Skin is warm and dry.     Capillary Refill: Capillary refill takes less than 2 seconds.     Comments: No malar rash.  No digital  ulcerations or signs of gangrene.   Neurological:     Mental Status: Veronica Jordan is alert and oriented to person, place, and time.  Psychiatric:        Behavior: Behavior normal.      Musculoskeletal Exam: C-spine limited ROM with lateral rotation.  Trapezius muscle tension and muscle tenderness bilaterally.   Tenderness over the left SI joint.  No midline spinal tenderness.  Shoulder joints, elbow joints, wrist joints, MCPs, PIPs, DIPs have good range of motion with no synovitis.  PIP and DIP thickening consistent with osteoarthritis of both hands noted.  Complete fist formation bilaterally.  Hip joints have slightly limited range of motion.  Knee joints have good range of motion with no warmth or effusion.  Ankle joints have good range of motion with no tenderness or inflammation.  CDAI Exam: CDAI Score: -- Patient Global: --; Provider Global: -- Swollen: --; Tender: -- Joint Exam 10/08/2020   No joint exam has been documented for this visit   There is currently no information documented on the homunculus. Go to  the Rheumatology activity and complete the homunculus joint exam.  Investigation: No additional findings.  Imaging: No results found.  Recent Labs: Lab Results  Component Value Date   WBC 5.2 08/06/2020   HGB 11.1 (L) 08/06/2020   PLT 205 08/06/2020   NA 139 08/06/2020   K 4.1 08/06/2020   CL 104 08/06/2020   CO2 26 08/06/2020   GLUCOSE 121 (H) 08/06/2020   BUN 25 08/06/2020   CREATININE 1.09 (H) 08/06/2020   BILITOT 0.4 08/06/2020   ALKPHOS 106 03/10/2020   AST 15 08/06/2020   ALT 6 08/06/2020   PROT 7.1 08/06/2020   ALBUMIN 4.5 03/10/2020   CALCIUM 9.5 08/06/2020   GFRAA 58 (L) 08/06/2020    Speciality Comments: PLQ Eye Exam: 04/23/2020 OD WNL OS has VF defect assocaited with prior ischemic neuropathy. No macular OCT atrophy. Cont to watch closely every 6 months for any new or progessive changes. @ [employer]   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) - Positive ANA, positive Ro, positive La, positive RF, elevated ESR, history of arthritis optic neuritis and nephritis: Veronica Jordan has not had any signs or symptoms of a systemic lupus flare recently.  Veronica Jordan is clinically doing well taking Plaquenil 200 mg 1 tablet by mouth daily.  Veronica Jordan is tolerating Plaquenil without any side effects.  Veronica Jordan experiences intermittent arthralgias and myalgias.  Veronica Jordan has no synovitis on exam.  Veronica Jordan has not had any symptoms of Raynaud's recently.  No digital ulcerations or signs of sclerodactyly were noted.  Veronica Jordan continues to take amlodipine 10 mg by mouth daily and aspirin 81 mg 1 tablet by mouth daily.  Veronica Jordan has not had any oral or nasal ulcerations.  Veronica Jordan continues to have chronic sicca symptoms which have been tolerable  overall.  Veronica Jordan has not had any recent rashes and has no signs of alopecia.  Discussed the importance of avoiding UV exposure and wearing sunscreen SPF greater than 50 on a daily basis.  Veronica Jordan has not had any shortness of breath, palpitations, or pleuritic chest pain.  Her level of fatigue has been stable.  Lab work from 08/06/2020 was reviewed with the patient today in the office: Double-stranded DNA negative, complements within moments,  ESR within normal limits, UA normal.  Her next lab work will be due in July.  Future orders were placed today.  Veronica Jordan continues to see her nephrologist on a yearly basis despite her nephritis being in remission.  Veronica Jordan will continue on Plaquenil as prescribed.  Veronica Jordan does not need any refills at this time.  Veronica Jordan was advised to notify us if Veronica Jordan develops signs or symptoms of a flare.  Veronica Jordan will follow up in 5 months. - Plan: Anti-DNA antibody, double-stranded, Sedimentation rate, C3 and C4, CBC with Differential/Platelet, CMP14+EGFR, Urinalysis, Routine w reflex microscopic, Rheumatoid factor, Serum protein electrophoresis with reflex  High risk medication use - Plaquenil 200 mg 1 tablet by mouth daily. CBC and CMP updated on 08/06/20.  Veronica Jordan will be due to update lab work in July and every 5 months. PLQ Eye Exam: 04/23/2020 OD WNL OS has VF defect assocaited with prior ischemic neuropathy. No macular OCT atrophy. Cont to watch closely every 6 months for any new or progessive changes. @ [employer].   - Plan: CBC with Differential/Platelet, CMP14+EGFR Veronica Jordan has not had any recent infections.  Veronica Jordan has had 2 pfizer covid-19 vaccine doses.  Veronica Jordan does not plan on receiving the booster.   History of optic neuritis: Veronica Jordan sees her ophthalmologist on a regular basis.    History of nephritis - Veronica Jordan has been in remission and sees her nephrologist at [facility] once yearly.  CMP with GFR and UA ordered today.  - Plan: CMP14+EGFR, Urinalysis, Routine w reflex microscopic  Sjogren's syndrome  with keratoconjunctivitis sicca (HCC) - ANA+, Ro+, La+: Veronica Jordan continues to have chronic sicca symptoms which have been tolerable overall.  Veronica Jordan uses eyedrops twice a day to alleviate her eye dryness.  Veronica Jordan has not been using any over-the-counter products for her mouth dryness.  We discussed the use of Biotene enzyme amount.  We also discussed using a humidifier in her home and the importance of avoiding the use of a fan at night.  Veronica Jordan continues to see her dentist and ophthalmologist on a regular basis as recommended.  No cervical lymphadenopathy was palpable.  No signs of inflammatory arthritis were noted.  Veronica Jordan has no synovitis on exam.  Veronica Jordan will continue taking Plaquenil as prescribed.  Future orders for CBC, CMP, UA, RF, and SPEP were placed today.  Plan: CBC with Differential/Platelet, CMP14+EGFR, Urinalysis, Routine w reflex microscopic, Rheumatoid factor, Serum protein electrophoresis with reflex  Primary osteoarthritis of both hands: Veronica Jordan has PIP and DIP thickening consistent with osteoarthritis of both hands.  No joint tenderness or inflammation was noted.  Veronica Jordan does have stiffness in both hands on a daily basis.  We discussed the importance of joint protection and muscle strengthening.  Primary osteoarthritis of both knees: Veronica Jordan has good range of motion of both knee joints with no discomfort.  No warmth or effusion was noted.  DDD (degenerative disc disease), lumbar: Veronica Jordan has chronic lower back pain and stiffness.  No symptoms of radiculopathy.   Age-related osteoporosis without current pathological fracture - DEXA ordered by PCP. DEXA normal on 05/26/14.  According to the patient Veronica Jordan is scheduled for an updated DEXA but is unsure when it is. Veronica Jordan is taking a calcium and vitamin D supplement daily.   Fibromyalgia: Veronica Jordan has generalized hyperalgesia and positive tender points.  Veronica Jordan experiences intermittent myalgias and muscle tenderness due to underlying fibromyalgia.  Veronica Jordan has trapezius muscle tension and  muscle tenderness bilaterally today.  We discussed the importance of regular exercise  and good sleep hygiene.   Other medical conditions are listed as follows:   Parkinson's disease ([place])  History of IBS  History of diverticulosis  Dyslipidemia  History of type 2 diabetes mellitus  Essential hypertension  Anxiety and depression  History of kidney stones  Eosinophilic esophagitis  Essential tremor  History of gastroesophageal reflux (GERD)  Orders: Orders Placed This Encounter  Procedures  . Anti-DNA antibody, double-stranded  . Sedimentation rate  . C3 and C4  . CBC with Differential/Platelet  . CMP14+EGFR  . Urinalysis, Routine w reflex microscopic  . Rheumatoid factor  . Serum protein electrophoresis with reflex   No orders of the defined types were placed in this encounter.    Follow-Up Instructions: Return in about 5 months (around 03/10/2021) for Systemic lupus erythematosus, Fibromyalgia, Osteoarthritis, Osteoporosis.   Ofilia Neas, PA-C  Note - This record has been created using Dragon software.  Chart creation errors have been sought, but may not always  have been located. Such creation errors do not reflect on  the standard of medical care.

## 2020-10-08 ENCOUNTER — Other Ambulatory Visit: Payer: Self-pay

## 2020-10-08 ENCOUNTER — Ambulatory Visit (INDEPENDENT_AMBULATORY_CARE_PROVIDER_SITE_OTHER): Payer: Medicare Other | Admitting: Physician Assistant

## 2020-10-08 ENCOUNTER — Encounter: Payer: Self-pay | Admitting: Physician Assistant

## 2020-10-08 VITALS — BP 114/67 | HR 68 | Resp 16 | Ht 58.5 in | Wt 122.8 lb

## 2020-10-08 DIAGNOSIS — M19042 Primary osteoarthritis, left hand: Secondary | ICD-10-CM

## 2020-10-08 DIAGNOSIS — Z8669 Personal history of other diseases of the nervous system and sense organs: Secondary | ICD-10-CM | POA: Diagnosis not present

## 2020-10-08 DIAGNOSIS — M3501 Sicca syndrome with keratoconjunctivitis: Secondary | ICD-10-CM

## 2020-10-08 DIAGNOSIS — M3219 Other organ or system involvement in systemic lupus erythematosus: Secondary | ICD-10-CM | POA: Diagnosis not present

## 2020-10-08 DIAGNOSIS — Z8639 Personal history of other endocrine, nutritional and metabolic disease: Secondary | ICD-10-CM

## 2020-10-08 DIAGNOSIS — M5136 Other intervertebral disc degeneration, lumbar region: Secondary | ICD-10-CM | POA: Diagnosis not present

## 2020-10-08 DIAGNOSIS — E785 Hyperlipidemia, unspecified: Secondary | ICD-10-CM

## 2020-10-08 DIAGNOSIS — Z8719 Personal history of other diseases of the digestive system: Secondary | ICD-10-CM

## 2020-10-08 DIAGNOSIS — Z79899 Other long term (current) drug therapy: Secondary | ICD-10-CM

## 2020-10-08 DIAGNOSIS — M797 Fibromyalgia: Secondary | ICD-10-CM | POA: Diagnosis not present

## 2020-10-08 DIAGNOSIS — M19041 Primary osteoarthritis, right hand: Secondary | ICD-10-CM | POA: Diagnosis not present

## 2020-10-08 DIAGNOSIS — F419 Anxiety disorder, unspecified: Secondary | ICD-10-CM

## 2020-10-08 DIAGNOSIS — I10 Essential (primary) hypertension: Secondary | ICD-10-CM

## 2020-10-08 DIAGNOSIS — M810 Age-related osteoporosis without current pathological fracture: Secondary | ICD-10-CM

## 2020-10-08 DIAGNOSIS — M170 Bilateral primary osteoarthritis of knee: Secondary | ICD-10-CM

## 2020-10-08 DIAGNOSIS — K200 Eosinophilic esophagitis: Secondary | ICD-10-CM

## 2020-10-08 DIAGNOSIS — Z87442 Personal history of urinary calculi: Secondary | ICD-10-CM

## 2020-10-08 DIAGNOSIS — G250 Essential tremor: Secondary | ICD-10-CM

## 2020-10-08 DIAGNOSIS — G20 Parkinson's disease: Secondary | ICD-10-CM

## 2020-10-08 DIAGNOSIS — Z87448 Personal history of other diseases of urinary system: Secondary | ICD-10-CM | POA: Diagnosis not present

## 2020-10-08 DIAGNOSIS — F32A Depression, unspecified: Secondary | ICD-10-CM

## 2020-10-08 NOTE — Patient Instructions (Signed)
Standing Labs We placed an order today for your standing lab work.   Please have your standing labs drawn in July  If possible, please have your labs drawn 2 weeks prior to your appointment so that the provider can discuss your results at your appointment.  We have open lab daily Monday through Thursday from 1:30-4:30 PM and Friday from 1:30-4:00 PM at the office of Dr. Shaili Deveshwar, [facility].   Please be advised, all patients with office appointments requiring lab work will take precedents over walk-in lab work.  If possible, please come for your lab work on Monday and Friday afternoons, as you may experience shorter wait times. The office is located at [address], [address], [place], [place] [address] No appointment is necessary.   Labs are drawn by [facility]. Please bring your co-pay at the time of your lab draw.  You may receive a bill from [facility] for your lab work.  If you wish to have your labs drawn at another location, please call the office 24 hours in advance to send orders.  If you have any questions regarding directions or hours of operation,  please call [phone].   As a reminder, please drink plenty of water prior to coming for your lab work. Thanks!  

## 2020-11-19 DIAGNOSIS — H35341 Macular cyst, hole, or pseudohole, right eye: Secondary | ICD-10-CM | POA: Diagnosis not present

## 2020-11-19 DIAGNOSIS — H35371 Puckering of macula, right eye: Secondary | ICD-10-CM | POA: Diagnosis not present

## 2020-11-19 DIAGNOSIS — H40022 Open angle with borderline findings, high risk, left eye: Secondary | ICD-10-CM | POA: Diagnosis not present

## 2020-11-19 DIAGNOSIS — H53452 Other localized visual field defect, left eye: Secondary | ICD-10-CM | POA: Diagnosis not present

## 2020-11-19 DIAGNOSIS — Z961 Presence of intraocular lens: Secondary | ICD-10-CM | POA: Diagnosis not present

## 2020-11-25 NOTE — Progress Notes (Signed)
Assessment/Plan:   1.  Parkinsons Disease with family history of PD  -does not want genetic testing  -Continue carbidopa/levodopa 25/100, 1 tablet 3 times per day  2.  Depression  -Follows with primary care  3.  Dysphagia  -MBE on October 24, 2019 was normal, but speech therapist noted that solids remained in the mid to distal esophagus for prolonged period and GI referral was recommended.  Patient declined.    4.  Sjogren's, lupus, fibromyalgia             -Follows with Dr. Corliss Skains   Subjective:   Veronica Jordan was seen today in follow up for Parkinsons disease.  My previous records were reviewed prior to todays visit as well as outside records available to me. Pt denies falls.  Pt denies lightheadedness, near syncope.  No hallucinations.  Mood has been good.  Not exercising - states that fatigue prevents her from doing that.    Current prescribed movement disorder medications: Continue carbidopa/levodopa 25/100, 1 tablet 3 times daily    ALLERGIES:   Allergies  Allergen Reactions  . Clindamycin/Lincomycin     hives  . Codeine Itching  . Demerol [Meperidine] Nausea And Vomiting  . Erythromycin   . Meperidine Hcl   . Ondansetron Hcl   . Penicillins   . Sulfonamide Derivatives   . Zofran [Ondansetron Hcl]     CURRENT MEDICATIONS:  Outpatient Encounter Medications as of 11/26/2020  Medication Sig  . albuterol (VENTOLIN HFA) 108 (90 Base) MCG/ACT inhaler SMARTSIG:2 Inhalation Via Inhaler Every 4 Hours  . amLODipine (NORVASC) 10 MG tablet Take 10 mg by mouth daily.  Marland Kitchen aspirin 81 MG tablet Take 81 mg by mouth daily.  . Calcium Carbonate-Vitamin D 600-400 MG-UNIT tablet Take 1 tablet by mouth daily.  . Carbidopa-Levodopa ER (SINEMET CR) 25-100 MG tablet controlled release TAKE 1 TABLET IN THE MORNING, AT NOON, AND AT BEDTIME  . Coenzyme Q10 (COQ-10) 50 MG CAPS Take 1 capsule by mouth daily.  Marland Kitchen DIOVAN 320 MG tablet Take 320 mg by mouth daily.   Marland Kitchen escitalopram  (LEXAPRO) 10 MG tablet Take 10 mg by mouth daily.   . fluticasone (FLONASE) 50 MCG/ACT nasal spray SHAKE LQ AND U 2 SPRAYS IEN D  . 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 levothyroxine (SYNTHROID, LEVOTHROID) 25 MCG tablet Take 25 mcg by mouth daily.  Marland Kitchen losartan (COZAAR) 100 MG tablet Take 100 mg by mouth daily.  . Omega-3 Fatty Acids (OMEGA 3 PO) Take by mouth daily.  . pantoprazole (PROTONIX) 40 MG tablet Take 40 mg by mouth daily.   . simvastatin (ZOCOR) 20 MG tablet Take 20 mg by mouth at bedtime.  . vitamin B-12 (CYANOCOBALAMIN) 1000 MCG tablet Take 1,000 mcg by mouth daily.   No facility-administered encounter medications on file as of 11/26/2020.    Objective:   PHYSICAL EXAMINATION:    VITALS:  There were no vitals filed for this visit.  GEN:  The patient appears stated age and is in NAD. HEENT:  Normocephalic, atraumatic.  The mucous membranes are moist. The superficial temporal arteries are without ropiness or tenderness. CV:  RRR Lungs:  CTAB Neck/HEME:  There are no carotid bruits bilaterally.  Neurological examination:  Orientation: The patient is alert and oriented x3. Cranial nerves: There is good facial symmetry with min facial hypomimia. The speech is fluent and clear. Soft palate rises symmetrically and there is no tongue deviation.  Hearing is intact to conversational tone. Sensation: Sensation is intact to light touch throughout Motor: Strength is at least antigravity x4.  Movement examination: Tone: There is nl tone in the UE/LE Abnormal movements: she has L foot tremor and rare L hand tremor Coordination:  There is no decremation with RAM's, with any form of RAMS, including alternating supination and pronation of the forearm, hand opening and closing, finger taps, heel taps and toe taps. Gait and Station: The patient has no difficulty arising out of a deep-seated chair without the use of the hands.  The patient's stride length is good but she appears tenuous.    I have reviewed and interpreted the following labs independently    Chemistry      Component Value Date/Time   NA 139 08/06/2020 1417   NA 141 03/10/2020 1448   K 4.1 08/06/2020 1417   CL 104 08/06/2020 1417   CO2 26 08/06/2020 1417   BUN 25 08/06/2020 1417   BUN 12 03/10/2020 1448   CREATININE 1.09 (H) 08/06/2020 1417      Component Value Date/Time   CALCIUM 9.5 08/06/2020 1417   ALKPHOS 106 03/10/2020 1448   AST 15 08/06/2020 1417   ALT 6 08/06/2020 1417   BILITOT 0.4 08/06/2020 1417   BILITOT 0.3 03/10/2020 1448       Lab Results  Component Value Date   WBC 5.2 08/06/2020   HGB 11.1 (L) 08/06/2020   HCT 33.6 (L) 08/06/2020   MCV 87.7 08/06/2020   PLT 205 08/06/2020    Lab Results  Component Value Date   TSH 2.57 07/24/2018     Total time spent on today's visit was 20 minutes, including both face-to-face time and nonface-to-face time.  Time included that spent on review of records (prior notes available to me/labs/imaging if pertinent), discussing treatment and goals, answering patient's questions and coordinating care.  Cc:  Cleatis Polka., MD

## 2020-11-26 ENCOUNTER — Encounter: Payer: Self-pay | Admitting: Neurology

## 2020-11-26 ENCOUNTER — Other Ambulatory Visit: Payer: Self-pay

## 2020-11-26 ENCOUNTER — Ambulatory Visit (INDEPENDENT_AMBULATORY_CARE_PROVIDER_SITE_OTHER): Payer: Medicare Other | Admitting: Neurology

## 2020-11-26 VITALS — BP 114/56 | HR 69 | Ht <= 58 in | Wt 126.0 lb

## 2020-11-26 DIAGNOSIS — G20 Parkinson's disease: Secondary | ICD-10-CM

## 2020-11-26 NOTE — Patient Instructions (Signed)
T-Shirt design contest   SUBMIT UNIQUE DESIGN TO WIN!  Winning design will be featured on T-shirts to support our local Parkinson's patients.   Contest rules and information in on link below .   Contest ends on August 31,2022    How to Enter Submit your own unique quote or a design that will stand out and support the ideas behind Parkinson's Awareness  This contest is open to everyone. The winning design or quote will be selected by a group of unbiased judges. Judges will vote based on quality, relevance, and aesthetic. Judges will not have access to the names associated with the submission. Submissions accepted until: March 02, 2021 What You Win Your own quote or design will be printed on a t-shirt that will support Parkinson's Awareness!! Feel good knowing your quote/artwork will help spread awareness for Parkinson's contribute and 100% of profits to the local area . Tips for Submissions We will accept submissions in the form of quotes, digital artwork, or hand drawn artwork.  Mission/Theme: The Power of One ! Working together to support and treat to improve quality of life for Parkinson's Patients Rules & Regulations All designs and quotes must be original and not subject to copyright of another person or entity. Distribution and Reproduction Rights for all quotes and artwork will be transferred to [facility] upon submission to the contest via the Contest Submission form on March 02, 2021 Online Resources for Power over Parkinson's Group May 2022  . Local [place] Online Groups  o Power over Parkinson's Group :   - Power Over Parkinson's Patient Education Group will be Wednesday, May 11th at 2pm via Zoom.   - Upcoming Power over Parkinson's Meetings:  2nd Wednesdays of the month at 2 pm:  June 8th, July 13th - Contact Amy Marriott at [email or web address] if interested in participating in this online group o Parkinson's Care Partners Group:     3rd Mondays, Contact Misty Paladino o Atypical Parkinsonian Patient Group:   4th Wednesdays, Contact Misty Paladino o If you are interested in participating in these online groups with Misty, please contact her directly for how to join those meetings.  Her contact information is [email or web address].   . Parkinson Foundation:  www.parkinson.org o PD Health at Home continues:  Mindfulness Mondays, Expert Briefing Tuesdays, Wellness Wednesdays, Take Time Thursdays, Fitness Fridays -Listings for May 2022 are on the website o Upcoming Webinar:  Newly Diagnosed Building a Better Life with Parkinson   Wednesday, May 18th @ 1 pm o Register for expert briefings (webinars) at [email or web address] o  Please check out their website to sign up for emails and see their full online offerings  . [employer] Foundation:  www.michaeljfox.org  o Upcoming Webinar:   What's in your DNA, understanding Parkinson's genetics.  Thursday, May 19th @ 12 noon o Check out additional information on their website to see their full online offerings  . [employer] Phinney Foundation:  www.davisphinneyfoundation.org o Upcoming Webinar:  Stay tuned o Care Partner Monthly Meetup.  With Connie Carpenter Phinney.  First Tuesday of each month, 2 pm o Joy Breaks:  First Wednesday of each month, 2-3 pm. There will be art, doodling, making, crafting, listening, laughing, stories, and everything in between. No art experience necessary. No supplies required. Just show up for joy!  Register on their website. o Check out additional information to Live Well Today on their website  . Parkinson and Movement Disorders (PMD) Alliance:  www.pmdalliance.org o [employer]:  Online Education Events   o Sign up for emails, which are sent weekly to give you updates on programming and online offerings     . Parkinson's Association of [place]:  www.parkinsonassociation.org o Information on online support groups,  education events, and online exercises including Yoga, Parkinson's exercises and more-LOTS of information on links to PD resources and online events o Virtual Support Group through Parkinson's Association of [place]; next one is scheduled for Wednesday, May 4th, 2022 at 2 pm. (These are typically scheduled for the 1st Wednesday of the month at 2 pm).  Visit website for details.  . Additional links for movement activities: o PWR! Moves Classes at [facility] HAVE RESUMED!  Wednesdays 10 and 11 am.  Contact Amy Marriott, PT [email or web address] or [phone] if interested o Here is a link to the PWR!Moves classes on Zoom from [place] Parkinson's Foundation - Daily Mon-Sat at 10:00. Via Zoom, FREE and open to all.  There is also a link below via Facebook if you use that platform. - https://www.parkinsonsmi.org/mpf-programs/exercise-and-movement-activities - https://www.facebook.com/ParkinsonsMI.org/posts/pwr-moves-exercise-class-parkinson-wellness-recovery-online-with-angee-ludwa-pt-/10156827878021813/ o Parkinson's Wellness Recovery (PWR! Moves)  www.pwr4life.org - Info on the PWR! Virtual Experience:  You will have access to our expertise through self-assessment, guided plans that start with the PD-specific fundamentals, educational content, tips, Q&A with an expert, and a growing library of PD-specific pre-recorded and live exercise classes of varying types and intensity - both physical and cognitive! If that is not enough, we offer 1:1 wellness consultations (in-person or virtual) to personalize your PWR! Virtual Experience.  - Check out the PWR! Move of the month on the Parkinson Wellness Recovery website:  https://www.pwr4life.org/pwr-move-of-the-month-4/ o Parkinson Foundation Fitness Fridays:  - As part of the PD Health @ Home program, this free video series focuses each week on one aspect of fitness designed to support people living with Parkinson's.  These weekly  videos highlight the Parkinson Foundation recent fitness guidelines for people with Parkinson's disease. -  www.parkinson.org/understanding-parkinsons/coronavirus/PD-health-at-home/Fitness-Fridays o Dance for PD website is offering free, live-stream classes throughout the week, as well as links to digital library of classes:  https://danceforparkinsons.org/ o Dance for Parkinson's Class:  [employer] [place].  Free offering for people with Parkinson's and care partners; virtual class.  o For more information, contact [phone] or email Magalli Morana at [email or web address] o Virtual dance and Pilates for Parkinson's classes: Click on the Community Tab> Parkinson's Movement Initiative Tab.  To register for classes and for more information, visit www.americandancefestival.org and click the "community" tab.     o [employer] Parkinson's Cycling Classes  - [employer]: 1pm on Fridays-Live classes at [employer] (Contact Margaret Hazen at [email or web address] or [phone]) - [employer]: Virtual Classes Mondays and Thursdays /Live classes Tuesday, Wednesday and Thursday (contact Marlee at [email or web address]  or [phone])  o [place] [employer] - Three levels of classes are offered Tuesdays and Thursdays:  10:30 am,  12 noon & 1:45 pm at [employer] [facility].  - Active Stretching with Maria, New Class starting in March, on Fridays - To observe a class or for  more information, call [phone] or email [email or web address] . [employer]: o Online Caregiver Education Opportunities:  www.well-springsolutions.org/caregiver-education/caregiver-support-group.  You may also contact Jodi Kolada at [email or web address] or [phone].   o [facility] Family Caregivers! Thursday, May 12th 10:00a-1:30p [place]  [facility], [place] [facility], [address], [place] . You may contact Jodi Kolada at  [email or web address] or [phone].   o [facility] Navigator:  Just1Navigator program, a free service to help individuals and families   through the journey of determining care for older adults.  The "Navigator" is a social worker, Nicole Reynolds, who will speak with a prospective client and/or loved ones to provide an assessment of the situation and a set of recommendations for a personalized care plan -- all free of charge, and whether [facility] [employer] offers the needed service or not. If the need is not a service we provide, we are well-connected with reputable programs in town that we can refer you to.  www.well-springsolutions.org or to speak with the Navigator, call [phone].     

## 2020-12-31 ENCOUNTER — Other Ambulatory Visit: Payer: Self-pay | Admitting: Physician Assistant

## 2020-12-31 DIAGNOSIS — Z79899 Other long term (current) drug therapy: Secondary | ICD-10-CM

## 2020-12-31 DIAGNOSIS — M3219 Other organ or system involvement in systemic lupus erythematosus: Secondary | ICD-10-CM

## 2020-12-31 NOTE — Addendum Note (Signed)
Addended by: Kyion Gautier L on: 12/31/2020 02:19 PM   Modules accepted: Orders  

## 2020-12-31 NOTE — Addendum Note (Signed)
Addended by: Doc Mandala L on: 12/31/2020 02:20 PM   Modules accepted: Orders  

## 2020-12-31 NOTE — Addendum Note (Signed)
Addended by: Eaden Hettinger L on: 12/31/2020 02:19 PM   Modules accepted: Orders  

## 2020-12-31 NOTE — Addendum Note (Signed)
Addended by: Tarin Navarez L on: 12/31/2020 02:19 PM   Modules accepted: Orders  

## 2020-12-31 NOTE — Addendum Note (Signed)
Addended by: Isabelly Kobler L on: 12/31/2020 02:20 PM   Modules accepted: Orders  

## 2020-12-31 NOTE — Telephone Encounter (Signed)
Last Visit:10/08/2020  Next Visit:  03/17/2021  Labs: 08/06/2020 Mild anemia noted which is a stable.  GFR is mildly decreased. Glucose is mildly elevated.  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 No macular OCT atrophy.   Current Dose per office note 10/08/2020: Plaquenil 200 mg 1 tablet by mouth daily VA:NVBTY systemic lupus erythematosus with other organ involvement  Patient advised she is due to update labs. Patient will come 01/07/2021 to update. Patient will also be seeing her eye doctor and will have them send results.   Last Fill: 09/09/2020   Okay to refill Plaquenil?

## 2020-12-31 NOTE — Addendum Note (Signed)
Addended by: Henriette Combs on: 12/31/2020 02:19 PM   Modules accepted: Orders

## 2020-12-31 NOTE — Addendum Note (Signed)
Addended by: Henriette Combs on: 12/31/2020 02:20 PM   Modules accepted: Orders

## 2021-01-04 DIAGNOSIS — J069 Acute upper respiratory infection, unspecified: Secondary | ICD-10-CM | POA: Diagnosis not present

## 2021-01-04 DIAGNOSIS — R051 Acute cough: Secondary | ICD-10-CM | POA: Diagnosis not present

## 2021-01-07 ENCOUNTER — Telehealth: Payer: Self-pay | Admitting: Rheumatology

## 2021-01-07 ENCOUNTER — Encounter: Payer: Self-pay | Admitting: Physician Assistant

## 2021-01-07 DIAGNOSIS — H53452 Other localized visual field defect, left eye: Secondary | ICD-10-CM | POA: Diagnosis not present

## 2021-01-07 DIAGNOSIS — L930 Discoid lupus erythematosus: Secondary | ICD-10-CM | POA: Diagnosis not present

## 2021-01-07 DIAGNOSIS — L931 Subacute cutaneous lupus erythematosus: Secondary | ICD-10-CM | POA: Diagnosis not present

## 2021-01-07 DIAGNOSIS — M3219 Other organ or system involvement in systemic lupus erythematosus: Secondary | ICD-10-CM

## 2021-01-07 DIAGNOSIS — M3501 Sicca syndrome with keratoconjunctivitis: Secondary | ICD-10-CM

## 2021-01-07 DIAGNOSIS — Z87448 Personal history of other diseases of urinary system: Secondary | ICD-10-CM

## 2021-01-07 DIAGNOSIS — Z79899 Other long term (current) drug therapy: Secondary | ICD-10-CM | POA: Diagnosis not present

## 2021-01-07 NOTE — Telephone Encounter (Signed)
Lab Orders released.  

## 2021-01-07 NOTE — Addendum Note (Signed)
Addended by: Henriette Combs on: 01/07/2021 04:04 PM   Modules accepted: Orders

## 2021-01-07 NOTE — Telephone Encounter (Signed)
Patient going to [facility] Monday for draw. Please release orders. 

## 2021-01-13 DIAGNOSIS — Z79899 Other long term (current) drug therapy: Secondary | ICD-10-CM | POA: Diagnosis not present

## 2021-01-13 DIAGNOSIS — M3501 Sicca syndrome with keratoconjunctivitis: Secondary | ICD-10-CM | POA: Diagnosis not present

## 2021-01-13 DIAGNOSIS — M3219 Other organ or system involvement in systemic lupus erythematosus: Secondary | ICD-10-CM | POA: Diagnosis not present

## 2021-01-13 DIAGNOSIS — Z87448 Personal history of other diseases of urinary system: Secondary | ICD-10-CM | POA: Diagnosis not present

## 2021-01-18 ENCOUNTER — Other Ambulatory Visit: Payer: Self-pay | Admitting: *Deleted

## 2021-01-18 LAB — PROTEIN ELECTROPHORESIS, SERUM, WITH REFLEX
A/G Ratio: 1.2 (ref 0.7–1.7)
Albumin ELP: 3.7 g/dL (ref 2.9–4.4)
Alpha 1: 0.2 g/dL (ref 0.0–0.4)
Alpha 2: 0.8 g/dL (ref 0.4–1.0)
Beta: 0.8 g/dL (ref 0.7–1.3)
Gamma Globulin: 1.2 g/dL (ref 0.4–1.8)
Globulin, Total: 3 g/dL (ref 2.2–3.9)

## 2021-01-18 LAB — CBC WITH DIFFERENTIAL/PLATELET
Basophils Absolute: 0.1 10*3/uL (ref 0.0–0.2)
Basos: 1 %
EOS (ABSOLUTE): 0.3 10*3/uL (ref 0.0–0.4)
Eos: 4 %
Hematocrit: 37.7 % (ref 34.0–46.6)
Hemoglobin: 12 g/dL (ref 11.1–15.9)
Immature Grans (Abs): 0.1 10*3/uL (ref 0.0–0.1)
Immature Granulocytes: 1 %
Lymphocytes Absolute: 1.9 10*3/uL (ref 0.7–3.1)
Lymphs: 24 %
MCH: 28.8 pg (ref 26.6–33.0)
MCHC: 31.8 g/dL (ref 31.5–35.7)
MCV: 91 fL (ref 79–97)
Monocytes Absolute: 0.5 10*3/uL (ref 0.1–0.9)
Monocytes: 7 %
Neutrophils Absolute: 5 10*3/uL (ref 1.4–7.0)
Neutrophils: 63 %
Platelets: 244 10*3/uL (ref 150–450)
RBC: 4.16 x10E6/uL (ref 3.77–5.28)
RDW: 12.6 % (ref 11.7–15.4)
WBC: 7.9 10*3/uL (ref 3.4–10.8)

## 2021-01-18 LAB — CMP14+EGFR
ALT: 6 IU/L (ref 0–32)
AST: 17 IU/L (ref 0–40)
Albumin/Globulin Ratio: 1.6 (ref 1.2–2.2)
Albumin: 4.1 g/dL (ref 3.7–4.7)
Alkaline Phosphatase: 111 IU/L (ref 44–121)
BUN/Creatinine Ratio: 18 (ref 12–28)
BUN: 16 mg/dL (ref 8–27)
Bilirubin Total: 0.4 mg/dL (ref 0.0–1.2)
CO2: 23 mmol/L (ref 20–29)
Calcium: 9.3 mg/dL (ref 8.7–10.3)
Chloride: 102 mmol/L (ref 96–106)
Creatinine, Ser: 0.87 mg/dL (ref 0.57–1.00)
Globulin, Total: 2.6 g/dL (ref 1.5–4.5)
Glucose: 142 mg/dL — ABNORMAL HIGH (ref 65–99)
Potassium: 4 mmol/L (ref 3.5–5.2)
Sodium: 141 mmol/L (ref 134–144)
Total Protein: 6.7 g/dL (ref 6.0–8.5)
eGFR: 70 mL/min/{1.73_m2} (ref >59)

## 2021-01-18 LAB — ANTI-DNA ANTIBODY, DOUBLE-STRANDED: dsDNA Ab: 4 IU/mL (ref 0–9)

## 2021-01-18 LAB — MICROSCOPIC EXAMINATION
Bacteria, UA: NONE SEEN
Casts: NONE SEEN /lpf

## 2021-01-18 LAB — URINALYSIS, ROUTINE W REFLEX MICROSCOPIC
Bilirubin, UA: NEGATIVE
Glucose, UA: NEGATIVE
Leukocytes,UA: NEGATIVE
Nitrite, UA: NEGATIVE
RBC, UA: NEGATIVE
Specific Gravity, UA: 1.028 (ref 1.005–1.030)
Urobilinogen, Ur: 0.2 mg/dL (ref 0.2–1.0)
pH, UA: 5.5 (ref 5.0–7.5)

## 2021-01-18 LAB — C3 AND C4
Complement C3, Serum: 121 mg/dL (ref 82–167)
Complement C4, Serum: 17 mg/dL (ref 12–38)

## 2021-01-18 LAB — RHEUMATOID FACTOR: Rheumatoid fact SerPl-aCnc: 194.1 IU/mL — ABNORMAL HIGH (ref <14.0)

## 2021-01-18 LAB — SEDIMENTATION RATE: Sed Rate: 11 mm/hr (ref 0–40)

## 2021-01-18 NOTE — Progress Notes (Signed)
Glucose is elevated-142. Rest of CMP WNL.  CBC WNL.  SPEP did not reveal any abnormal monoclonal proteins.  RF remains positive. Complements and ESR WNL.  dsDNA is negative.  UA revealed 1+ protein.  Please add protein creatinine ratio.

## 2021-01-23 LAB — PROTEIN / CREATININE RATIO, URINE

## 2021-01-23 LAB — SPECIMEN STATUS REPORT

## 2021-01-24 ENCOUNTER — Other Ambulatory Visit: Payer: Self-pay | Admitting: *Deleted

## 2021-01-24 DIAGNOSIS — M3501 Sicca syndrome with keratoconjunctivitis: Secondary | ICD-10-CM

## 2021-01-24 DIAGNOSIS — Z79899 Other long term (current) drug therapy: Secondary | ICD-10-CM

## 2021-01-24 DIAGNOSIS — M3219 Other organ or system involvement in systemic lupus erythematosus: Secondary | ICD-10-CM

## 2021-01-24 NOTE — Progress Notes (Signed)
Yes we would still like the protein creatinine ratio to be performed. Ok to place new order for [facility] or she can come here to have [facility] drawn [facility].

## 2021-01-24 NOTE — Progress Notes (Signed)
Protein creatinine ratio was canceled.  Please check on status.

## 2021-01-25 ENCOUNTER — Other Ambulatory Visit: Payer: Self-pay | Admitting: Neurology

## 2021-01-26 ENCOUNTER — Other Ambulatory Visit: Payer: Self-pay | Admitting: *Deleted

## 2021-01-26 DIAGNOSIS — M3501 Sicca syndrome with keratoconjunctivitis: Secondary | ICD-10-CM

## 2021-01-26 DIAGNOSIS — Z79899 Other long term (current) drug therapy: Secondary | ICD-10-CM

## 2021-01-26 DIAGNOSIS — M3219 Other organ or system involvement in systemic lupus erythematosus: Secondary | ICD-10-CM

## 2021-01-26 NOTE — Addendum Note (Signed)
Addended by: Audrie Lia on: 01/26/2021 11:39 AM   Modules accepted: Orders

## 2021-01-26 NOTE — Progress Notes (Signed)
Order released for [facility].

## 2021-01-27 DIAGNOSIS — Z79899 Other long term (current) drug therapy: Secondary | ICD-10-CM | POA: Diagnosis not present

## 2021-01-27 DIAGNOSIS — M3219 Other organ or system involvement in systemic lupus erythematosus: Secondary | ICD-10-CM | POA: Diagnosis not present

## 2021-01-27 DIAGNOSIS — M3501 Sicca syndrome with keratoconjunctivitis: Secondary | ICD-10-CM | POA: Diagnosis not present

## 2021-01-28 ENCOUNTER — Other Ambulatory Visit: Payer: Self-pay | Admitting: Surgery

## 2021-01-28 DIAGNOSIS — N6489 Other specified disorders of breast: Secondary | ICD-10-CM

## 2021-01-29 LAB — PROTEIN / CREATININE RATIO, URINE
Creatinine, Urine: 215.2 mg/dL
Protein, Ur: 27.9 mg/dL
Protein/Creat Ratio: 130 mg/g creat (ref 0–200)

## 2021-03-03 NOTE — Progress Notes (Signed)
Office Visit Note  Patient: Veronica Jordan             Date of Birth: 1946/03/26           MRN: [id number]             PCP: Ginger Organ., MD Referring: Ginger Organ., MD Visit Date: 03/17/2021 Occupation: @GUAROCC @  Subjective:  Medication management.   History of Present Illness: Veronica Jordan is a 75 y.o. female with a history of systemic lupus, Sjogren's, osteoarthritis and osteoporosis.  She denies any history of recent malar rash, photosensitivity, oral ulcers, lymphadenopathy or Raynaud's phenomenon.  She denies any inflammatory arthritis.  She continues to have dry eyes.  She states dry mouth symptoms are masked due to Parkinson's.  She has been experiencing increased drooling.  She had a oral ulcer recently related to the denture use.  She has been followed by ophthalmologist for optic neuritis.  She is also has been followed by neurologist closely for Parkinson's disease and tremors.  She [facility] for nephritis.  Activities of Daily Living:  Patient reports morning stiffness for 2 hours.   Patient Denies nocturnal pain.  Difficulty dressing/grooming: Denies Difficulty climbing stairs: Denies Difficulty getting out of chair: Denies Difficulty using hands for taps, buttons, cutlery, and/or writing: Reports  Review of Systems  Constitutional:  Positive for fatigue.  HENT:  Negative for mouth sores, mouth dryness and nose dryness.   Eyes:  Positive for dryness. Negative for pain and itching.  Respiratory:  Positive for shortness of breath. Negative for difficulty breathing.   Cardiovascular:  Negative for chest pain and palpitations.  Gastrointestinal:  Negative for blood in stool, constipation and diarrhea.  Endocrine: Negative for increased urination.  Genitourinary:  Negative for difficulty urinating.  Musculoskeletal:  Positive for joint pain, joint pain, myalgias, morning stiffness, muscle tenderness and myalgias. Negative for joint  swelling.  Skin:  Negative for color change, rash, redness and sensitivity to sunlight.  Allergic/Immunologic: Negative for susceptible to infections.  Neurological:  Negative for dizziness, numbness, headaches and memory loss.  Hematological:  Negative for bruising/bleeding tendency and swollen glands.  Psychiatric/Behavioral:  Positive for depressed mood. Negative for confusion and sleep disturbance. The patient is not nervous/anxious.    PMFS History:  Patient Active Problem List   Diagnosis Date Noted   Sjogren's syndrome with keratoconjunctivitis sicca ([place]) 08/07/2018   History of nephritis 08/07/2018   History of optic neuritis 08/07/2018   Fibromyalgia 08/07/2018   Primary osteoarthritis of both hands 08/07/2018   Primary osteoarthritis of both knees 08/07/2018   DDD (degenerative disc disease), lumbar 03/50/0938   Eosinophilic esophagitis 18/29/9371   History of IBS 08/07/2018   History of gastroesophageal reflux (GERD) 08/07/2018   History of diverticulosis 08/07/2018   History of type 2 diabetes mellitus 08/07/2018   History of kidney stones 08/07/2018   History of peripheral neuropathy 08/07/2018   Dyslipidemia 08/07/2018   Anxiety and depression 08/07/2018   Essential tremor 08/07/2018   Postmenopausal hormone replacement therapy 03/05/2015   Osteoporosis 03/05/2015   Atrophic vaginitis 03/05/2015   HYPERTHYROIDISM 12/13/2007   Hypercholesterolemia 12/13/2007   Essential hypertension 12/13/2007   ALLERGIC RHINITIS 12/13/2007   LUPUS ERYTHEMATOSUS 12/13/2007   DYSPNEA 12/13/2007    Past Medical History:  Diagnosis Date   Allergic rhinitis    Anemia    Anxiety and depression    Depression    Diabetes mellitus    Diverticulitis    Fibromyalgia  GERD (gastroesophageal reflux disease)    History of gallstones    History of kidney stones    Hyperlipemia    Hypertension    Hypothyroidism    IBS (irritable bowel syndrome)    Osteoarthritis    Osteoporosis     Renal calculi    Sjogren's syndrome ([place])    Systemic lupus ([place]) 2001    Family History  Problem Relation Age of Onset   Asthma Mother    COPD Mother    Heart disease Father    Asthma Sister    Parkinsonism Other        MGM   Hypertension Brother    Heart attack Brother    Arthritis Maternal Grandmother    Heart disease Maternal Grandfather    Lupus Paternal Grandmother    Heart attack Paternal Grandfather    Hypertension Sister    Chronic fatigue Daughter    Colon cancer Neg Hx    Esophageal cancer Neg Hx    Stomach cancer Neg Hx    Past Surgical History:  Procedure Laterality Date   APPENDECTOMY  1970   BREAST BIOPSY Left 08/2020   BREAST EXCISIONAL BIOPSY Left    CATARACT EXTRACTION     bilateral   CESAREAN SECTION  1976, 1977   x2   CHOLECYSTECTOMY  1988   TONSILLECTOMY AND ADENOIDECTOMY     TOTAL ABDOMINAL HYSTERECTOMY  2000   BSO due to fibroids   Social History   Social History Narrative   Youngest daughter died in [place].   Right handed    Lives in a two story home that has a basement   Immunization History  Administered Date(s) Administered   PFIZER(Purple Top)SARS-COV-2 Vaccination 08/29/2019, 09/26/2019     Objective: Vital Signs: BP 135/71 (BP Location: Left Arm, Patient Position: Sitting, Cuff Size: Normal)   Pulse 66   Ht 4' 11"  (1.499 m)   Wt 132 lb 6.4 oz (60.1 kg)   LMP 07/03/1998 (Approximate)   BMI 26.74 kg/m    Physical Exam Vitals and nursing note reviewed.  Constitutional:      Appearance: She is well-developed.  HENT:     Head: Normocephalic and atraumatic.  Eyes:     Conjunctiva/sclera: Conjunctivae normal.  Cardiovascular:     Rate and Rhythm: Normal rate and regular rhythm.     Heart sounds: Normal heart sounds.  Pulmonary:     Effort: Pulmonary effort is normal.     Breath sounds: Normal breath sounds.  Abdominal:     General: Bowel sounds are normal.     Palpations: Abdomen is soft.  Musculoskeletal:      Cervical back: Normal range of motion.  Lymphadenopathy:     Cervical: No cervical adenopathy.  Skin:    General: Skin is warm and dry.     Capillary Refill: Capillary refill takes less than 2 seconds.  Neurological:     Mental Status: She is alert and oriented to person, place, and time.  Psychiatric:        Behavior: Behavior normal.     Musculoskeletal Exam: C-spine was in good range of motion.  Shoulder joints, elbow joints, wrist joints with good range of motion.  PIP and DIP thickening was noted with no synovitis.  Hip joints and knee joints with good range of motion.  She had no tenderness over ankles or MTPs.  CDAI Exam: CDAI Score: -- Patient Global: --; Provider Global: -- Swollen: --; Tender: -- Joint Exam 03/17/2021   No  joint exam has been documented for this visit   There is currently no information documented on the homunculus. Go to the Rheumatology activity and complete the homunculus joint exam.  Investigation: No additional findings.  Imaging: No results found.  Recent Labs: Lab Results  Component Value Date   WBC 7.9 01/13/2021   HGB 12.0 01/13/2021   PLT 244 01/13/2021   NA 141 01/13/2021   K 4.0 01/13/2021   CL 102 01/13/2021   CO2 23 01/13/2021   GLUCOSE 142 (H) 01/13/2021   BUN 16 01/13/2021   CREATININE 0.87 01/13/2021   BILITOT 0.4 01/13/2021   ALKPHOS 111 01/13/2021   AST 17 01/13/2021   ALT 6 01/13/2021   PROT 6.7 01/13/2021   ALBUMIN 4.1 01/13/2021   CALCIUM 9.3 01/13/2021   GFRAA 58 (L) 08/06/2020   11/13/2020 SPEP normal, UA showed 1+ protein, C3-C4 normal, double-stranded DNA negative, sed rate 11, urine protein creatinine ratio 130, RF 194.1  Speciality Comments: PLQ Eye Exam: 01/07/2021 WNL. @ [facility] Follow up in 6 months.  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  There is no history of oral ulcers, nasal ulcers, malar rash, photosensitivity, Raynaud's phenomenon, lymphadenopathy or inflammatory arthritis.  Plan: Protein / creatinine ratio, urine, Anti-DNA antibody, double-stranded, C3 and C4, Sedimentation rate, Hydroxychloroquine, Blood in December  High risk medication use - Plaquenil 200 mg 1 tablet by mouth daily. PLQ Eye Exam: 01/07/2021 - Plan: CBC with Differential/Platelet, COMPLETE METABOLIC PANEL WITH GFR in December.  Most recent labs were discussed with the patient.  History of optic neuritis - followed by Dr. Jerline Pain with [facility].  History of nephritis - She has been in remission and sees her nephrologist at [facility] once yearly.   Sjogren's syndrome with keratoconjunctivitis sicca (HCC) - ANA+, Ro+, La+, positive RF: She continues to have dry eyes.  Although she has been having increased drooling due to Parkinson's.  Primary osteoarthritis of both hands-joint protection muscle strengthening was discussed.  Primary osteoarthritis of both knees-she has off-and-on discomfort in her knee joints.  She is also gained some weight.  Weight loss diet and exercise was emphasized.  DDD (degenerative disc disease), lumbar-she is off-and-on discomfort in the lower back.  Weight loss will be helpful.  Age-related osteoporosis without current pathological fracture - DEXA ordered by PCP. DEXA normal on 05/26/14.  Repeat DEXA scheduled for the next week.  Fibromyalgia - He continues to have some generalized pain, positive tender points.  Need for regular exercise was discussed.  Parkinson's disease ([place]) - she is followed by Dr. Verdis Prime  Essential  hypertension-blood pressure is normal today.  Of the medical problems are listed as follows:  Dyslipidemia  History of type 2 diabetes mellitus  History of diverticulosis  History of gastroesophageal reflux (GERD)  Eosinophilic esophagitis  History of IBS  History of kidney stones  Anxiety and depression  Orders: Orders Placed This Encounter  Procedures   Protein / creatinine ratio, urine   CBC with Differential/Platelet   COMPLETE METABOLIC PANEL WITH GFR   Anti-DNA antibody, double-stranded   C3 and C4   Sedimentation rate   Hydroxychloroquine,  Blood    No orders of the defined types were placed in this encounter.   Follow-Up Instructions: Return in about 5 months (around 08/17/2021) for Systemic lupus, Osteoarthritis.   Bo Merino, MD  Note - This record has been created using Editor, commissioning.  Chart creation errors have been sought, but may not always  have been located. Such creation errors do not reflect on  the standard of medical care.

## 2021-03-04 DIAGNOSIS — I10 Essential (primary) hypertension: Secondary | ICD-10-CM | POA: Diagnosis not present

## 2021-03-14 ENCOUNTER — Other Ambulatory Visit: Payer: Self-pay | Admitting: Physician Assistant

## 2021-03-14 DIAGNOSIS — M3219 Other organ or system involvement in systemic lupus erythematosus: Secondary | ICD-10-CM

## 2021-03-14 NOTE — Telephone Encounter (Signed)
Next Visit: 03/17/2021  Last Visit: 10/08/2020  Labs: 01/13/2021 Glucose is elevated-142. Rest of CMP WNL.  CBC WNL.  SPEP did not reveal any abnormal monoclonal proteins.  RF remains positive. Complements and ESR WNL.  dsDNA is negative.  UA revealed 1+ protein.  01/27/2021 Protein creatinine ratio WNL  Eye exam: 01/07/2021   Current Dose per office note on 10/08/2020: Plaquenil 200 mg 1 tablet by mouth daily.   [id number] systemic lupus erythematosus with other organ involvement  Last Fill: 01/04/2021  Okay to refill Plaquenil?

## 2021-03-17 ENCOUNTER — Other Ambulatory Visit: Payer: Self-pay

## 2021-03-17 ENCOUNTER — Ambulatory Visit (INDEPENDENT_AMBULATORY_CARE_PROVIDER_SITE_OTHER): Payer: Medicare Other | Admitting: Rheumatology

## 2021-03-17 ENCOUNTER — Encounter: Payer: Self-pay | Admitting: Rheumatology

## 2021-03-17 VITALS — BP 135/71 | HR 66 | Ht 59.0 in | Wt 132.4 lb

## 2021-03-17 DIAGNOSIS — M810 Age-related osteoporosis without current pathological fracture: Secondary | ICD-10-CM

## 2021-03-17 DIAGNOSIS — I10 Essential (primary) hypertension: Secondary | ICD-10-CM | POA: Diagnosis not present

## 2021-03-17 DIAGNOSIS — M797 Fibromyalgia: Secondary | ICD-10-CM | POA: Diagnosis not present

## 2021-03-17 DIAGNOSIS — M5136 Other intervertebral disc degeneration, lumbar region: Secondary | ICD-10-CM | POA: Diagnosis not present

## 2021-03-17 DIAGNOSIS — Z79899 Other long term (current) drug therapy: Secondary | ICD-10-CM

## 2021-03-17 DIAGNOSIS — Z87442 Personal history of urinary calculi: Secondary | ICD-10-CM

## 2021-03-17 DIAGNOSIS — M19041 Primary osteoarthritis, right hand: Secondary | ICD-10-CM

## 2021-03-17 DIAGNOSIS — M3501 Sicca syndrome with keratoconjunctivitis: Secondary | ICD-10-CM

## 2021-03-17 DIAGNOSIS — Z87448 Personal history of other diseases of urinary system: Secondary | ICD-10-CM

## 2021-03-17 DIAGNOSIS — G20 Parkinson's disease: Secondary | ICD-10-CM | POA: Diagnosis not present

## 2021-03-17 DIAGNOSIS — Z8669 Personal history of other diseases of the nervous system and sense organs: Secondary | ICD-10-CM

## 2021-03-17 DIAGNOSIS — E785 Hyperlipidemia, unspecified: Secondary | ICD-10-CM

## 2021-03-17 DIAGNOSIS — M19042 Primary osteoarthritis, left hand: Secondary | ICD-10-CM

## 2021-03-17 DIAGNOSIS — K200 Eosinophilic esophagitis: Secondary | ICD-10-CM

## 2021-03-17 DIAGNOSIS — M3219 Other organ or system involvement in systemic lupus erythematosus: Secondary | ICD-10-CM | POA: Diagnosis not present

## 2021-03-17 DIAGNOSIS — M51369 Other intervertebral disc degeneration, lumbar region without mention of lumbar back pain or lower extremity pain: Secondary | ICD-10-CM

## 2021-03-17 DIAGNOSIS — G250 Essential tremor: Secondary | ICD-10-CM

## 2021-03-17 DIAGNOSIS — M170 Bilateral primary osteoarthritis of knee: Secondary | ICD-10-CM | POA: Diagnosis not present

## 2021-03-17 DIAGNOSIS — F32A Depression, unspecified: Secondary | ICD-10-CM

## 2021-03-17 DIAGNOSIS — Z8639 Personal history of other endocrine, nutritional and metabolic disease: Secondary | ICD-10-CM

## 2021-03-17 DIAGNOSIS — G20A1 Parkinson's disease without dyskinesia, without mention of fluctuations: Secondary | ICD-10-CM

## 2021-03-17 DIAGNOSIS — F419 Anxiety disorder, unspecified: Secondary | ICD-10-CM

## 2021-03-17 DIAGNOSIS — Z8719 Personal history of other diseases of the digestive system: Secondary | ICD-10-CM

## 2021-03-17 NOTE — Patient Instructions (Signed)
Standing Labs We placed an order today for your standing lab work.   Please have your standing labs drawn in December and every 5 months  If possible, please have your labs drawn 2 weeks prior to your appointment so that the provider can discuss your results at your appointment.  Please note that you may see your imaging and lab results in MyChart before we have reviewed them. We may be awaiting multiple results to interpret others before contacting you. Please allow our office up to 72 hours to thoroughly review all of the results before contacting the office for clarification of your results.  We have open lab daily: Monday through Thursday from 1:30-4:30 PM and Friday from 1:30-4:00 PM at the office of Dr. Kamie Korber, [facility].   Please be advised, all patients with office appointments requiring lab work will take precedent over walk-in lab work.  If possible, please come for your lab work on Monday and Friday afternoons, as you may experience shorter wait times. The office is located at [address], [address], [place], [place] [address] No appointment is necessary.   Labs are drawn by [facility]. Please bring your co-pay at the time of your lab draw.  You may receive a bill from [facility] for your lab work.  If you wish to have your labs drawn at another location, please call the office 24 hours in advance to send orders.  If you have any questions regarding directions or hours of operation,  please call [phone].   As a reminder, please drink plenty of water prior to coming for your lab work. Thanks!   Vaccines You are taking a medication(s) that can suppress your immune system.  The following immunizations are recommended: Flu annually Covid-19  Td/Tdap (tetanus, diphtheria, pertussis) every 10 years Pneumonia (Prevnar 15 then Pneumovax 23 at least 1 year apart.  Alternatively, can take Prevnar 20 without needing additional dose) Shingrix : 2 doses from 4  weeks to 6 months apart  Please check with your PCP to make sure you are up to date.   Heart Disease Prevention   Your inflammatory disease increases your risk of heart disease which includes heart attack, stroke, atrial fibrillation (irregular heartbeats), high blood pressure, heart failure and atherosclerosis (plaque in the arteries).  It is important to reduce your risk by:   Keep blood pressure, cholesterol, and blood sugar at healthy levels   Smoking Cessation   Maintain a healthy weight  BMI 20-25   Eat a healthy diet  Plenty of fresh fruit, vegetables, and whole grains  Limit saturated fats, foods high in sodium, and added sugars  DASH and Mediterranean diet   Increase physical activity  Recommend moderate physically activity for 150 minutes per week/ 30 minutes a day for five days a week These can be broken up into three separate ten-minute sessions during the day.   Reduce Stress  Meditation, slow breathing exercises, yoga, coloring books  Dental visits twice a year   

## 2021-03-19 ENCOUNTER — Other Ambulatory Visit: Payer: Self-pay

## 2021-03-19 ENCOUNTER — Ambulatory Visit
Admission: RE | Admit: 2021-03-19 | Discharge: 2021-03-19 | Disposition: A | Discharge status: Home / Self Care | Payer: Medicare Other | Source: Ambulatory Visit | Attending: Surgery | Admitting: Surgery

## 2021-03-19 ENCOUNTER — Other Ambulatory Visit: Payer: Self-pay | Admitting: Surgery

## 2021-03-19 DIAGNOSIS — N6489 Other specified disorders of breast: Secondary | ICD-10-CM

## 2021-03-19 DIAGNOSIS — R922 Inconclusive mammogram: Secondary | ICD-10-CM | POA: Diagnosis not present

## 2021-03-24 DIAGNOSIS — E041 Nontoxic single thyroid nodule: Secondary | ICD-10-CM | POA: Diagnosis not present

## 2021-03-24 DIAGNOSIS — E785 Hyperlipidemia, unspecified: Secondary | ICD-10-CM | POA: Diagnosis not present

## 2021-03-24 DIAGNOSIS — M810 Age-related osteoporosis without current pathological fracture: Secondary | ICD-10-CM | POA: Diagnosis not present

## 2021-03-24 DIAGNOSIS — E039 Hypothyroidism, unspecified: Secondary | ICD-10-CM | POA: Diagnosis not present

## 2021-03-24 DIAGNOSIS — E1149 Type 2 diabetes mellitus with other diabetic neurological complication: Secondary | ICD-10-CM | POA: Diagnosis not present

## 2021-03-29 ENCOUNTER — Encounter: Payer: Self-pay | Admitting: Internal Medicine

## 2021-03-29 DIAGNOSIS — M810 Age-related osteoporosis without current pathological fracture: Secondary | ICD-10-CM | POA: Diagnosis not present

## 2021-03-29 DIAGNOSIS — E785 Hyperlipidemia, unspecified: Secondary | ICD-10-CM | POA: Diagnosis not present

## 2021-03-29 DIAGNOSIS — R82998 Other abnormal findings in urine: Secondary | ICD-10-CM | POA: Diagnosis not present

## 2021-03-29 DIAGNOSIS — E039 Hypothyroidism, unspecified: Secondary | ICD-10-CM | POA: Diagnosis not present

## 2021-03-29 DIAGNOSIS — G20 Parkinson's disease: Secondary | ICD-10-CM | POA: Diagnosis not present

## 2021-03-29 DIAGNOSIS — Z23 Encounter for immunization: Secondary | ICD-10-CM | POA: Diagnosis not present

## 2021-03-29 DIAGNOSIS — E1129 Type 2 diabetes mellitus with other diabetic kidney complication: Secondary | ICD-10-CM | POA: Diagnosis not present

## 2021-03-29 DIAGNOSIS — F339 Major depressive disorder, recurrent, unspecified: Secondary | ICD-10-CM | POA: Diagnosis not present

## 2021-03-29 DIAGNOSIS — E1149 Type 2 diabetes mellitus with other diabetic neurological complication: Secondary | ICD-10-CM | POA: Diagnosis not present

## 2021-03-29 DIAGNOSIS — N1831 Chronic kidney disease, stage 3a: Secondary | ICD-10-CM | POA: Diagnosis not present

## 2021-03-29 DIAGNOSIS — I129 Hypertensive chronic kidney disease with stage 1 through stage 4 chronic kidney disease, or unspecified chronic kidney disease: Secondary | ICD-10-CM | POA: Diagnosis not present

## 2021-03-29 DIAGNOSIS — G629 Polyneuropathy, unspecified: Secondary | ICD-10-CM | POA: Diagnosis not present

## 2021-03-29 DIAGNOSIS — Z0000 Encounter for general adult medical examination without abnormal findings: Secondary | ICD-10-CM | POA: Diagnosis not present

## 2021-05-04 ENCOUNTER — Other Ambulatory Visit: Payer: Self-pay | Admitting: Neurology

## 2021-05-04 DIAGNOSIS — G20 Parkinson's disease: Secondary | ICD-10-CM

## 2021-05-05 ENCOUNTER — Other Ambulatory Visit: Payer: Self-pay

## 2021-06-01 DIAGNOSIS — M329 Systemic lupus erythematosus, unspecified: Secondary | ICD-10-CM | POA: Diagnosis not present

## 2021-06-01 DIAGNOSIS — D631 Anemia in chronic kidney disease: Secondary | ICD-10-CM | POA: Diagnosis not present

## 2021-06-01 DIAGNOSIS — N181 Chronic kidney disease, stage 1: Secondary | ICD-10-CM | POA: Diagnosis not present

## 2021-06-01 DIAGNOSIS — I129 Hypertensive chronic kidney disease with stage 1 through stage 4 chronic kidney disease, or unspecified chronic kidney disease: Secondary | ICD-10-CM | POA: Diagnosis not present

## 2021-06-01 DIAGNOSIS — R809 Proteinuria, unspecified: Secondary | ICD-10-CM | POA: Diagnosis not present

## 2021-06-09 ENCOUNTER — Other Ambulatory Visit: Payer: Self-pay | Admitting: Physician Assistant

## 2021-06-09 ENCOUNTER — Ambulatory Visit: Payer: Medicare Other | Admitting: Neurology

## 2021-06-09 DIAGNOSIS — M3219 Other organ or system involvement in systemic lupus erythematosus: Secondary | ICD-10-CM

## 2021-06-09 NOTE — Telephone Encounter (Signed)
Next Visit: 08/18/2021  Last Visit: 03/17/2021  Labs: 01/13/2021  Eye exam: 01/07/2021 WNL   Current Dose per office note 03/17/2021: Plaquenil 200 mg 1 tablet by mouth daily  [id number] systemic lupus erythematosus with other organ involvement   Last Fill: 03/14/2021  Okay to refill Plaquenil?

## 2021-07-13 DIAGNOSIS — L309 Dermatitis, unspecified: Secondary | ICD-10-CM | POA: Diagnosis not present

## 2021-07-13 DIAGNOSIS — E039 Hypothyroidism, unspecified: Secondary | ICD-10-CM | POA: Diagnosis not present

## 2021-07-13 DIAGNOSIS — U071 COVID-19: Secondary | ICD-10-CM | POA: Diagnosis not present

## 2021-07-13 DIAGNOSIS — G9331 Postviral fatigue syndrome: Secondary | ICD-10-CM | POA: Diagnosis not present

## 2021-07-28 DIAGNOSIS — L209 Atopic dermatitis, unspecified: Secondary | ICD-10-CM | POA: Diagnosis not present

## 2021-07-28 DIAGNOSIS — L300 Nummular dermatitis: Secondary | ICD-10-CM | POA: Diagnosis not present

## 2021-08-05 NOTE — Progress Notes (Unsigned)
Office Visit Note  Patient: Veronica Jordan             Date of Birth: [birth year]           MRN: [id number]             PCP: Cleatis Polka., MD Referring: Cleatis Polka., MD Visit Date: 08/18/2021 Occupation: @GUAROCC @  Subjective:  No chief complaint on file.   History of Present Illness: Veronica Jordan is a 76 y.o. female ***   Activities of Daily Living:  Patient reports morning stiffness for *** {minute/hour:19697}.   Patient {ACTIONS;DENIES/REPORTS:21021675::"Denies"} nocturnal pain.  Difficulty dressing/grooming: {ACTIONS;DENIES/REPORTS:21021675::"Denies"} Difficulty climbing stairs: {ACTIONS;DENIES/REPORTS:21021675::"Denies"} Difficulty getting out of chair: {ACTIONS;DENIES/REPORTS:21021675::"Denies"} Difficulty using hands for taps, buttons, cutlery, and/or writing: {ACTIONS;DENIES/REPORTS:21021675::"Denies"}  No Rheumatology ROS completed.   PMFS History:  Patient Active Problem List   Diagnosis Date Noted   Sjogren's syndrome with keratoconjunctivitis sicca (HCC) 08/07/2018   History of nephritis 08/07/2018   History of optic neuritis 08/07/2018   Fibromyalgia 08/07/2018   Primary osteoarthritis of both hands 08/07/2018   Primary osteoarthritis of both knees 08/07/2018   DDD (degenerative disc disease), lumbar 08/07/2018   Eosinophilic esophagitis 08/07/2018   History of IBS 08/07/2018   History of gastroesophageal reflux (GERD) 08/07/2018   History of diverticulosis 08/07/2018   History of type 2 diabetes mellitus 08/07/2018   History of kidney stones 08/07/2018   History of peripheral neuropathy 08/07/2018   Dyslipidemia 08/07/2018   Anxiety and depression 08/07/2018   Essential tremor 08/07/2018   Postmenopausal hormone replacement therapy 03/05/2015   Osteoporosis 03/05/2015   Atrophic vaginitis 03/05/2015   HYPERTHYROIDISM 12/13/2007   Hypercholesterolemia 12/13/2007   Essential hypertension 12/13/2007   ALLERGIC RHINITIS 12/13/2007    LUPUS ERYTHEMATOSUS 12/13/2007   DYSPNEA 12/13/2007    Past Medical History:  Diagnosis Date   Allergic rhinitis    Anemia    Anxiety and depression    Depression    Diabetes mellitus    Diverticulitis    Fibromyalgia    GERD (gastroesophageal reflux disease)    History of gallstones    History of kidney stones    Hyperlipemia    Hypertension    Hypothyroidism    IBS (irritable bowel syndrome)    Osteoarthritis    Osteoporosis    Renal calculi    Sjogren's syndrome (HCC)    Systemic lupus (HCC) 2001    Family History  Problem Relation Age of Onset   Asthma Mother    COPD Mother    Heart disease Father    Asthma Sister    Parkinsonism Other        MGM   Hypertension Brother    Heart attack Brother    Arthritis Maternal Grandmother    Heart disease Maternal Grandfather    Lupus Paternal Grandmother    Heart attack Paternal Grandfather    Hypertension Sister    Chronic fatigue Daughter    Colon cancer Neg Hx    Esophageal cancer Neg Hx    Stomach cancer Neg Hx    Past Surgical History:  Procedure Laterality Date   APPENDECTOMY  1970   BREAST BIOPSY Left 08/2020   BREAST EXCISIONAL BIOPSY Left    CATARACT EXTRACTION     bilateral   CESAREAN SECTION  1976, 1977   x2   CHOLECYSTECTOMY  1988   TONSILLECTOMY AND ADENOIDECTOMY     TOTAL ABDOMINAL HYSTERECTOMY  2000   BSO due to fibroids  Social History   Social History Narrative   Youngest daughter died in [place].   Right handed    Lives in a two story home that has a basement   Immunization History  Administered Date(s) Administered   PFIZER(Purple Top)SARS-COV-2 Vaccination 08/29/2019, 09/26/2019     Objective: Vital Signs: LMP 07/03/1998 (Approximate)    Physical Exam   Musculoskeletal Exam: ***  CDAI Exam: CDAI Score: -- Patient Global: --; Provider Global: -- Swollen: --; Tender: -- Joint Exam 08/18/2021   No joint exam has been documented for this visit   There is currently no  information documented on the homunculus. Go to the Rheumatology activity and complete the homunculus joint exam.  Investigation: No additional findings.  Imaging: No results found.  Recent Labs: Lab Results  Component Value Date   WBC 7.9 01/13/2021   HGB 12.0 01/13/2021   PLT 244 01/13/2021   NA 141 01/13/2021   K 4.0 01/13/2021   CL 102 01/13/2021   CO2 23 01/13/2021   GLUCOSE 142 (H) 01/13/2021   BUN 16 01/13/2021   CREATININE 0.87 01/13/2021   BILITOT 0.4 01/13/2021   ALKPHOS 111 01/13/2021   AST 17 01/13/2021   ALT 6 01/13/2021   PROT 6.7 01/13/2021   ALBUMIN 4.1 01/13/2021   CALCIUM 9.3 01/13/2021   GFRAA 58 (L) 08/06/2020    Speciality Comments: PLQ Eye Exam: 01/07/2021 WNL. @ [facility] Follow up in 6 months.  Procedures:  No procedures performed Allergies: Clindamycin/lincomycin, Codeine, Demerol [meperidine], Erythromycin, Meperidine hcl, Ondansetron hcl, Penicillins, Sulfonamide derivatives, and Zofran [ondansetron hcl]   Assessment / Plan:     Visit Diagnoses: No diagnosis found.  Orders: No orders of the defined types were placed in this encounter.  No orders of the defined types were placed in this encounter.   Face-to-face time spent with patient was *** minutes. Greater than 50% of time was spent in counseling and coordination of care.  Follow-Up Instructions: No follow-ups on file.   Earnestine Mealing, CMA  Note - This record has been created using Editor, commissioning.  Chart creation errors have been sought, but may not always  have been located. Such creation errors do not reflect on  the standard of medical care.

## 2021-08-18 ENCOUNTER — Ambulatory Visit: Payer: Medicare Other | Admitting: Physician Assistant

## 2021-08-18 DIAGNOSIS — Z8639 Personal history of other endocrine, nutritional and metabolic disease: Secondary | ICD-10-CM

## 2021-08-18 DIAGNOSIS — K200 Eosinophilic esophagitis: Secondary | ICD-10-CM

## 2021-08-18 DIAGNOSIS — I10 Essential (primary) hypertension: Secondary | ICD-10-CM

## 2021-08-18 DIAGNOSIS — Z8669 Personal history of other diseases of the nervous system and sense organs: Secondary | ICD-10-CM

## 2021-08-18 DIAGNOSIS — Z8719 Personal history of other diseases of the digestive system: Secondary | ICD-10-CM

## 2021-08-18 DIAGNOSIS — G20 Parkinson's disease: Secondary | ICD-10-CM

## 2021-08-18 DIAGNOSIS — M3501 Sicca syndrome with keratoconjunctivitis: Secondary | ICD-10-CM

## 2021-08-18 DIAGNOSIS — M19041 Primary osteoarthritis, right hand: Secondary | ICD-10-CM

## 2021-08-18 DIAGNOSIS — M797 Fibromyalgia: Secondary | ICD-10-CM

## 2021-08-18 DIAGNOSIS — F419 Anxiety disorder, unspecified: Secondary | ICD-10-CM

## 2021-08-18 DIAGNOSIS — M170 Bilateral primary osteoarthritis of knee: Secondary | ICD-10-CM

## 2021-08-18 DIAGNOSIS — Z79899 Other long term (current) drug therapy: Secondary | ICD-10-CM

## 2021-08-18 DIAGNOSIS — Z87448 Personal history of other diseases of urinary system: Secondary | ICD-10-CM

## 2021-08-18 DIAGNOSIS — M810 Age-related osteoporosis without current pathological fracture: Secondary | ICD-10-CM

## 2021-08-18 DIAGNOSIS — Z87442 Personal history of urinary calculi: Secondary | ICD-10-CM

## 2021-08-18 DIAGNOSIS — M5136 Other intervertebral disc degeneration, lumbar region: Secondary | ICD-10-CM

## 2021-08-18 DIAGNOSIS — E785 Hyperlipidemia, unspecified: Secondary | ICD-10-CM

## 2021-08-18 DIAGNOSIS — M3219 Other organ or system involvement in systemic lupus erythematosus: Secondary | ICD-10-CM

## 2021-08-22 ENCOUNTER — Ambulatory Visit: Payer: Medicare Other | Admitting: Neurology

## 2021-08-22 NOTE — Progress Notes (Signed)
Assessment/Plan:   1.  Parkinsons Disease with family history of PD  -does not want genetic testing  -Increase carbidopa/levodopa 25/100 CR, 1.5 tablet 3 times per day (she takes at 10am/2pm/6pm).  2.  Depression  -Follows with primary care  3.  Dysphagia  -MBE on October 24, 2019 was normal, but speech therapist noted that solids remained in the mid to distal esophagus for prolonged period and GI referral was recommended.  Patient declined.    4.  Sjogren's, lupus, fibromyalgia             -Follows with Dr. Corliss Skains   Subjective:   Seward Speck was seen today in follow up for Parkinsons disease.  My previous records were reviewed prior to todays visit as well as outside records available to me. Pt denies falls but some near falls.  Pt denies lightheadedness, near syncope.  No hallucinations.  Mood has been good.  Not exercising - states that fatigue prevents her from doing that.  She bought some stationary bike pedals and has trouble getting them to stay on the floor.  Got covid after xmas but recovered now.    Current prescribed movement disorder medications: Continue carbidopa/levodopa 25/100 CR, 1 tablet 3 times daily    ALLERGIES:   Allergies  Allergen Reactions   Clindamycin/Lincomycin     hives   Codeine Itching   Demerol [Meperidine] Nausea And Vomiting   Erythromycin    Meperidine Hcl    Ondansetron Hcl    Penicillins    Sulfonamide Derivatives    Zofran [Ondansetron Hcl]     CURRENT MEDICATIONS:  Outpatient Encounter Medications as of 08/23/2021  Medication Sig   albuterol (VENTOLIN HFA) 108 (90 Base) MCG/ACT inhaler SMARTSIG:2 Inhalation Via Inhaler Every 4 Hours   amLODipine (NORVASC) 10 MG tablet Take 10 mg by mouth daily.   aspirin 81 MG tablet Take 81 mg by mouth daily.   brimonidine (ALPHAGAN P) 0.1 % SOLN    Calcium Carbonate-Vitamin D 600-400 MG-UNIT tablet Take 1 tablet by mouth daily.   Carbidopa-Levodopa ER (SINEMET CR) 25-100 MG tablet  controlled release TAKE 1 TABLET IN THE MORNING, AT NOON, AND AT BEDTIME   Coenzyme Q10 (COQ-10) 50 MG CAPS Take 1 capsule by mouth daily.   cycloSPORINE, PF, (CEQUA) 0.09 % SOLN    DIOVAN 320 MG tablet Take 320 mg by mouth daily.   escitalopram (LEXAPRO) 10 MG tablet Take 10 mg by mouth daily.    fluticasone (FLONASE) 50 MCG/ACT nasal spray SHAKE LQ AND U 2 SPRAYS IEN D   hydroxychloroquine (PLAQUENIL) 200 MG tablet TAKE 1 TABLET DAILY FOR SYSTEMIC LUPUS ERYTHEMATOSUS   levothyroxine (SYNTHROID, LEVOTHROID) 25 MCG tablet Take 25 mcg by mouth daily.   loratadine (CLARITIN) 10 MG tablet Take 10 mg by mouth daily.   losartan (COZAAR) 100 MG tablet Take 100 mg by mouth daily.   Omega-3 Fatty Acids (OMEGA 3 PO) Take by mouth daily.   pantoprazole (PROTONIX) 40 MG tablet Take 40 mg by mouth daily.    simvastatin (ZOCOR) 20 MG tablet Take 20 mg by mouth at bedtime.   vitamin B-12 (CYANOCOBALAMIN) 1000 MCG tablet Take 1,000 mcg by mouth daily.   No facility-administered encounter medications on file as of 08/23/2021.    Objective:   PHYSICAL EXAMINATION:    VITALS:   Vitals:   08/23/21 1527  BP: 131/66  Pulse: 66  SpO2: 95%  Weight: 124 lb 12.8 oz (56.6 kg)  Height: 4\' 11"  (  1.499 m)    GEN:  The patient appears stated age and is in NAD. HEENT:  Normocephalic, atraumatic.  The mucous membranes are moist. The superficial temporal arteries are without ropiness or tenderness. CV:  RRR Lungs:  CTAB Neck/HEME:  There are no carotid bruits bilaterally.  Neurological examination:  Orientation: The patient is alert and oriented x3. Cranial nerves: There is good facial symmetry with min facial hypomimia. The speech is fluent and clear. Soft palate rises symmetrically and there is no tongue deviation. Hearing is intact to conversational tone. Sensation: Sensation is intact to light touch throughout Motor: Strength is at least antigravity x4.  Movement examination: Tone: There is mild  increased tone in the LUE Abnormal movements: she has L foot tremor and rare L hand tremor Coordination:  There is no decremation with RAM's, with any form of RAMS, including alternating supination and pronation of the forearm, hand opening and closing, finger taps, heel taps and toe taps. Gait and Station: The patient has no difficulty arising out of a deep-seated chair without the use of the hands. The patient's stride length is slightly decreased and she slightly drags the L leg  I have reviewed and interpreted the following labs independently    Chemistry      Component Value Date/Time   NA 141 01/13/2021 1115   K 4.0 01/13/2021 1115   CL 102 01/13/2021 1115   CO2 23 01/13/2021 1115   BUN 16 01/13/2021 1115   CREATININE 0.87 01/13/2021 1115   CREATININE 1.09 (H) 08/06/2020 1417      Component Value Date/Time   CALCIUM 9.3 01/13/2021 1115   ALKPHOS 111 01/13/2021 1115   AST 17 01/13/2021 1115   ALT 6 01/13/2021 1115   BILITOT 0.4 01/13/2021 1115       Lab Results  Component Value Date   WBC 7.9 01/13/2021   HGB 12.0 01/13/2021   HCT 37.7 01/13/2021   MCV 91 01/13/2021   PLT 244 01/13/2021    Lab Results  Component Value Date   TSH 2.57 07/24/2018     Cc:  Cleatis Polka., MD

## 2021-08-23 ENCOUNTER — Ambulatory Visit (INDEPENDENT_AMBULATORY_CARE_PROVIDER_SITE_OTHER): Payer: Medicare Other | Admitting: Neurology

## 2021-08-23 ENCOUNTER — Other Ambulatory Visit: Payer: Self-pay

## 2021-08-23 ENCOUNTER — Encounter: Payer: Self-pay | Admitting: Neurology

## 2021-08-23 DIAGNOSIS — G20 Parkinson's disease: Secondary | ICD-10-CM | POA: Diagnosis not present

## 2021-08-23 MED ORDER — CARBIDOPA-LEVODOPA ER 25-100 MG PO TBCR: 1.5000 | EXTENDED_RELEASE_TABLET | Freq: Three times a day (TID) | ORAL | 3 refills | Status: DC

## 2021-08-23 NOTE — Patient Instructions (Signed)
Increase your carbidopa/levodopa 25/100 to 1.5 tablets at 10am/2pm/6pm

## 2021-08-23 NOTE — Progress Notes (Signed)
Office Visit Note  Patient: Veronica Jordan             Date of Birth: May 02, 1946           MRN: [id number]             PCP: Ginger Organ., MD Referring: Ginger Organ., MD Visit Date: 08/25/2021 Occupation: @GUAROCC @  Subjective:  Medication management  History of Present Illness: Veronica Jordan is a 76 y.o. female with history of systemic lupus erythematosus, nephritis and optic neuritis.  She continues to have dry mouth symptoms.  She has occasional oral ulcers.  She has noticed some rash around her eyes.  She denies any history of recent eye inflammation.  She continues to have some pain in her joints but no joint swelling.  She describes discomfort in her hands and her hip area which she describes over trochanteric region.  She continues to have lower back pain.  Fibromyalgia continues to be active with intermittent increased pain.  She also has Parkinson's and experiences night terrors and insomnia.  Patient states that she developed COVID-19 virus infection in December 2022.  She was treated with Paxlovid.  She had side effects from Paxlovid.  The symptoms eventually resolved.  Activities of Daily Living:  Patient reports morning stiffness for 20 minutes.   Patient Reports nocturnal pain.  Difficulty dressing/grooming: Denies Difficulty climbing stairs: Reports Difficulty getting out of chair: Reports Difficulty using hands for taps, buttons, cutlery, and/or writing: Reports  Review of Systems  Constitutional:  Positive for fatigue.  HENT:  Positive for mouth dryness and nose dryness. Negative for mouth sores.   Eyes:  Negative for pain, itching and dryness.  Respiratory:  Positive for shortness of breath. Negative for difficulty breathing.   Cardiovascular:  Negative for chest pain and palpitations.  Gastrointestinal:  Positive for constipation and diarrhea. Negative for blood in stool.  Endocrine: Negative for increased urination.  Genitourinary:  Negative for  difficulty urinating.  Musculoskeletal:  Positive for joint pain, joint pain, myalgias, morning stiffness, muscle tenderness and myalgias. Negative for joint swelling.  Skin:  Positive for rash. Negative for color change and sensitivity to sunlight.  Allergic/Immunologic: Negative for susceptible to infections.  Neurological:  Positive for numbness and weakness. Negative for dizziness, headaches and memory loss.  Hematological:  Positive for bruising/bleeding tendency.  Psychiatric/Behavioral:  Positive for sleep disturbance. Negative for depressed mood and confusion. The patient is not nervous/anxious.    PMFS History:  Patient Active Problem List   Diagnosis Date Noted   Sjogren's syndrome with keratoconjunctivitis sicca ([place]) 08/07/2018   History of nephritis 08/07/2018   History of optic neuritis 08/07/2018   Fibromyalgia 08/07/2018   Primary osteoarthritis of both hands 08/07/2018   Primary osteoarthritis of both knees 08/07/2018   DDD (degenerative disc disease), lumbar 40/98/1191   Eosinophilic esophagitis 47/82/9562   History of IBS 08/07/2018   History of gastroesophageal reflux (GERD) 08/07/2018   History of diverticulosis 08/07/2018   History of type 2 diabetes mellitus 08/07/2018   History of kidney stones 08/07/2018   History of peripheral neuropathy 08/07/2018   Dyslipidemia 08/07/2018   Anxiety and depression 08/07/2018   Essential tremor 08/07/2018   Postmenopausal hormone replacement therapy 03/05/2015   Osteoporosis 03/05/2015   Atrophic vaginitis 03/05/2015   HYPERTHYROIDISM 12/13/2007   Hypercholesterolemia 12/13/2007   Essential hypertension 12/13/2007   ALLERGIC RHINITIS 12/13/2007   LUPUS ERYTHEMATOSUS 12/13/2007   DYSPNEA 12/13/2007    Past Medical History:  Diagnosis Date   Allergic rhinitis    Anemia    Anxiety and depression    Depression    Diabetes mellitus    Diverticulitis    Fibromyalgia    GERD (gastroesophageal reflux disease)     History of gallstones    History of kidney stones    Hyperlipemia    Hypertension    Hypothyroidism    IBS (irritable bowel syndrome)    Osteoarthritis    Osteoporosis    Renal calculi    Sjogren's syndrome ([place])    Systemic lupus ([place]) 2001    Family History  Problem Relation Age of Onset   Asthma Mother    COPD Mother    Heart disease Father    Asthma Sister    Parkinsonism Other        MGM   Hypertension Brother    Heart attack Brother    Arthritis Maternal Grandmother    Heart disease Maternal Grandfather    Lupus Paternal Grandmother    Heart attack Paternal Grandfather    Hypertension Sister    Chronic fatigue Daughter    Colon cancer Neg Hx    Esophageal cancer Neg Hx    Stomach cancer Neg Hx    Past Surgical History:  Procedure Laterality Date   APPENDECTOMY  1970   BREAST BIOPSY Left 08/2020   BREAST EXCISIONAL BIOPSY Left    CATARACT EXTRACTION     bilateral   CESAREAN SECTION  1976, 1977   x2   CHOLECYSTECTOMY  1988   TONSILLECTOMY AND ADENOIDECTOMY     TOTAL ABDOMINAL HYSTERECTOMY  2000   BSO due to fibroids   Social History   Social History Narrative   Youngest daughter died in [place].   Right handed    Lives in a two story home that has a basement   Immunization History  Administered Date(s) Administered   PFIZER(Purple Top)SARS-COV-2 Vaccination 08/29/2019, 09/26/2019     Objective: Vital Signs: BP 117/72 (BP Location: Left Arm, Patient Position: Sitting, Cuff Size: Normal)    Pulse (!) 58    Ht 4' 10.5" (1.486 m)    Wt 124 lb 12.8 oz (56.6 kg)    LMP 07/03/1998 (Approximate)    BMI 25.64 kg/m    Physical Exam Vitals and nursing note reviewed.  Constitutional:      Appearance: She is well-developed.  HENT:     Head: Normocephalic and atraumatic.  Eyes:     Conjunctiva/sclera: Conjunctivae normal.  Cardiovascular:     Rate and Rhythm: Normal rate and regular rhythm.     Heart sounds: Normal heart sounds.  Pulmonary:     Effort:  Pulmonary effort is normal.     Breath sounds: Normal breath sounds.  Abdominal:     General: Bowel sounds are normal.     Palpations: Abdomen is soft.  Musculoskeletal:     Cervical back: Normal range of motion.  Lymphadenopathy:     Cervical: No cervical adenopathy.  Skin:    General: Skin is warm and dry.     Capillary Refill: Capillary refill takes less than 2 seconds.     Comments: Erythematous scaly rash was noted on the eyes.  Neurological:     Mental Status: She is alert and oriented to person, place, and time.  Psychiatric:        Behavior: Behavior normal.     Musculoskeletal Exam: C-spine was in good range of motion.  Shoulder joints, elbow joints, wrist joints, MCPs PIPs  and DIPs with good range of motion.  She had bilateral PIP and DIP thickening with no synovitis.  Hip joints and knee joints with good range of motion.  She had no tenderness over ankles or MTPs.  CDAI Exam: CDAI Score: -- Patient Global: --; Provider Global: -- Swollen: --; Tender: -- Joint Exam 08/25/2021   No joint exam has been documented for this visit   There is currently no information documented on the homunculus. Go to the Rheumatology activity and complete the homunculus joint exam.  Investigation: No additional findings.  Imaging: No results found.  Recent Labs: Lab Results  Component Value Date   WBC 7.9 01/13/2021   HGB 12.0 01/13/2021   PLT 244 01/13/2021   NA 141 01/13/2021   K 4.0 01/13/2021   CL 102 01/13/2021   CO2 23 01/13/2021   GLUCOSE 142 (H) 01/13/2021   BUN 16 01/13/2021   CREATININE 0.87 01/13/2021   BILITOT 0.4 01/13/2021   ALKPHOS 111 01/13/2021   AST 17 01/13/2021   ALT 6 01/13/2021   PROT 6.7 01/13/2021   ALBUMIN 4.1 01/13/2021   CALCIUM 9.3 01/13/2021   GFRAA 58 (L) 08/06/2020    Speciality Comments: PLQ Eye Exam: 01/07/2021 WNL. @ [facility] Follow up in 6 months.  Procedures:  No procedures performed Allergies: Clindamycin/lincomycin,  Codeine, Demerol [meperidine], Erythromycin, Iodinated contrast media, Meperidine hcl, Ondansetron hcl, Penicillins, Sulfonamide derivatives, and Zofran [ondansetron hcl]   Assessment / Plan:     Visit Diagnoses: Other systemic lupus erythematosus with other organ involvement (HCC) - Positive ANA, positive Ro, positive La, positive RF, elevated ESR, history of arthritis optic neuritis and nephritis:  -She has been doing well on hydroxychloroquine 1 tablet p.o. daily.  She continues to have some sicca symptoms.  She has occasional oral ulcers.  She has joint pain but no synovitis was noted.  Plan: Protein / creatinine ratio, urine, Anti-DNA antibody, double-stranded, C3 and C4, Sedimentation rate  High risk medication use - Plaquenil 200 mg 1 tablet by mouth daily. PLQ Eye Exam: 01/07/2021  - Plan: CBC with Differential/Platelet, COMPLETE METABOLIC PANEL WITH GFR today and then every 5 months.  History of optic neuritis - followed by Dr. Jerline Pain with [facility].  She has had no recurrence of symptoms.  History of nephritis - She has been in remission and sees her nephrologist at [facility] once yearly.   Sjogren's syndrome with keratoconjunctivitis sicca (HCC) - ANA+, Ro+, La+, positive RF: Over-the-counter products were discussed.  Primary osteoarthritis of both hands-she has severe osteoarthritis in her hands with bilateral PIP and DIP thickening.  Primary osteoarthritis of both knees-she had no warmth swelling or effusion in her knee joints.  DDD (degenerative disc disease), lumbar-she continues to have intermittent lower back pain.  Rash-she complains of rash around her eyes.  She has seen dermatologist and was diagnosed with atopic dermatitis.  She was given topical solution which causes burning.  She has been using over-the-counter hydrocortisone.  Age-related osteoporosis without current pathological fracture - DEXA ordered by PCP. DEXA normal on 05/26/14.  Patient will check her  next DEXA scan with her PCP.  According to patient her bone density has been normal.  Fibromyalgia-she continues to have some generalized pain and discomfort from fibromyalgia.  Parkinson's disease [facility]) - she is followed by Dr. Verdis Prime.  Patient states she has been experiencing night terrors.  She has insomnia due to night terrors.  Other medical problems are listed as follows:  History  of kidney stones  Anxiety and depression  Eosinophilic esophagitis  History of diverticulosis  History of type 2 diabetes mellitus  Essential hypertension  Dyslipidemia  History of gastroesophageal reflux (GERD)  History of IBS  COVID-19 virus infection - 12/22.  Patient was treated with Paxlovid.  She states she had a lot of side effects from Paxlovid which eventually resolved.  Orders: Orders Placed This Encounter  Procedures   Protein / creatinine ratio, urine   CBC with Differential/Platelet   COMPLETE METABOLIC PANEL WITH GFR   Anti-DNA antibody, double-stranded   C3 and C4   Sedimentation rate   No orders of the defined types were placed in this encounter.    Follow-Up Instructions: Return in about 5 months (around 01/22/2022) for Systemic lupus.   Bo Merino, MD  Note - This record has been created using Editor, commissioning.  Chart creation errors have been sought, but may not always  have been located. Such creation errors do not reflect on  the standard of medical care.

## 2021-08-25 ENCOUNTER — Ambulatory Visit (INDEPENDENT_AMBULATORY_CARE_PROVIDER_SITE_OTHER): Payer: Medicare Other | Admitting: Rheumatology

## 2021-08-25 ENCOUNTER — Encounter: Payer: Self-pay | Admitting: Rheumatology

## 2021-08-25 ENCOUNTER — Other Ambulatory Visit: Payer: Self-pay

## 2021-08-25 VITALS — BP 117/72 | HR 58 | Ht 58.5 in | Wt 124.8 lb

## 2021-08-25 DIAGNOSIS — G20A1 Parkinson's disease without dyskinesia, without mention of fluctuations: Secondary | ICD-10-CM

## 2021-08-25 DIAGNOSIS — Z8669 Personal history of other diseases of the nervous system and sense organs: Secondary | ICD-10-CM | POA: Diagnosis not present

## 2021-08-25 DIAGNOSIS — Z79899 Other long term (current) drug therapy: Secondary | ICD-10-CM | POA: Diagnosis not present

## 2021-08-25 DIAGNOSIS — E785 Hyperlipidemia, unspecified: Secondary | ICD-10-CM

## 2021-08-25 DIAGNOSIS — Z8639 Personal history of other endocrine, nutritional and metabolic disease: Secondary | ICD-10-CM

## 2021-08-25 DIAGNOSIS — K200 Eosinophilic esophagitis: Secondary | ICD-10-CM

## 2021-08-25 DIAGNOSIS — Z87442 Personal history of urinary calculi: Secondary | ICD-10-CM

## 2021-08-25 DIAGNOSIS — M797 Fibromyalgia: Secondary | ICD-10-CM

## 2021-08-25 DIAGNOSIS — U071 COVID-19: Secondary | ICD-10-CM

## 2021-08-25 DIAGNOSIS — I10 Essential (primary) hypertension: Secondary | ICD-10-CM

## 2021-08-25 DIAGNOSIS — F419 Anxiety disorder, unspecified: Secondary | ICD-10-CM

## 2021-08-25 DIAGNOSIS — Z8719 Personal history of other diseases of the digestive system: Secondary | ICD-10-CM

## 2021-08-25 DIAGNOSIS — G20 Parkinson's disease: Secondary | ICD-10-CM

## 2021-08-25 DIAGNOSIS — Z87448 Personal history of other diseases of urinary system: Secondary | ICD-10-CM | POA: Diagnosis not present

## 2021-08-25 DIAGNOSIS — M19041 Primary osteoarthritis, right hand: Secondary | ICD-10-CM

## 2021-08-25 DIAGNOSIS — M3219 Other organ or system involvement in systemic lupus erythematosus: Secondary | ICD-10-CM | POA: Diagnosis not present

## 2021-08-25 DIAGNOSIS — M3501 Sicca syndrome with keratoconjunctivitis: Secondary | ICD-10-CM | POA: Diagnosis not present

## 2021-08-25 DIAGNOSIS — F32A Depression, unspecified: Secondary | ICD-10-CM

## 2021-08-25 DIAGNOSIS — M810 Age-related osteoporosis without current pathological fracture: Secondary | ICD-10-CM | POA: Diagnosis not present

## 2021-08-25 DIAGNOSIS — M51369 Other intervertebral disc degeneration, lumbar region without mention of lumbar back pain or lower extremity pain: Secondary | ICD-10-CM

## 2021-08-25 DIAGNOSIS — R21 Rash and other nonspecific skin eruption: Secondary | ICD-10-CM

## 2021-08-25 DIAGNOSIS — M5136 Other intervertebral disc degeneration, lumbar region: Secondary | ICD-10-CM | POA: Diagnosis not present

## 2021-08-25 DIAGNOSIS — M170 Bilateral primary osteoarthritis of knee: Secondary | ICD-10-CM | POA: Diagnosis not present

## 2021-08-25 DIAGNOSIS — M19042 Primary osteoarthritis, left hand: Secondary | ICD-10-CM

## 2021-08-26 NOTE — Progress Notes (Signed)
UA negative, hemoglobin is low at 11.5, glucose mildly elevated at 128, potassium is low.  Please have patient discuss low potassium with her PCP.  Complements are normal, sed rate is normal, double-stranded DNA is pending.

## 2021-08-29 LAB — CBC WITH DIFFERENTIAL/PLATELET
Absolute Monocytes: 420 cells/uL (ref 200–950)
Basophils Absolute: 70 cells/uL (ref 0–200)
Basophils Relative: 1.4 %
Eosinophils Absolute: 220 cells/uL (ref 15–500)
Eosinophils Relative: 4.4 %
HCT: 35.5 % (ref 35.0–45.0)
Hemoglobin: 11.5 g/dL — ABNORMAL LOW (ref 11.7–15.5)
Lymphs Abs: 1180 cells/uL (ref 850–3900)
MCH: 28.5 pg (ref 27.0–33.0)
MCHC: 32.4 g/dL (ref 32.0–36.0)
MCV: 87.9 fL (ref 80.0–100.0)
MPV: 11.2 fL (ref 7.5–12.5)
Monocytes Relative: 8.4 %
Neutro Abs: 3110 cells/uL (ref 1500–7800)
Neutrophils Relative %: 62.2 %
Platelets: 211 10*3/uL (ref 140–400)
RBC: 4.04 10*6/uL (ref 3.80–5.10)
RDW: 12.7 % (ref 11.0–15.0)
Total Lymphocyte: 23.6 %
WBC: 5 10*3/uL (ref 3.8–10.8)

## 2021-08-29 LAB — COMPLETE METABOLIC PANEL WITH GFR
AG Ratio: 1.5 (calc) (ref 1.0–2.5)
ALT: 5 U/L — ABNORMAL LOW (ref 6–29)
AST: 14 U/L (ref 10–35)
Albumin: 4.4 g/dL (ref 3.6–5.1)
Alkaline phosphatase (APISO): 78 U/L (ref 37–153)
BUN: 14 mg/dL (ref 7–25)
CO2: 24 mmol/L (ref 20–32)
Calcium: 9.4 mg/dL (ref 8.6–10.4)
Chloride: 105 mmol/L (ref 98–110)
Creat: 1 mg/dL (ref 0.60–1.00)
Globulin: 3 g/dL (calc) (ref 1.9–3.7)
Glucose, Bld: 128 mg/dL — ABNORMAL HIGH (ref 65–99)
Potassium: 3.4 mmol/L — ABNORMAL LOW (ref 3.5–5.3)
Sodium: 140 mmol/L (ref 135–146)
Total Bilirubin: 0.5 mg/dL (ref 0.2–1.2)
Total Protein: 7.4 g/dL (ref 6.1–8.1)
eGFR: 59 mL/min/{1.73_m2} — ABNORMAL LOW (ref >=60)

## 2021-08-29 LAB — C3 AND C4
C3 Complement: 157 mg/dL (ref 83–193)
C4 Complement: 21 mg/dL (ref 15–57)

## 2021-08-29 LAB — PROTEIN / CREATININE RATIO, URINE
Creatinine, Urine: 269 mg/dL (ref 20–275)
Protein/Creat Ratio: 115 mg/g creat (ref 24–184)
Protein/Creatinine Ratio: 0.115 mg/mg creat (ref 0.024–0.184)
Total Protein, Urine: 31 mg/dL — ABNORMAL HIGH (ref 5–24)

## 2021-08-29 LAB — ANTI-DNA ANTIBODY, DOUBLE-STRANDED: ds DNA Ab: 22 IU/mL — ABNORMAL HIGH

## 2021-08-29 LAB — SEDIMENTATION RATE: Sed Rate: 29 mm/h (ref 0–30)

## 2021-08-29 NOTE — Progress Notes (Signed)
Double-stranded DNA is elevated.  Patient was asymptomatic.  Please have patient repeat sed rate, C3-C4 and dsDNA in 1 month.  Please advise patient to contact us if she develops any new symptoms.

## 2021-08-30 ENCOUNTER — Other Ambulatory Visit: Payer: Self-pay | Admitting: *Deleted

## 2021-08-30 DIAGNOSIS — Z79899 Other long term (current) drug therapy: Secondary | ICD-10-CM

## 2021-08-30 DIAGNOSIS — M3501 Sicca syndrome with keratoconjunctivitis: Secondary | ICD-10-CM

## 2021-08-30 DIAGNOSIS — M3219 Other organ or system involvement in systemic lupus erythematosus: Secondary | ICD-10-CM

## 2021-08-30 DIAGNOSIS — M19041 Primary osteoarthritis, right hand: Secondary | ICD-10-CM

## 2021-09-07 ENCOUNTER — Other Ambulatory Visit: Payer: Self-pay | Admitting: Physician Assistant

## 2021-09-07 DIAGNOSIS — M3219 Other organ or system involvement in systemic lupus erythematosus: Secondary | ICD-10-CM

## 2021-09-07 NOTE — Telephone Encounter (Signed)
Next Visit: 01/23/2022 ? ?Last Visit: 08/25/2021 ? ?Labs: 08/25/2021 hemoglobin is low at 11.5, glucose mildly elevated at 128, potassium is low. ? ?Eye exam: 01/07/2021 WNL  ? ?Current Dose per office note 08/25/2021: Plaquenil 200 mg 1 tablet by mouth daily ? ?OE:VOJJK systemic lupus erythematosus with other organ involvement  ? ?Last Fill: 06/09/2021 ? ?Okay to refill Plaquenil?  ?

## 2021-09-12 ENCOUNTER — Ambulatory Visit
Admission: RE | Admit: 2021-09-12 | Discharge: 2021-09-12 | Disposition: A | Discharge status: Home / Self Care | Payer: Medicare Other | Source: Ambulatory Visit | Attending: Surgery | Admitting: Surgery

## 2021-09-12 DIAGNOSIS — R922 Inconclusive mammogram: Secondary | ICD-10-CM | POA: Diagnosis not present

## 2021-09-12 DIAGNOSIS — N6489 Other specified disorders of breast: Secondary | ICD-10-CM

## 2021-09-26 ENCOUNTER — Other Ambulatory Visit: Payer: Self-pay | Admitting: *Deleted

## 2021-09-26 DIAGNOSIS — M3219 Other organ or system involvement in systemic lupus erythematosus: Secondary | ICD-10-CM

## 2021-09-26 DIAGNOSIS — E041 Nontoxic single thyroid nodule: Secondary | ICD-10-CM | POA: Diagnosis not present

## 2021-09-26 DIAGNOSIS — E1149 Type 2 diabetes mellitus with other diabetic neurological complication: Secondary | ICD-10-CM | POA: Diagnosis not present

## 2021-09-26 DIAGNOSIS — Z79899 Other long term (current) drug therapy: Secondary | ICD-10-CM

## 2021-09-26 DIAGNOSIS — E785 Hyperlipidemia, unspecified: Secondary | ICD-10-CM | POA: Diagnosis not present

## 2021-09-26 DIAGNOSIS — M810 Age-related osteoporosis without current pathological fracture: Secondary | ICD-10-CM | POA: Diagnosis not present

## 2021-09-26 DIAGNOSIS — N1831 Chronic kidney disease, stage 3a: Secondary | ICD-10-CM | POA: Diagnosis not present

## 2021-09-26 DIAGNOSIS — D649 Anemia, unspecified: Secondary | ICD-10-CM | POA: Diagnosis not present

## 2021-09-26 DIAGNOSIS — M3210 Systemic lupus erythematosus, organ or system involvement unspecified: Secondary | ICD-10-CM | POA: Diagnosis not present

## 2021-09-26 DIAGNOSIS — G20 Parkinson's disease: Secondary | ICD-10-CM | POA: Diagnosis not present

## 2021-09-26 DIAGNOSIS — I129 Hypertensive chronic kidney disease with stage 1 through stage 4 chronic kidney disease, or unspecified chronic kidney disease: Secondary | ICD-10-CM | POA: Diagnosis not present

## 2021-09-26 DIAGNOSIS — K649 Unspecified hemorrhoids: Secondary | ICD-10-CM | POA: Diagnosis not present

## 2021-09-26 DIAGNOSIS — E1129 Type 2 diabetes mellitus with other diabetic kidney complication: Secondary | ICD-10-CM | POA: Diagnosis not present

## 2021-09-26 DIAGNOSIS — F339 Major depressive disorder, recurrent, unspecified: Secondary | ICD-10-CM | POA: Diagnosis not present

## 2021-09-29 LAB — CBC WITH DIFFERENTIAL/PLATELET
Absolute Monocytes: 518 cells/uL (ref 200–950)
Basophils Absolute: 70 cells/uL (ref 0–200)
Basophils Relative: 1.3 %
Eosinophils Absolute: 178 cells/uL (ref 15–500)
Eosinophils Relative: 3.3 %
HCT: 35.3 % (ref 35.0–45.0)
Hemoglobin: 11.6 g/dL — ABNORMAL LOW (ref 11.7–15.5)
Lymphs Abs: 1393 cells/uL (ref 850–3900)
MCH: 29.2 pg (ref 27.0–33.0)
MCHC: 32.9 g/dL (ref 32.0–36.0)
MCV: 88.9 fL (ref 80.0–100.0)
MPV: 11.2 fL (ref 7.5–12.5)
Monocytes Relative: 9.6 %
Neutro Abs: 3240 cells/uL (ref 1500–7800)
Neutrophils Relative %: 60 %
Platelets: 210 10*3/uL (ref 140–400)
RBC: 3.97 10*6/uL (ref 3.80–5.10)
RDW: 12.7 % (ref 11.0–15.0)
Total Lymphocyte: 25.8 %
WBC: 5.4 10*3/uL (ref 3.8–10.8)

## 2021-09-29 LAB — PROTEIN / CREATININE RATIO, URINE
Creatinine, Urine: 185 mg/dL (ref 20–275)
Protein/Creat Ratio: 103 mg/g creat (ref 24–184)
Protein/Creatinine Ratio: 0.103 mg/mg creat (ref 0.024–0.184)
Total Protein, Urine: 19 mg/dL (ref 5–24)

## 2021-09-29 LAB — COMPLETE METABOLIC PANEL WITH GFR
AG Ratio: 1.6 (calc) (ref 1.0–2.5)
ALT: 9 U/L (ref 6–29)
AST: 15 U/L (ref 10–35)
Albumin: 4.6 g/dL (ref 3.6–5.1)
Alkaline phosphatase (APISO): 82 U/L (ref 37–153)
BUN: 12 mg/dL (ref 7–25)
CO2: 26 mmol/L (ref 20–32)
Calcium: 9.4 mg/dL (ref 8.6–10.4)
Chloride: 103 mmol/L (ref 98–110)
Creat: 0.88 mg/dL (ref 0.60–1.00)
Globulin: 2.9 g/dL (calc) (ref 1.9–3.7)
Glucose, Bld: 80 mg/dL (ref 65–99)
Potassium: 3.6 mmol/L (ref 3.5–5.3)
Sodium: 139 mmol/L (ref 135–146)
Total Bilirubin: 0.4 mg/dL (ref 0.2–1.2)
Total Protein: 7.5 g/dL (ref 6.1–8.1)
eGFR: 69 mL/min/{1.73_m2} (ref >=60)

## 2021-09-29 LAB — ANTI-DNA ANTIBODY, DOUBLE-STRANDED: ds DNA Ab: 20 IU/mL — ABNORMAL HIGH

## 2021-09-29 LAB — C3 AND C4
C3 Complement: 158 mg/dL (ref 83–193)
C4 Complement: 20 mg/dL (ref 15–57)

## 2021-09-29 LAB — HYDROXYCHLOROQUINE,BLOOD: HYDROXYCHLOROQUINE, (B): 750 ng/mL — ABNORMAL HIGH

## 2021-09-29 LAB — SEDIMENTATION RATE: Sed Rate: 25 mm/h (ref 0–30)

## 2021-09-30 NOTE — Progress Notes (Signed)
Double-stranded DNA is a stable.  Hydroxychloroquine level is slightly below the therapeutic range.  She should take hydroxychloroquine on a regular basis.  We will recheck labs at the follow-up visit.  We will adjust the dose of hydroxychloroquine if needed.  She should contact us in case she develops any new symptoms.

## 2021-10-14 ENCOUNTER — Other Ambulatory Visit: Payer: Self-pay

## 2021-10-14 ENCOUNTER — Telehealth: Payer: Self-pay | Admitting: Neurology

## 2021-10-14 DIAGNOSIS — G20A1 Parkinson's disease without dyskinesia, without mention of fluctuations: Secondary | ICD-10-CM

## 2021-10-14 DIAGNOSIS — G20 Parkinson's disease: Secondary | ICD-10-CM

## 2021-10-14 MED ORDER — CARBIDOPA-LEVODOPA ER 25-100 MG PO TBCR: 1.5000 | EXTENDED_RELEASE_TABLET | Freq: Three times a day (TID) | ORAL | 0 refills | Status: DC

## 2021-10-14 NOTE — Telephone Encounter (Signed)
Patient called for refills on her carbidopa levodopa.  ? ?She had an increase and reports she is doing well on it just a little more sleepy. ? ?[facility], 90 days worth ? ?Patient aware Dr. Arbutus Leas is on vacation. ?

## 2021-10-14 NOTE — Telephone Encounter (Signed)
Prescription has [facility] and I sent a mychart message to patient that it has been sent in for her   ?

## 2021-10-26 DIAGNOSIS — H40023 Open angle with borderline findings, high risk, bilateral: Secondary | ICD-10-CM | POA: Diagnosis not present

## 2021-10-26 DIAGNOSIS — H35341 Macular cyst, hole, or pseudohole, right eye: Secondary | ICD-10-CM | POA: Diagnosis not present

## 2021-10-26 DIAGNOSIS — H53452 Other localized visual field defect, left eye: Secondary | ICD-10-CM | POA: Diagnosis not present

## 2021-10-26 DIAGNOSIS — Z79899 Other long term (current) drug therapy: Secondary | ICD-10-CM | POA: Diagnosis not present

## 2021-11-23 DIAGNOSIS — E119 Type 2 diabetes mellitus without complications: Secondary | ICD-10-CM | POA: Diagnosis not present

## 2021-11-23 DIAGNOSIS — H53452 Other localized visual field defect, left eye: Secondary | ICD-10-CM | POA: Diagnosis not present

## 2021-11-23 DIAGNOSIS — H40023 Open angle with borderline findings, high risk, bilateral: Secondary | ICD-10-CM | POA: Diagnosis not present

## 2021-11-23 DIAGNOSIS — Z79899 Other long term (current) drug therapy: Secondary | ICD-10-CM | POA: Diagnosis not present

## 2021-11-23 LAB — HM DIABETES EYE EXAM

## 2021-12-30 ENCOUNTER — Other Ambulatory Visit: Payer: Self-pay | Admitting: Physician Assistant

## 2021-12-30 DIAGNOSIS — M3219 Other organ or system involvement in systemic lupus erythematosus: Secondary | ICD-10-CM

## 2022-01-02 NOTE — Telephone Encounter (Signed)
Next Visit: 01/23/2022  Last Visit: 08/25/2021  Labs: 09/22/2021, Hemoglobin is borderline low-11.6. rest of CBC WNL.  CMP WNL. ESR WNL.  Complements WNL.  Protein creatinine ratio WNL. Double-stranded DNA is a stable.  Hydroxychloroquine level is slightly below the therapeutic range.  She should take hydroxychloroquine on a regular basis.  We will recheck labs at the follow-up visit.  We will adjust the dose of hydroxychloroquine if needed.  She should contact us in case she develops any new symptoms.  Eye exam: 11/23/2021   Current Dose per office note 08/25/2021:  Plaquenil 200 mg 1 tablet by mouth daily  DX: Other systemic lupus erythematosus with other organ involvement   Last Fill: 09/07/2021  Okay to refill Plaquenil?

## 2022-01-09 NOTE — Progress Notes (Deleted)
Office Visit Note  Patient: Veronica Jordan             Date of Birth: 11-Jul-1945           MRN: [id number]             PCP: Ginger Organ., MD Referring: Ginger Organ., MD Visit Date: 01/23/2022 Occupation: _0 @  Subjective:    History of Present Illness: Veronica Jordan is a 76 y.o. female with history of systemic lupus erythematosus, osteoarthritis, and sjogren's syndrome.  She is taking plaquenil 200 mg 1 tablet by mouth daily.   Lab work from 09/26/21 were reviewed today in the office: protein creatinine ratio WNL, CMP WNL, hgb 11.6, WBC count 5.4, dsDNA 20-stable, complements WNL, ESR WNL, and hydroxychloroquine 750.  The following lab work will be rechecked today.  We will dose adjust plaquenil if she remains within the slightly subtherapeutic range. She was advised to notify us if she develops any signs or symptoms of a flare.  She will follow up in 5 months or sooner if needed.    CBC and CMP updated on 09/26/21. Orders for CBC and CMP released today.  PLQ Eye Exam: 11/23/2021 WNL @ [facility].    Activities of Daily Living:  Patient reports morning stiffness for *** {minute/hour:19697}.   Patient {ACTIONS;DENIES/REPORTS:21021675::"Denies"} nocturnal pain.  Difficulty dressing/grooming: {ACTIONS;DENIES/REPORTS:21021675::"Denies"} Difficulty climbing stairs: {ACTIONS;DENIES/REPORTS:21021675::"Denies"} Difficulty getting out of chair: {ACTIONS;DENIES/REPORTS:21021675::"Denies"} Difficulty using hands for taps, buttons, cutlery, and/or writing: {ACTIONS;DENIES/REPORTS:21021675::"Denies"}  No Rheumatology ROS completed.   PMFS History:  Patient Active Problem List   Diagnosis Date Noted   Sjogren's syndrome with keratoconjunctivitis sicca ([place]) 08/07/2018   History of nephritis 08/07/2018   History of optic neuritis 08/07/2018   Fibromyalgia 08/07/2018   Primary osteoarthritis of both hands 08/07/2018   Primary osteoarthritis of both knees  08/07/2018   DDD (degenerative disc disease), lumbar 03/88/8280   Eosinophilic esophagitis 03/49/1791   History of IBS 08/07/2018   History of gastroesophageal reflux (GERD) 08/07/2018   History of diverticulosis 08/07/2018   History of type 2 diabetes mellitus 08/07/2018   History of kidney stones 08/07/2018   History of peripheral neuropathy 08/07/2018   Dyslipidemia 08/07/2018   Anxiety and depression 08/07/2018   Essential tremor 08/07/2018   Postmenopausal hormone replacement therapy 03/05/2015   Osteoporosis 03/05/2015   Atrophic vaginitis 03/05/2015   HYPERTHYROIDISM 12/13/2007   Hypercholesterolemia 12/13/2007   Essential hypertension 12/13/2007   ALLERGIC RHINITIS 12/13/2007   LUPUS ERYTHEMATOSUS 12/13/2007   DYSPNEA 12/13/2007    Past Medical History:  Diagnosis Date   Allergic rhinitis    Anemia    Anxiety and depression    Depression    Diabetes mellitus    Diverticulitis    Fibromyalgia    GERD (gastroesophageal reflux disease)    History of gallstones    History of kidney stones    Hyperlipemia    Hypertension    Hypothyroidism    IBS (irritable bowel syndrome)    Osteoarthritis    Osteoporosis    Renal calculi    Sjogren's syndrome ([place])    Systemic lupus ([place]) 2001    Family History  Problem Relation Age of Onset   Asthma Mother    COPD Mother    Heart disease Father    Asthma Sister    Parkinsonism Other        MGM   Hypertension Brother    Heart attack Brother    Arthritis Maternal Grandmother  Heart disease Maternal Grandfather    Lupus Paternal Grandmother    Heart attack Paternal Grandfather    Hypertension Sister    Chronic fatigue Daughter    Colon cancer Neg Hx    Esophageal cancer Neg Hx    Stomach cancer Neg Hx    Past Surgical History:  Procedure Laterality Date   APPENDECTOMY  1970   BREAST BIOPSY Left 08/2020   BREAST EXCISIONAL BIOPSY Left    CATARACT EXTRACTION     bilateral   CESAREAN SECTION  1976, 1977   x2    CHOLECYSTECTOMY  1988   TONSILLECTOMY AND ADENOIDECTOMY     TOTAL ABDOMINAL HYSTERECTOMY  2000   BSO due to fibroids   Social History   Social History Narrative   Youngest daughter died in .   Right handed    Lives in a two story home that has a basement   Immunization History  Administered Date(s) Administered   PFIZER(Purple Top)SARS-COV-2 Vaccination 08/29/2019, 09/26/2019     Objective: Vital Signs: LMP 07/03/1998 (Approximate)    Physical Exam Vitals and nursing note reviewed.  Constitutional:      Appearance: She is well-developed.  HENT:     Head: Normocephalic and atraumatic.  Eyes:     Conjunctiva/sclera: Conjunctivae normal.  Cardiovascular:     Rate and Rhythm: Normal rate and regular rhythm.     Heart sounds: Normal heart sounds.  Pulmonary:     Effort: Pulmonary effort is normal.     Breath sounds: Normal breath sounds.  Abdominal:     General: Bowel sounds are normal.     Palpations: Abdomen is soft.  Musculoskeletal:     Cervical back: Normal range of motion.  Skin:    General: Skin is warm and dry.     Capillary Refill: Capillary refill takes less than 2 seconds.  Neurological:     Mental Status: She is alert and oriented to person, place, and time.  Psychiatric:        Behavior: Behavior normal.      Musculoskeletal Exam: ***  CDAI Exam: CDAI Score: -- Patient Global: --; Provider Global: -- Swollen: --; Tender: -- Joint Exam 01/23/2022   No joint exam has been documented for this visit   There is currently no information documented on the homunculus. Go to the Rheumatology activity and complete the homunculus joint exam.  Investigation: No additional findings.  Imaging: No results found.  Recent Labs: Lab Results  Component Value Date   WBC 5.4 09/26/2021   HGB 11.6 (L) 09/26/2021   PLT 210 09/26/2021   NA 139 09/26/2021   K 3.6 09/26/2021   CL 103 09/26/2021   CO2 26 09/26/2021   GLUCOSE 80 09/26/2021   BUN 12  09/26/2021   CREATININE 0.88 09/26/2021   BILITOT 0.4 09/26/2021   ALKPHOS 111 01/13/2021   AST 15 09/26/2021   ALT 9 09/26/2021   PROT 7.5 09/26/2021   ALBUMIN 4.1 01/13/2021   CALCIUM 9.4 09/26/2021   GFRAA 58 (L) 08/06/2020    Speciality Comments: PLQ Eye Exam: 11/23/2021 WNL @ [facility] Follow up in 3-4 months.  Procedures:  No procedures performed Allergies: Clindamycin/lincomycin, Codeine, Demerol [meperidine], Erythromycin, Iodinated contrast media, Meperidine hcl, Ondansetron hcl, Penicillins, Sulfonamide derivatives, and Zofran [ondansetron hcl]   Assessment / Plan:     Visit Diagnoses: No diagnosis found.  Orders: No orders of the defined types were placed in this encounter.  No orders of the defined types were  placed in this encounter.   Face-to-face time spent with patient was *** minutes. Greater than 50% of time was spent in counseling and coordination of care.  Follow-Up Instructions: No follow-ups on file.   Earnestine Mealing, CMA  Note - This record has been created using Editor, commissioning.  Chart creation errors have been sought, but may not always  have been located. Such creation errors do not reflect on  the standard of medical care.

## 2022-01-16 ENCOUNTER — Other Ambulatory Visit: Payer: Self-pay

## 2022-01-16 DIAGNOSIS — G20 Parkinson's disease: Secondary | ICD-10-CM

## 2022-01-16 MED ORDER — CARBIDOPA-LEVODOPA ER 25-100 MG PO TBCR: 1.5000 | EXTENDED_RELEASE_TABLET | Freq: Three times a day (TID) | ORAL | 0 refills | Status: DC

## 2022-01-23 ENCOUNTER — Ambulatory Visit: Payer: Medicare Other | Admitting: Physician Assistant

## 2022-01-23 DIAGNOSIS — K200 Eosinophilic esophagitis: Secondary | ICD-10-CM

## 2022-01-23 DIAGNOSIS — Z8639 Personal history of other endocrine, nutritional and metabolic disease: Secondary | ICD-10-CM

## 2022-01-23 DIAGNOSIS — M19042 Primary osteoarthritis, left hand: Secondary | ICD-10-CM

## 2022-01-23 DIAGNOSIS — F32A Depression, unspecified: Secondary | ICD-10-CM

## 2022-01-23 DIAGNOSIS — Z87442 Personal history of urinary calculi: Secondary | ICD-10-CM

## 2022-01-23 DIAGNOSIS — R21 Rash and other nonspecific skin eruption: Secondary | ICD-10-CM

## 2022-01-23 DIAGNOSIS — I10 Essential (primary) hypertension: Secondary | ICD-10-CM

## 2022-01-23 DIAGNOSIS — M810 Age-related osteoporosis without current pathological fracture: Secondary | ICD-10-CM

## 2022-01-23 DIAGNOSIS — E785 Hyperlipidemia, unspecified: Secondary | ICD-10-CM

## 2022-01-23 DIAGNOSIS — Z87448 Personal history of other diseases of urinary system: Secondary | ICD-10-CM

## 2022-01-23 DIAGNOSIS — U071 COVID-19: Secondary | ICD-10-CM

## 2022-01-23 DIAGNOSIS — M3219 Other organ or system involvement in systemic lupus erythematosus: Secondary | ICD-10-CM

## 2022-01-23 DIAGNOSIS — Z8669 Personal history of other diseases of the nervous system and sense organs: Secondary | ICD-10-CM

## 2022-01-23 DIAGNOSIS — M5136 Other intervertebral disc degeneration, lumbar region: Secondary | ICD-10-CM

## 2022-01-23 DIAGNOSIS — Z79899 Other long term (current) drug therapy: Secondary | ICD-10-CM

## 2022-01-23 DIAGNOSIS — G20 Parkinson's disease: Secondary | ICD-10-CM

## 2022-01-23 DIAGNOSIS — M170 Bilateral primary osteoarthritis of knee: Secondary | ICD-10-CM

## 2022-01-23 DIAGNOSIS — M3501 Sicca syndrome with keratoconjunctivitis: Secondary | ICD-10-CM

## 2022-01-23 DIAGNOSIS — Z8719 Personal history of other diseases of the digestive system: Secondary | ICD-10-CM

## 2022-01-23 DIAGNOSIS — M797 Fibromyalgia: Secondary | ICD-10-CM

## 2022-01-23 NOTE — Progress Notes (Unsigned)
Office Visit Note  Patient: Veronica Jordan             Date of Birth: [birth year]           MRN: [id number]             PCP: Ginger Organ., MD Referring: Ginger Organ., MD Visit Date: 01/25/2022 Occupation: _0 @  Subjective:  No chief complaint on file.   History of Present Illness: Veronica Jordan is a 76 y.o. female with history of systemic lupus erythematosus, osteoarthritis, and sjogren's syndrome.  She is taking plaquenil 200 mg 1 tablet by mouth daily.    Lab work from 09/26/21 were reviewed today in the office: protein creatinine ratio WNL, CMP WNL, hgb 11.6, WBC count 5.4, dsDNA 20-stable, complements WNL, ESR WNL, and hydroxychloroquine 750.  The following lab work will be rechecked today.  We will dose adjust plaquenil if she remains within the slightly subtherapeutic range. She was advised to notify us if she develops any signs or symptoms of a flare.  She will follow up in 5 months or sooner if needed.     CBC and CMP updated on 09/26/21. Orders for CBC and CMP released today.  PLQ Eye Exam: 11/23/2021 WNL @ [facility].     Activities of Daily Living:  Patient reports morning stiffness for *** {minute/hour:19697}.   Patient {ACTIONS;DENIES/REPORTS:21021675::"Denies"} nocturnal pain.  Difficulty dressing/grooming: {ACTIONS;DENIES/REPORTS:21021675::"Denies"} Difficulty climbing stairs: {ACTIONS;DENIES/REPORTS:21021675::"Denies"} Difficulty getting out of chair: {ACTIONS;DENIES/REPORTS:21021675::"Denies"} Difficulty using hands for taps, buttons, cutlery, and/or writing: {ACTIONS;DENIES/REPORTS:21021675::"Denies"}  No Rheumatology ROS completed.   PMFS History:  Patient Active Problem List   Diagnosis Date Noted   Sjogren's syndrome with keratoconjunctivitis sicca ([place]) 08/07/2018   History of nephritis 08/07/2018   History of optic neuritis 08/07/2018   Fibromyalgia 08/07/2018   Primary osteoarthritis of both hands 08/07/2018   Primary  osteoarthritis of both knees 08/07/2018   DDD (degenerative disc disease), lumbar 56/31/4970   Eosinophilic esophagitis 26/37/8588   History of IBS 08/07/2018   History of gastroesophageal reflux (GERD) 08/07/2018   History of diverticulosis 08/07/2018   History of type 2 diabetes mellitus 08/07/2018   History of kidney stones 08/07/2018   History of peripheral neuropathy 08/07/2018   Dyslipidemia 08/07/2018   Anxiety and depression 08/07/2018   Essential tremor 08/07/2018   Postmenopausal hormone replacement therapy 03/05/2015   Osteoporosis 03/05/2015   Atrophic vaginitis 03/05/2015   HYPERTHYROIDISM 12/13/2007   Hypercholesterolemia 12/13/2007   Essential hypertension 12/13/2007   ALLERGIC RHINITIS 12/13/2007   LUPUS ERYTHEMATOSUS 12/13/2007   DYSPNEA 12/13/2007    Past Medical History:  Diagnosis Date   Allergic rhinitis    Anemia    Anxiety and depression    Depression    Diabetes mellitus    Diverticulitis    Fibromyalgia    GERD (gastroesophageal reflux disease)    History of gallstones    History of kidney stones    Hyperlipemia    Hypertension    Hypothyroidism    IBS (irritable bowel syndrome)    Osteoarthritis    Osteoporosis    Renal calculi    Sjogren's syndrome ([place])    Systemic lupus ([place]) 2001    Family History  Problem Relation Age of Onset   Asthma Mother    COPD Mother    Heart disease Father    Asthma Sister    Parkinsonism Other        MGM   Hypertension Brother    Heart  attack Brother    Arthritis Maternal Grandmother    Heart disease Maternal Grandfather    Lupus Paternal Grandmother    Heart attack Paternal Grandfather    Hypertension Sister    Chronic fatigue Daughter    Colon cancer Neg Hx    Esophageal cancer Neg Hx    Stomach cancer Neg Hx    Past Surgical History:  Procedure Laterality Date   APPENDECTOMY  1970   BREAST BIOPSY Left 08/2020   BREAST EXCISIONAL BIOPSY Left    CATARACT EXTRACTION     bilateral   CESAREAN  SECTION  1976, 1977   x2   CHOLECYSTECTOMY  1988   TONSILLECTOMY AND ADENOIDECTOMY     TOTAL ABDOMINAL HYSTERECTOMY  2000   BSO due to fibroids   Social History   Social History Narrative   Youngest daughter died in [place].   Right handed    Lives in a two story home that has a basement   Immunization History  Administered Date(s) Administered   PFIZER(Purple Top)SARS-COV-2 Vaccination 08/29/2019, 09/26/2019     Objective: Vital Signs: LMP 07/03/1998 (Approximate)    Physical Exam   Musculoskeletal Exam: ***  CDAI Exam: CDAI Score: -- Patient Global: --; Provider Global: -- Swollen: --; Tender: -- Joint Exam 01/25/2022   No joint exam has been documented for this visit   There is currently no information documented on the homunculus. Go to the Rheumatology activity and complete the homunculus joint exam.  Investigation: No additional findings.  Imaging: No results found.  Recent Labs: Lab Results  Component Value Date   WBC 5.4 09/26/2021   HGB 11.6 (L) 09/26/2021   PLT 210 09/26/2021   NA 139 09/26/2021   K 3.6 09/26/2021   CL 103 09/26/2021   CO2 26 09/26/2021   GLUCOSE 80 09/26/2021   BUN 12 09/26/2021   CREATININE 0.88 09/26/2021   BILITOT 0.4 09/26/2021   ALKPHOS 111 01/13/2021   AST 15 09/26/2021   ALT 9 09/26/2021   PROT 7.5 09/26/2021   ALBUMIN 4.1 01/13/2021   CALCIUM 9.4 09/26/2021   GFRAA 58 (L) 08/06/2020    Speciality Comments: PLQ Eye Exam: 11/23/2021 WNL @ [facility] Follow up in 3-4 months.  Procedures:  No procedures performed Allergies: Clindamycin/lincomycin, Codeine, Demerol [meperidine], Erythromycin, Iodinated contrast media, Meperidine hcl, Ondansetron hcl, Penicillins, Sulfonamide derivatives, and Zofran [ondansetron hcl]   Assessment / Plan:     Visit Diagnoses: No diagnosis found.  Orders: No orders of the defined types were placed in this encounter.  No orders of the defined types were placed in this  encounter.   Face-to-face time spent with patient was *** minutes. Greater than 50% of time was spent in counseling and coordination of care.  Follow-Up Instructions: No follow-ups on file.   Earnestine Mealing, CMA  Note - This record has been created using Editor, commissioning.  Chart creation errors have been sought, but may not always  have been located. Such creation errors do not reflect on  the standard of medical care.

## 2022-01-25 ENCOUNTER — Ambulatory Visit (INDEPENDENT_AMBULATORY_CARE_PROVIDER_SITE_OTHER): Payer: Medicare Other | Admitting: Physician Assistant

## 2022-01-25 ENCOUNTER — Encounter: Payer: Self-pay | Admitting: Physician Assistant

## 2022-01-25 VITALS — BP 130/68 | HR 63 | Resp 16 | Ht 58.5 in | Wt 123.0 lb

## 2022-01-25 DIAGNOSIS — M5136 Other intervertebral disc degeneration, lumbar region: Secondary | ICD-10-CM | POA: Diagnosis not present

## 2022-01-25 DIAGNOSIS — K200 Eosinophilic esophagitis: Secondary | ICD-10-CM

## 2022-01-25 DIAGNOSIS — Z87448 Personal history of other diseases of urinary system: Secondary | ICD-10-CM | POA: Diagnosis not present

## 2022-01-25 DIAGNOSIS — Z8669 Personal history of other diseases of the nervous system and sense organs: Secondary | ICD-10-CM

## 2022-01-25 DIAGNOSIS — M797 Fibromyalgia: Secondary | ICD-10-CM | POA: Diagnosis not present

## 2022-01-25 DIAGNOSIS — F419 Anxiety disorder, unspecified: Secondary | ICD-10-CM

## 2022-01-25 DIAGNOSIS — I10 Essential (primary) hypertension: Secondary | ICD-10-CM

## 2022-01-25 DIAGNOSIS — U071 COVID-19: Secondary | ICD-10-CM

## 2022-01-25 DIAGNOSIS — M170 Bilateral primary osteoarthritis of knee: Secondary | ICD-10-CM

## 2022-01-25 DIAGNOSIS — M3219 Other organ or system involvement in systemic lupus erythematosus: Secondary | ICD-10-CM

## 2022-01-25 DIAGNOSIS — M19041 Primary osteoarthritis, right hand: Secondary | ICD-10-CM | POA: Diagnosis not present

## 2022-01-25 DIAGNOSIS — Z79899 Other long term (current) drug therapy: Secondary | ICD-10-CM | POA: Diagnosis not present

## 2022-01-25 DIAGNOSIS — E785 Hyperlipidemia, unspecified: Secondary | ICD-10-CM

## 2022-01-25 DIAGNOSIS — Z8719 Personal history of other diseases of the digestive system: Secondary | ICD-10-CM

## 2022-01-25 DIAGNOSIS — F32A Depression, unspecified: Secondary | ICD-10-CM

## 2022-01-25 DIAGNOSIS — G20 Parkinson's disease: Secondary | ICD-10-CM | POA: Diagnosis not present

## 2022-01-25 DIAGNOSIS — G20A1 Parkinson's disease without dyskinesia, without mention of fluctuations: Secondary | ICD-10-CM

## 2022-01-25 DIAGNOSIS — R21 Rash and other nonspecific skin eruption: Secondary | ICD-10-CM

## 2022-01-25 DIAGNOSIS — M3501 Sicca syndrome with keratoconjunctivitis: Secondary | ICD-10-CM | POA: Diagnosis not present

## 2022-01-25 DIAGNOSIS — M810 Age-related osteoporosis without current pathological fracture: Secondary | ICD-10-CM

## 2022-01-25 DIAGNOSIS — M19042 Primary osteoarthritis, left hand: Secondary | ICD-10-CM

## 2022-01-25 DIAGNOSIS — Z87442 Personal history of urinary calculi: Secondary | ICD-10-CM

## 2022-01-25 DIAGNOSIS — M51369 Other intervertebral disc degeneration, lumbar region without mention of lumbar back pain or lower extremity pain: Secondary | ICD-10-CM

## 2022-01-25 DIAGNOSIS — Z8639 Personal history of other endocrine, nutritional and metabolic disease: Secondary | ICD-10-CM

## 2022-01-26 NOTE — Progress Notes (Signed)
Complements and ESR WNL.  RBC count, hgb, and hct are slightly low.  Please advise patient to take a multivitamin with iron.   GFR is borderline low-59. Creatinine WNL.  Avoid the use of NSAIDs and try to remain adequately hydrated.  UA consistent with possible UTI-moderate bacteria and 6-10 WBCs.  Negative for nitrites.  If she is experiencing symptoms of a UTI she should follow up with her PCP for a urine culture.

## 2022-01-27 NOTE — Progress Notes (Signed)
dsDNA remains positive but is stable.

## 2022-01-30 NOTE — Progress Notes (Signed)
ANA remains positive-titer is unchanged.

## 2022-02-02 LAB — COMPLETE METABOLIC PANEL WITH GFR
AG Ratio: 1.4 (calc) (ref 1.0–2.5)
ALT: 9 U/L (ref 6–29)
AST: 16 U/L (ref 10–35)
Albumin: 4.2 g/dL (ref 3.6–5.1)
Alkaline phosphatase (APISO): 80 U/L (ref 37–153)
BUN: 17 mg/dL (ref 7–25)
CO2: 22 mmol/L (ref 20–32)
Calcium: 9.7 mg/dL (ref 8.6–10.4)
Chloride: 105 mmol/L (ref 98–110)
Creat: 1 mg/dL (ref 0.60–1.00)
Globulin: 3 g/dL (calc) (ref 1.9–3.7)
Glucose, Bld: 97 mg/dL (ref 65–99)
Potassium: 4.1 mmol/L (ref 3.5–5.3)
Sodium: 141 mmol/L (ref 135–146)
Total Bilirubin: 0.3 mg/dL (ref 0.2–1.2)
Total Protein: 7.2 g/dL (ref 6.1–8.1)
eGFR: 59 mL/min/{1.73_m2} — ABNORMAL LOW (ref >=60)

## 2022-02-02 LAB — URINALYSIS, ROUTINE W REFLEX MICROSCOPIC
Bilirubin Urine: NEGATIVE
Glucose, UA: NEGATIVE
Hgb urine dipstick: NEGATIVE
Hyaline Cast: NONE SEEN /LPF
Ketones, ur: NEGATIVE
Nitrite: NEGATIVE
Protein, ur: NEGATIVE
Specific Gravity, Urine: 1.019 (ref 1.001–1.035)
Squamous Epithelial / HPF: NONE SEEN /HPF (ref <=5)
pH: 5.5 (ref 5.0–8.0)

## 2022-02-02 LAB — CBC WITH DIFFERENTIAL/PLATELET
Absolute Monocytes: 475 cells/uL (ref 200–950)
Basophils Absolute: 60 cells/uL (ref 0–200)
Basophils Relative: 1.2 %
Eosinophils Absolute: 180 cells/uL (ref 15–500)
Eosinophils Relative: 3.6 %
HCT: 33.4 % — ABNORMAL LOW (ref 35.0–45.0)
Hemoglobin: 11 g/dL — ABNORMAL LOW (ref 11.7–15.5)
Lymphs Abs: 1195 cells/uL (ref 850–3900)
MCH: 30.1 pg (ref 27.0–33.0)
MCHC: 32.9 g/dL (ref 32.0–36.0)
MCV: 91.5 fL (ref 80.0–100.0)
MPV: 10.5 fL (ref 7.5–12.5)
Monocytes Relative: 9.5 %
Neutro Abs: 3090 cells/uL (ref 1500–7800)
Neutrophils Relative %: 61.8 %
Platelets: 212 10*3/uL (ref 140–400)
RBC: 3.65 10*6/uL — ABNORMAL LOW (ref 3.80–5.10)
RDW: 12.3 % (ref 11.0–15.0)
Total Lymphocyte: 23.9 %
WBC: 5 10*3/uL (ref 3.8–10.8)

## 2022-02-02 LAB — ANA: Anti Nuclear Antibody (ANA): POSITIVE — AB

## 2022-02-02 LAB — SEDIMENTATION RATE: Sed Rate: 29 mm/h (ref 0–30)

## 2022-02-02 LAB — C3 AND C4
C3 Complement: 151 mg/dL (ref 83–193)
C4 Complement: 20 mg/dL (ref 15–57)

## 2022-02-02 LAB — ANTI-NUCLEAR AB-TITER (ANA TITER): ANA Titer 1: 1:1280 {titer} — ABNORMAL HIGH

## 2022-02-02 LAB — ANTI-DNA ANTIBODY, DOUBLE-STRANDED: ds DNA Ab: 26 IU/mL — ABNORMAL HIGH

## 2022-02-02 LAB — HYDROXYCHLOROQUINE,BLOOD: HYDROXYCHLOROQUINE, (B): 750 ng/mL — ABNORMAL HIGH

## 2022-02-03 NOTE — Progress Notes (Signed)
Hydroxychloroquine level is a stable and in therapeutic range

## 2022-03-02 NOTE — Progress Notes (Signed)
Virtual Visit Via Video       Consent was obtained for video visit:  Yes.   Answered questions that patient had about telehealth interaction:  Yes.   I discussed the limitations, risks, security and privacy concerns of performing an evaluation and management service by telemedicine. I also discussed with the patient that there may be a patient responsible charge related to this service. The patient expressed understanding and agreed to proceed.  Pt location: Home Physician Location: office Name of referring provider:  Cleatis Polka., MD I connected with Veronica Jordan at patients initiation/request on 03/07/2022 at  2:30 PM EDT by video enabled telemedicine application and verified that I am speaking with the correct person using two identifiers. Pt MRN:  [id number] Pt DOB:  02/18/1946 Video Participants:  Veronica Jordan;    Assessment/Plan:   1.  Parkinsons Disease with family history of PD  -does not want genetic testing  -continue carbidopa/levodopa 25/100 CR, 1.5 tablet 3 times per day (she takes at 10am/2pm/6pm).  Discussed timing of meds.  Spreading meds too far out.  -refer to [facility]  -discussed rsb in [place].  Discussed that she needs better routine, and that includes exercise.  2.  Depression  -Follows with primary care  -discussed with her that lexapro may need to be increased.  She will d/w pcp at [facility], which is upcoming.  3.  Dysphagia  -MBE on October 24, 2019 was normal, but speech therapist noted that solids remained in the mid to distal esophagus for prolonged period and GI referral was recommended.  Patient declined.  She feels she has been stable since that time.  4.  Sjogren's, lupus, fibromyalgia             -Follows with Dr. Corliss Skains  5.  RBD  -no falling out of the bed.  Lexapro may contribute but primarily due to Parkinsons Disease itself   Subjective:   Veronica Jordan was seen today in follow up for Parkinsons disease.  My previous records  were reviewed prior to todays visit as well as outside records available to me.  We increased her levodopa just slightly last visit and she reports she is tolerating it well.  She states that she isn't sure how it is working; "I forget to take it."  She has an alarm but sometimes will ignore it or forget the alarm.  She doesn't really miss dosages but often the last will get pushed to bedtime.  Pt denies falls but "I've felt wobbly a few times."  She isn't exercising.    Pt denies lightheadedness, near syncope.  No hallucinations.  States that her "emotions" have really been up and down.  Dreaming/screaming a lot at night.   She has been following with rheumatology.  She last saw the PA on July 26.  Notes are reviewed.  Current prescribed movement disorder medications: Continue carbidopa/levodopa 25/100 CR, 1.5 tablet 3 times daily (increased)    ALLERGIES:   Allergies  Allergen Reactions   Clindamycin/Lincomycin     hives   Codeine Itching   Demerol [Meperidine] Nausea And Vomiting   Erythromycin    Iodinated Contrast Media    Meperidine Hcl    Ondansetron Hcl    Penicillins    Sulfonamide Derivatives    Zofran [Ondansetron Hcl]     CURRENT MEDICATIONS:  Outpatient Encounter Medications as of 03/07/2022  Medication Sig   albuterol (VENTOLIN HFA) 108 (90 Base) MCG/ACT inhaler SMARTSIG:2 Inhalation Via  Inhaler Every 4 Hours   amLODipine (NORVASC) 10 MG tablet Take 10 mg by mouth daily.   aspirin 81 MG tablet Take 81 mg by mouth daily.   brimonidine (ALPHAGAN P) 0.1 % SOLN    Calcium Carbonate-Vitamin D 600-400 MG-UNIT tablet Take 1 tablet by mouth daily.   Carbidopa-Levodopa ER (SINEMET CR) 25-100 MG tablet controlled release Take 1.5 tablets by mouth 3 (three) times daily. 10am/2pm/6pm   Coenzyme Q10 (COQ-10) 50 MG CAPS Take 1 capsule by mouth daily.   cycloSPORINE, PF, (CEQUA) 0.09 % SOLN  (Patient not taking: Reported on 01/25/2022)   DIOVAN 320 MG tablet Take 320 mg by mouth daily.    escitalopram (LEXAPRO) 10 MG tablet Take 10 mg by mouth daily.    fluticasone (FLONASE) 50 MCG/ACT nasal spray SHAKE LQ AND U 2 SPRAYS IEN D   hydrocortisone (ANUSOL-HC) 2.5 % rectal cream Apply topically 2 (two) times daily.   hydrocortisone (PROCTOZONE-HC) 2.5 % rectal cream 1 application Externally Twice a day for 30 days   hydroxychloroquine (PLAQUENIL) 200 MG tablet TAKE 1 TABLET DAILY FOR SYSTEMIC LUPUS ERYTHEMATOSUS   levothyroxine (SYNTHROID, LEVOTHROID) 25 MCG tablet Take 25 mcg by mouth daily.   loratadine (CLARITIN) 10 MG tablet Take 10 mg by mouth daily.   losartan (COZAAR) 100 MG tablet Take 100 mg by mouth daily.   Omega-3 Fatty Acids (OMEGA 3 PO) Take by mouth daily.   pantoprazole (PROTONIX) 40 MG tablet Take 40 mg by mouth daily.    simvastatin (ZOCOR) 20 MG tablet Take 20 mg by mouth at bedtime.   vitamin B-12 (CYANOCOBALAMIN) 1000 MCG tablet Take 1,000 mcg by mouth daily.   No facility-administered encounter medications on file as of 03/07/2022.    Objective:   PHYSICAL EXAMINATION:    VITALS:   There were no vitals filed for this visit.   GEN:  The patient appears stated age and is in NAD. HEENT:  Normocephalic, atraumatic.  The mucous membranes are moist.   Neurological examination:  Orientation: The patient is alert and oriented x3. Cranial nerves: There is good facial symmetry with min facial hypomimia. The speech is fluent and clear. Soft palate rises symmetrically and there is no tongue deviation. Hearing is intact to conversational tone. Sensation: unable Motor: Strength is at least antigravity x4.  Movement examination: Tone: unable Abnormal movements: can't see well enough (limbs not at rest and video not clear) Coordination:  There is no decremation with RAM's, with any form of RAMS, including alternating supination and pronation of the forearm, hand opening and closing, finger taps, heel taps and toe taps. Gait and Station: pt states would be  unable to see via video  I have reviewed and interpreted the following labs independently    Chemistry      Component Value Date/Time   NA 141 01/25/2022 1450   NA 141 01/13/2021 1115   K 4.1 01/25/2022 1450   CL 105 01/25/2022 1450   CO2 22 01/25/2022 1450   BUN 17 01/25/2022 1450   BUN 16 01/13/2021 1115   CREATININE 1.00 01/25/2022 1450      Component Value Date/Time   CALCIUM 9.7 01/25/2022 1450   ALKPHOS 111 01/13/2021 1115   AST 16 01/25/2022 1450   ALT 9 01/25/2022 1450   BILITOT 0.3 01/25/2022 1450   BILITOT 0.4 01/13/2021 1115       Lab Results  Component Value Date   WBC 5.0 01/25/2022   HGB 11.0 (L) 01/25/2022   HCT  33.4 (L) 01/25/2022   MCV 91.5 01/25/2022   PLT 212 01/25/2022    Lab Results  Component Value Date   TSH 2.57 07/24/2018   Follow up Instructions      -I discussed the assessment and treatment plan with the patient. The patient was provided an opportunity to ask questions and all were answered. The patient agreed with the plan and demonstrated an understanding of the instructions.   The patient was advised to call back or seek an in-person evaluation if the symptoms worsen or if the condition fails to improve as anticipated.     Kerin Salen, DO    Cc:  Cleatis Polka., MD

## 2022-03-07 ENCOUNTER — Telehealth (INDEPENDENT_AMBULATORY_CARE_PROVIDER_SITE_OTHER): Payer: Medicare Other | Admitting: Neurology

## 2022-03-07 ENCOUNTER — Encounter: Payer: Self-pay | Admitting: Neurology

## 2022-03-07 VITALS — Wt 122.0 lb

## 2022-03-07 DIAGNOSIS — G20 Parkinson's disease: Secondary | ICD-10-CM

## 2022-03-07 NOTE — Addendum Note (Signed)
Addended by: Karl Luke A on: 03/07/2022 02:39 PM   Modules accepted: Orders

## 2022-03-13 DIAGNOSIS — H40023 Open angle with borderline findings, high risk, bilateral: Secondary | ICD-10-CM | POA: Diagnosis not present

## 2022-03-13 DIAGNOSIS — E119 Type 2 diabetes mellitus without complications: Secondary | ICD-10-CM | POA: Diagnosis not present

## 2022-03-13 DIAGNOSIS — Z79899 Other long term (current) drug therapy: Secondary | ICD-10-CM | POA: Diagnosis not present

## 2022-03-13 DIAGNOSIS — H53452 Other localized visual field defect, left eye: Secondary | ICD-10-CM | POA: Diagnosis not present

## 2022-03-28 DIAGNOSIS — R7989 Other specified abnormal findings of blood chemistry: Secondary | ICD-10-CM | POA: Diagnosis not present

## 2022-03-28 DIAGNOSIS — D649 Anemia, unspecified: Secondary | ICD-10-CM | POA: Diagnosis not present

## 2022-03-28 DIAGNOSIS — I10 Essential (primary) hypertension: Secondary | ICD-10-CM | POA: Diagnosis not present

## 2022-03-28 DIAGNOSIS — R739 Hyperglycemia, unspecified: Secondary | ICD-10-CM | POA: Diagnosis not present

## 2022-03-28 DIAGNOSIS — E039 Hypothyroidism, unspecified: Secondary | ICD-10-CM | POA: Diagnosis not present

## 2022-04-04 DIAGNOSIS — M810 Age-related osteoporosis without current pathological fracture: Secondary | ICD-10-CM | POA: Diagnosis not present

## 2022-04-04 DIAGNOSIS — I129 Hypertensive chronic kidney disease with stage 1 through stage 4 chronic kidney disease, or unspecified chronic kidney disease: Secondary | ICD-10-CM | POA: Diagnosis not present

## 2022-04-04 DIAGNOSIS — F339 Major depressive disorder, recurrent, unspecified: Secondary | ICD-10-CM | POA: Diagnosis not present

## 2022-04-04 DIAGNOSIS — Z1339 Encounter for screening examination for other mental health and behavioral disorders: Secondary | ICD-10-CM | POA: Diagnosis not present

## 2022-04-04 DIAGNOSIS — M3210 Systemic lupus erythematosus, organ or system involvement unspecified: Secondary | ICD-10-CM | POA: Diagnosis not present

## 2022-04-04 DIAGNOSIS — Z23 Encounter for immunization: Secondary | ICD-10-CM | POA: Diagnosis not present

## 2022-04-04 DIAGNOSIS — N1831 Chronic kidney disease, stage 3a: Secondary | ICD-10-CM | POA: Diagnosis not present

## 2022-04-04 DIAGNOSIS — Z1331 Encounter for screening for depression: Secondary | ICD-10-CM | POA: Diagnosis not present

## 2022-04-04 DIAGNOSIS — K649 Unspecified hemorrhoids: Secondary | ICD-10-CM | POA: Diagnosis not present

## 2022-04-04 DIAGNOSIS — G20A1 Parkinson's disease without dyskinesia, without mention of fluctuations: Secondary | ICD-10-CM | POA: Diagnosis not present

## 2022-04-04 DIAGNOSIS — E1129 Type 2 diabetes mellitus with other diabetic kidney complication: Secondary | ICD-10-CM | POA: Diagnosis not present

## 2022-04-04 DIAGNOSIS — Z0000 Encounter for general adult medical examination without abnormal findings: Secondary | ICD-10-CM | POA: Diagnosis not present

## 2022-04-04 DIAGNOSIS — E1149 Type 2 diabetes mellitus with other diabetic neurological complication: Secondary | ICD-10-CM | POA: Diagnosis not present

## 2022-04-04 DIAGNOSIS — R82998 Other abnormal findings in urine: Secondary | ICD-10-CM | POA: Diagnosis not present

## 2022-04-04 DIAGNOSIS — I10 Essential (primary) hypertension: Secondary | ICD-10-CM | POA: Diagnosis not present

## 2022-04-04 DIAGNOSIS — E785 Hyperlipidemia, unspecified: Secondary | ICD-10-CM | POA: Diagnosis not present

## 2022-04-11 ENCOUNTER — Other Ambulatory Visit: Payer: Self-pay | Admitting: Physician Assistant

## 2022-04-11 DIAGNOSIS — M3219 Other organ or system involvement in systemic lupus erythematosus: Secondary | ICD-10-CM

## 2022-04-11 NOTE — Telephone Encounter (Signed)
Next Visit: 07/06/2022  Last Visit: 01/25/2022  Labs: 01/25/2022 Complements and ESR WNL.  RBC count, hgb, and hct are slightly low. GFR is borderline low-59. Creatinine WNL.   Eye exam: 11/23/2021 WNL   Current Dose per office note 01/25/2022: Plaquenil 200 mg 1 tablet by mouth daily.  DX: Other systemic lupus erythematosus with other organ involvement   Last Fill: 01/02/2022  Okay to refill Plaquenil?

## 2022-04-21 ENCOUNTER — Telehealth: Payer: Self-pay | Admitting: Neurology

## 2022-04-21 ENCOUNTER — Other Ambulatory Visit: Payer: Self-pay

## 2022-04-21 DIAGNOSIS — G20A1 Parkinson's disease without dyskinesia, without mention of fluctuations: Secondary | ICD-10-CM

## 2022-04-21 MED ORDER — CARBIDOPA-LEVODOPA ER 25-100 MG PO TBCR: 1.5000 | EXTENDED_RELEASE_TABLET | Freq: Three times a day (TID) | ORAL | 0 refills | Status: DC

## 2022-04-21 NOTE — Telephone Encounter (Signed)
1. Which medications need refilled? (List name and dosage, if known) carbidopa levodopa ER  2. Which pharmacy/location is medication to be sent to? (include street and city if local pharmacy) [facility]  3. Do they need a 30 day or 90 day supply? 90  Patient is almost out of the medication. She said she first requested this refill on 04/11/22.

## 2022-04-21 NOTE — Telephone Encounter (Signed)
Called patient and refill has been sent in

## 2022-05-24 DIAGNOSIS — G20B2 Parkinson's disease with dyskinesia, with fluctuations: Secondary | ICD-10-CM | POA: Diagnosis not present

## 2022-05-24 DIAGNOSIS — R809 Proteinuria, unspecified: Secondary | ICD-10-CM | POA: Diagnosis not present

## 2022-05-24 DIAGNOSIS — M329 Systemic lupus erythematosus, unspecified: Secondary | ICD-10-CM | POA: Diagnosis not present

## 2022-05-24 DIAGNOSIS — N181 Chronic kidney disease, stage 1: Secondary | ICD-10-CM | POA: Diagnosis not present

## 2022-05-24 DIAGNOSIS — I129 Hypertensive chronic kidney disease with stage 1 through stage 4 chronic kidney disease, or unspecified chronic kidney disease: Secondary | ICD-10-CM | POA: Diagnosis not present

## 2022-05-24 DIAGNOSIS — D631 Anemia in chronic kidney disease: Secondary | ICD-10-CM | POA: Diagnosis not present

## 2022-06-14 ENCOUNTER — Telehealth: Payer: Self-pay | Admitting: *Deleted

## 2022-06-14 NOTE — Telephone Encounter (Signed)
Labs received from:[facility]  Drawn on:05/24/2022  Reviewed by:Sherron Ales, PA-C  Labs drawn:Renal Function Panel, CBC, UA  Results:Glucose 153   UA: SG > 1.030          Cloudy          Protein 1 +          Trace Ketones.

## 2022-06-22 NOTE — Progress Notes (Deleted)
Office Visit Note  Patient: Veronica Jordan             Date of Birth: [birth year]           MRN: [id number]             PCP: Cleatis Polka., MD Referring: Cleatis Polka., MD Visit Date: 07/06/2022 Occupation: @GUAROCC @  Subjective:  No chief complaint on file.   History of Present Illness: Veronica Jordan is a 76 y.o. female ***     Activities of Daily Living:  Patient reports morning stiffness for *** {minute/hour:19697}.   Patient {ACTIONS;DENIES/REPORTS:21021675::"Denies"} nocturnal pain.  Difficulty dressing/grooming: {ACTIONS;DENIES/REPORTS:21021675::"Denies"} Difficulty climbing stairs: {ACTIONS;DENIES/REPORTS:21021675::"Denies"} Difficulty getting out of chair: {ACTIONS;DENIES/REPORTS:21021675::"Denies"} Difficulty using hands for taps, buttons, cutlery, and/or writing: {ACTIONS;DENIES/REPORTS:21021675::"Denies"}  No Rheumatology ROS completed.   PMFS History:  Patient Active Problem List   Diagnosis Date Noted   Sjogren's syndrome with keratoconjunctivitis sicca (HCC) 08/07/2018   History of nephritis 08/07/2018   History of optic neuritis 08/07/2018   Fibromyalgia 08/07/2018   Primary osteoarthritis of both hands 08/07/2018   Primary osteoarthritis of both knees 08/07/2018   DDD (degenerative disc disease), lumbar 08/07/2018   Eosinophilic esophagitis 08/07/2018   History of IBS 08/07/2018   History of gastroesophageal reflux (GERD) 08/07/2018   History of diverticulosis 08/07/2018   History of type 2 diabetes mellitus 08/07/2018   History of kidney stones 08/07/2018   History of peripheral neuropathy 08/07/2018   Dyslipidemia 08/07/2018   Anxiety and depression 08/07/2018   Essential tremor 08/07/2018   Postmenopausal hormone replacement therapy 03/05/2015   Osteoporosis 03/05/2015   Atrophic vaginitis 03/05/2015   HYPERTHYROIDISM 12/13/2007   Hypercholesterolemia 12/13/2007   Essential hypertension 12/13/2007   ALLERGIC RHINITIS 12/13/2007    LUPUS ERYTHEMATOSUS 12/13/2007   DYSPNEA 12/13/2007    Past Medical History:  Diagnosis Date   Allergic rhinitis    Anemia    Anxiety and depression    Depression    Diabetes mellitus    Diverticulitis    Fibromyalgia    GERD (gastroesophageal reflux disease)    History of gallstones    History of kidney stones    Hyperlipemia    Hypertension    Hypothyroidism    IBS (irritable bowel syndrome)    Osteoarthritis    Osteoporosis    Renal calculi    Sjogren's syndrome (HCC)    Systemic lupus (HCC) 2001    Family History  Problem Relation Age of Onset   Asthma Mother    COPD Mother    Heart disease Father    Asthma Sister    Parkinsonism Other        MGM   Hypertension Brother    Heart attack Brother    Arthritis Maternal Grandmother    Heart disease Maternal Grandfather    Lupus Paternal Grandmother    Heart attack Paternal Grandfather    Hypertension Sister    Chronic fatigue Daughter    Colon cancer Neg Hx    Esophageal cancer Neg Hx    Stomach cancer Neg Hx    Past Surgical History:  Procedure Laterality Date   APPENDECTOMY  1970   BREAST BIOPSY Left 08/2020   BREAST EXCISIONAL BIOPSY Left    CATARACT EXTRACTION     bilateral   CESAREAN SECTION  1976, 1977   x2   CHOLECYSTECTOMY  1988   TONSILLECTOMY AND ADENOIDECTOMY     TOTAL ABDOMINAL HYSTERECTOMY  2000   BSO due to fibroids  Social History   Social History Narrative   Youngest daughter died in [place].   Right handed    Lives in a two story home that has a basement   Immunization History  Administered Date(s) Administered   PFIZER(Purple Top)SARS-COV-2 Vaccination 08/29/2019, 09/26/2019     Objective: Vital Signs: LMP 07/03/1998 (Approximate)    Physical Exam   Musculoskeletal Exam: ***  CDAI Exam: CDAI Score: -- Patient Global: --; Provider Global: -- Swollen: --; Tender: -- Joint Exam 07/06/2022   No joint exam has been documented for this visit   There is currently no  information documented on the homunculus. Go to the Rheumatology activity and complete the homunculus joint exam.  Investigation: No additional findings.  Imaging: No results found.  Recent Labs: Lab Results  Component Value Date   WBC 5.0 01/25/2022   HGB 11.0 (L) 01/25/2022   PLT 212 01/25/2022   NA 141 01/25/2022   K 4.1 01/25/2022   CL 105 01/25/2022   CO2 22 01/25/2022   GLUCOSE 97 01/25/2022   BUN 17 01/25/2022   CREATININE 1.00 01/25/2022   BILITOT 0.3 01/25/2022   ALKPHOS 111 01/13/2021   AST 16 01/25/2022   ALT 9 01/25/2022   PROT 7.2 01/25/2022   ALBUMIN 4.1 01/13/2021   CALCIUM 9.7 01/25/2022   GFRAA 58 (L) 08/06/2020    Speciality Comments: PLQ Eye Exam: 11/23/2021 WNL @ [facility] Follow up in 3-4 months.  Procedures:  No procedures performed Allergies: Clindamycin/lincomycin, Codeine, Demerol [meperidine], Erythromycin, Iodinated contrast media, Meperidine hcl, Ondansetron hcl, Penicillins, Sulfonamide derivatives, and Zofran [ondansetron hcl]   Assessment / Plan:     Visit Diagnoses: No diagnosis found.  Orders: No orders of the defined types were placed in this encounter.  No orders of the defined types were placed in this encounter.   Face-to-face time spent with patient was *** minutes. Greater than 50% of time was spent in counseling and coordination of care.  Follow-Up Instructions: No follow-ups on file.   Ellen Henri, CMA  Note - This record has been created using Animal nutritionist.  Chart creation errors have been sought, but may not always  have been located. Such creation errors do not reflect on  the standard of medical care.

## 2022-07-04 ENCOUNTER — Other Ambulatory Visit: Payer: Self-pay

## 2022-07-04 DIAGNOSIS — G20A1 Parkinson's disease without dyskinesia, without mention of fluctuations: Secondary | ICD-10-CM

## 2022-07-04 MED ORDER — CARBIDOPA-LEVODOPA ER 25-100 MG PO TBCR: 1.5000 | EXTENDED_RELEASE_TABLET | Freq: Three times a day (TID) | ORAL | 0 refills | Status: DC

## 2022-07-06 ENCOUNTER — Ambulatory Visit: Payer: Medicare Other | Admitting: Rheumatology

## 2022-07-06 DIAGNOSIS — F419 Anxiety disorder, unspecified: Secondary | ICD-10-CM

## 2022-07-06 DIAGNOSIS — M5136 Other intervertebral disc degeneration, lumbar region: Secondary | ICD-10-CM

## 2022-07-06 DIAGNOSIS — M19041 Primary osteoarthritis, right hand: Secondary | ICD-10-CM

## 2022-07-06 DIAGNOSIS — M810 Age-related osteoporosis without current pathological fracture: Secondary | ICD-10-CM

## 2022-07-06 DIAGNOSIS — M3501 Sicca syndrome with keratoconjunctivitis: Secondary | ICD-10-CM

## 2022-07-06 DIAGNOSIS — M797 Fibromyalgia: Secondary | ICD-10-CM

## 2022-07-06 DIAGNOSIS — E785 Hyperlipidemia, unspecified: Secondary | ICD-10-CM

## 2022-07-06 DIAGNOSIS — G20A1 Parkinson's disease without dyskinesia, without mention of fluctuations: Secondary | ICD-10-CM

## 2022-07-06 DIAGNOSIS — Z8639 Personal history of other endocrine, nutritional and metabolic disease: Secondary | ICD-10-CM

## 2022-07-06 DIAGNOSIS — Z8669 Personal history of other diseases of the nervous system and sense organs: Secondary | ICD-10-CM

## 2022-07-06 DIAGNOSIS — Z87448 Personal history of other diseases of urinary system: Secondary | ICD-10-CM

## 2022-07-06 DIAGNOSIS — K200 Eosinophilic esophagitis: Secondary | ICD-10-CM

## 2022-07-06 DIAGNOSIS — U071 COVID-19: Secondary | ICD-10-CM

## 2022-07-06 DIAGNOSIS — M170 Bilateral primary osteoarthritis of knee: Secondary | ICD-10-CM

## 2022-07-06 DIAGNOSIS — Z87442 Personal history of urinary calculi: Secondary | ICD-10-CM

## 2022-07-06 DIAGNOSIS — Z8719 Personal history of other diseases of the digestive system: Secondary | ICD-10-CM

## 2022-07-06 DIAGNOSIS — Z79899 Other long term (current) drug therapy: Secondary | ICD-10-CM

## 2022-07-06 DIAGNOSIS — I10 Essential (primary) hypertension: Secondary | ICD-10-CM

## 2022-07-06 DIAGNOSIS — M3219 Other organ or system involvement in systemic lupus erythematosus: Secondary | ICD-10-CM

## 2022-07-21 ENCOUNTER — Other Ambulatory Visit: Payer: Self-pay | Admitting: Physician Assistant

## 2022-07-21 DIAGNOSIS — M3219 Other organ or system involvement in systemic lupus erythematosus: Secondary | ICD-10-CM

## 2022-07-24 NOTE — Telephone Encounter (Signed)
Next Visit: 08/03/2022   Last Visit: 01/25/2022   Labs: 05/24/2022 Glucose 153   UA: SG > 1.030          Cloudy          Protein 1 +          Trace Ketones.  Eye exam: 11/23/2021 WNL    Current Dose per office note 01/25/2022: Plaquenil 200 mg 1 tablet by mouth daily.   DX: Other systemic lupus erythematosus with other organ involvement    Last Fill: 04/11/2022   Okay to refill Plaquenil?

## 2022-07-25 NOTE — Progress Notes (Unsigned)
Office Visit Note  Patient: Veronica Jordan             Date of Birth: September 08, 1945           MRN: [id number]             PCP: Ginger Organ., MD Referring: Ginger Organ., MD Visit Date: 08/03/2022 Occupation: @GUAROCC @  Subjective:  Fatigue   History of Present Illness: Veronica Jordan is a 77 y.o. female with history of systemic lupus erythematosus, osteoarthritis, and osteoporosis.  She is taking plaquenil 200 mg 1 tablet by mouth daily.  Patient reports that she has been out of her prescription for Plaquenil for the past 1 week but should be receiving a shipment soon.  She denies any signs or symptoms of a systemic lupus flare recently.  She states she continues to experience fatigue on a daily basis but is unsure if it is due to underlying lupus, fibromyalgia, or Parkinson's.  Patient reports that she has intermittent stiffness in both hands but denies any joint swelling.  She states she experiences pain in both feet due to neuropathy at night.  She also has intermittent myalgias and muscle tenderness due to fibromyalgia. She denies any recent sores in her mouth or nose.  She has not had any symptoms of Raynaud's phenomenon.  She denies any increased hair loss.  She does have ongoing sicca symptoms which have been tolerable overall.    Activities of Daily Living:  Patient reports morning stiffness for 20 minutes  Patient Reports nocturnal pain.  Difficulty dressing/grooming: Denies Difficulty climbing stairs: Reports Difficulty getting out of chair: Reports Difficulty using hands for taps, buttons, cutlery, and/or writing: Reports  Review of Systems  Constitutional:  Positive for fatigue.  HENT:  Positive for mouth dryness. Negative for mouth sores and nose dryness.   Eyes:  Positive for dryness. Negative for pain and visual disturbance.  Respiratory:  Negative for cough, hemoptysis, shortness of breath and difficulty breathing.   Cardiovascular:  Negative for chest  pain, palpitations, hypertension and swelling in legs/feet.  Gastrointestinal:  Positive for constipation and diarrhea. Negative for blood in stool.  Endocrine: Negative for increased urination.  Genitourinary:  Negative for painful urination.  Musculoskeletal:  Positive for morning stiffness. Negative for joint pain, joint pain, joint swelling, myalgias, muscle weakness, muscle tenderness and myalgias.  Skin:  Negative for color change, pallor, rash, hair loss, nodules/bumps, skin tightness, ulcers and sensitivity to sunlight.  Neurological:  Negative for dizziness, numbness, headaches and weakness.  Hematological:  Negative for swollen glands.  Psychiatric/Behavioral:  Positive for depressed mood and sleep disturbance. The patient is nervous/anxious.     PMFS History:  Patient Active Problem List   Diagnosis Date Noted   Sjogren's syndrome with keratoconjunctivitis sicca ([place]) 08/07/2018   History of nephritis 08/07/2018   History of optic neuritis 08/07/2018   Fibromyalgia 08/07/2018   Primary osteoarthritis of both hands 08/07/2018   Primary osteoarthritis of both knees 08/07/2018   DDD (degenerative disc disease), lumbar 29/47/6546   Eosinophilic esophagitis 50/35/4656   History of IBS 08/07/2018   History of gastroesophageal reflux (GERD) 08/07/2018   History of diverticulosis 08/07/2018   History of type 2 diabetes mellitus 08/07/2018   History of kidney stones 08/07/2018   History of peripheral neuropathy 08/07/2018   Dyslipidemia 08/07/2018   Anxiety and depression 08/07/2018   Essential tremor 08/07/2018   Postmenopausal hormone replacement therapy 03/05/2015   Osteoporosis 03/05/2015   Atrophic vaginitis  03/05/2015   HYPERTHYROIDISM 12/13/2007   Hypercholesterolemia 12/13/2007   Essential hypertension 12/13/2007   ALLERGIC RHINITIS 12/13/2007   LUPUS ERYTHEMATOSUS 12/13/2007   DYSPNEA 12/13/2007    Past Medical History:  Diagnosis Date   Allergic rhinitis     Anemia    Anxiety and depression    Depression    Diabetes mellitus    Diverticulitis    Fibromyalgia    GERD (gastroesophageal reflux disease)    History of gallstones    History of kidney stones    Hyperlipemia    Hypertension    Hypothyroidism    IBS (irritable bowel syndrome)    Osteoarthritis    Osteoporosis    Renal calculi    Sjogren's syndrome ([place])    Systemic lupus ([place]) 2001    Family History  Problem Relation Age of Onset   Asthma Mother    COPD Mother    Heart disease Father    Asthma Sister    Parkinsonism Other        MGM   Hypertension Brother    Heart attack Brother    Arthritis Maternal Grandmother    Heart disease Maternal Grandfather    Lupus Paternal Grandmother    Heart attack Paternal Grandfather    Hypertension Sister    Chronic fatigue Daughter    Colon cancer Neg Hx    Esophageal cancer Neg Hx    Stomach cancer Neg Hx    Past Surgical History:  Procedure Laterality Date   APPENDECTOMY  1970   BREAST BIOPSY Left 08/2020   BREAST EXCISIONAL BIOPSY Left    CATARACT EXTRACTION     bilateral   CESAREAN SECTION  1976, 1977   x2   CHOLECYSTECTOMY  1988   TONSILLECTOMY AND ADENOIDECTOMY     TOTAL ABDOMINAL HYSTERECTOMY  2000   BSO due to fibroids   Social History   Social History Narrative   Youngest daughter died in [place].   Right handed    Lives in a two story home that has a basement   Immunization History  Administered Date(s) Administered   PFIZER(Purple Top)SARS-COV-2 Vaccination 08/29/2019, 09/26/2019     Objective: Vital Signs: BP 123/70 (BP Location: Left Arm, Patient Position: Sitting, Cuff Size: Normal)   Pulse 63   Resp 16   Ht 4' 10.5" (1.486 m)   Wt 126 lb (57.2 kg)   LMP 07/03/1998 (Approximate)   BMI 25.89 kg/m    Physical Exam Vitals and nursing note reviewed.  Constitutional:      Appearance: She is well-developed.  HENT:     Head: Normocephalic and atraumatic.  Eyes:     Conjunctiva/sclera:  Conjunctivae normal.  Cardiovascular:     Rate and Rhythm: Normal rate and regular rhythm.     Heart sounds: Normal heart sounds.  Pulmonary:     Effort: Pulmonary effort is normal.     Breath sounds: Normal breath sounds.  Abdominal:     General: Bowel sounds are normal.     Palpations: Abdomen is soft.  Musculoskeletal:     Cervical back: Normal range of motion.  Skin:    General: Skin is warm and dry.     Capillary Refill: Capillary refill takes less than 2 seconds.  Neurological:     Mental Status: She is alert and oriented to person, place, and time.  Psychiatric:        Behavior: Behavior normal.      Musculoskeletal Exam: C-spine has good range of motion with  no discomfort.  Some discomfort with lumbar range of motion.  Midline spinal tenderness in the lumbar region.  Shoulder joints, elbow joints, wrist joints, MCPs, PIPs, DIPs have good range of motion with no synovitis.  PIP and DIP thickening consistent with osteoarthritis of both hands.  Hip joints have good range of motion with no groin pain.  Knee joints have good range of motion with no warmth or effusion.  Ankle joints have good range of motion no tenderness or synovitis.  No tenderness or synovitis over MTP joints.  Dorsal spurs noted bilaterally.  CDAI Exam: CDAI Score: -- Patient Global: --; Provider Global: -- Swollen: --; Tender: -- Joint Exam 08/03/2022   No joint exam has been documented for this visit   There is currently no information documented on the homunculus. Go to the Rheumatology activity and complete the homunculus joint exam.  Investigation: No additional findings.  Imaging: No results found.  Recent Labs: Lab Results  Component Value Date   WBC 5.0 01/25/2022   HGB 11.0 (L) 01/25/2022   PLT 212 01/25/2022   NA 141 01/25/2022   K 4.1 01/25/2022   CL 105 01/25/2022   CO2 22 01/25/2022   GLUCOSE 97 01/25/2022   BUN 17 01/25/2022   CREATININE 1.00 01/25/2022   BILITOT 0.3 01/25/2022    ALKPHOS 111 01/13/2021   AST 16 01/25/2022   ALT 9 01/25/2022   PROT 7.2 01/25/2022   ALBUMIN 4.1 01/13/2021   CALCIUM 9.7 01/25/2022   GFRAA 58 (L) 08/06/2020    Speciality Comments: PLQ Eye Exam: 11/23/2021 WNL @ [facility] Follow up in 3-4 months.  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  She has clinically been doing well taking Plaquenil 200 mg 1 tablet by mouth daily.  She has been tolerating Plaquenil without any side effects.  She has no synovitis on examination today.  She continues to have chronic sicca symptoms but has not had any oral or nasal ulcerations.  She has not had any symptoms of Raynaud's phenomenon.  No signs of alopecia, malar rash, or photosensitivity. She continues to experience fatigue on a daily basis likely due to underlying fibromyalgia and Parkinson's. Lab work from 01/25/2022 was reviewed today in the office: ANA 1: 1280 NS, double-stranded DNA 26, ESR within normal limits, Complements WNL, hydroxychloroquine blood level 750, and no proteinuria.  The following lab work will be obtained today for further evaluation.  She will remain on Plaquenil as prescribed.  She was advised to notify us if she develops signs or symptoms of a flare.  She will follow-up in the office in 5 months or sooner if needed. - Plan: COMPLETE METABOLIC PANEL WITH GFR, Anti-DNA antibody, double-stranded, C3 and C4, Sedimentation rate, VITAMIN D 25 Hydroxy (Vit-D Deficiency, Fractures),  Protein / creatinine ratio, urine, CBC with Differential/Platelet  High risk medication use - Plaquenil 200 mg 1 tablet by mouth daily.  PLQ Eye Exam: 11/23/2021 WNL @ [facility].  She has an upcoming Plaquenil eye examination scheduled. CBC and CMP were updated on 05/24/2022: Creatinine 0.85, GFR 72,  white blood cell count 5.7, red blood cell count 3.97, hemoglobin 11.5, platelet count 212, albumin creatinine ratio 20-WNL.  CBC and CMP were updated today. - Plan: COMPLETE METABOLIC PANEL WITH GFR, CBC with Differential/Platelet  History of optic neuritis - Followed by Dr. Jimmey Ralph with [facility].  History of nephritis - Protein creatinine ratio within normal limits on 09/26/2021.  No proteinuria on 01/25/2022.  Protein creatinine ratio ordered today.  Sjogren's syndrome with keratoconjunctivitis sicca (HCC) - ANA+, Ro+, La+, positive RF: She continues to have chronic sicca symptoms.  She has been using over-the-counter products for symptomatic relief.  She has not had any oral or nasal ulcerations.  Discussed the importance of seeing the dentist every 6 months and the ophthalmologist on a yearly basis.  She will remain on Plaquenil as prescribed.  Primary osteoarthritis of both hands: She has PIP and DIP thickening consistent with osteoarthritis of both hands.  No synovitis noted.  She experiences intermittent stiffness in both hands.  Discussed the importance of joint protection and muscle strengthening.  Primary osteoarthritis of both knees: She has good range of motion of both knee joints on examination today.  No warmth or effusion noted.  DDD (degenerative disc disease), lumbar: No midline spinal tenderness at this time.  Age-related osteoporosis without current pathological fracture - DEXA ordered by PCP. DEXA normal on 05/26/14.  Due to update DEXA  Vitamin D deficiency - Vitamin D will be checked today. Plan: VITAMIN D 25 Hydroxy (Vit-D Deficiency,  Fractures)  Fibromyalgia: She experiences intermittent myalgias and muscle tenderness due to fibromyalgia.  She also has intermittent bouts of fatigue.  Discussed the importance of regular exercise and good sleep hygiene.  Other medical conditions are listed as follows:   Eosinophilic esophagitis  History of IBS  Dyslipidemia  History of type 2 diabetes mellitus  Anxiety and depression  History of kidney stones  Essential hypertension: BP was 123/70 today in the office.   History of diverticulosis  History of gastroesophageal reflux (GERD)  History of Parkinson's disease    Orders: Orders Placed This Encounter  Procedures   COMPLETE METABOLIC PANEL WITH GFR   Anti-DNA antibody, double-stranded   C3 and C4   Sedimentation rate   VITAMIN D 25 Hydroxy (Vit-D Deficiency, Fractures)   Protein / creatinine ratio, urine   CBC with Differential/Platelet   No orders of the defined types were placed in this encounter.   Follow-Up Instructions: Return in about 5 months (around 01/01/2023) for Systemic lupus erythematosus, Osteoarthritis, Osteoporosis.   Gearldine Bienenstock, PA-C  Note - This record has been created using Dragon software.  Chart creation errors have been sought, but may not always  have been located. Such creation errors do not reflect on  the standard of medical care.

## 2022-08-03 ENCOUNTER — Ambulatory Visit: Payer: Medicare Other | Attending: Physician Assistant | Admitting: Physician Assistant

## 2022-08-03 ENCOUNTER — Encounter: Payer: Self-pay | Admitting: Physician Assistant

## 2022-08-03 VITALS — BP 123/70 | HR 63 | Resp 16 | Ht 58.5 in | Wt 126.0 lb

## 2022-08-03 DIAGNOSIS — Z8719 Personal history of other diseases of the digestive system: Secondary | ICD-10-CM | POA: Diagnosis not present

## 2022-08-03 DIAGNOSIS — E559 Vitamin D deficiency, unspecified: Secondary | ICD-10-CM

## 2022-08-03 DIAGNOSIS — F419 Anxiety disorder, unspecified: Secondary | ICD-10-CM

## 2022-08-03 DIAGNOSIS — Z8639 Personal history of other endocrine, nutritional and metabolic disease: Secondary | ICD-10-CM

## 2022-08-03 DIAGNOSIS — Z87442 Personal history of urinary calculi: Secondary | ICD-10-CM | POA: Diagnosis not present

## 2022-08-03 DIAGNOSIS — M5136 Other intervertebral disc degeneration, lumbar region: Secondary | ICD-10-CM | POA: Diagnosis not present

## 2022-08-03 DIAGNOSIS — M810 Age-related osteoporosis without current pathological fracture: Secondary | ICD-10-CM | POA: Diagnosis not present

## 2022-08-03 DIAGNOSIS — K200 Eosinophilic esophagitis: Secondary | ICD-10-CM

## 2022-08-03 DIAGNOSIS — M19042 Primary osteoarthritis, left hand: Secondary | ICD-10-CM

## 2022-08-03 DIAGNOSIS — Z79899 Other long term (current) drug therapy: Secondary | ICD-10-CM | POA: Diagnosis not present

## 2022-08-03 DIAGNOSIS — Z87448 Personal history of other diseases of urinary system: Secondary | ICD-10-CM | POA: Diagnosis not present

## 2022-08-03 DIAGNOSIS — I10 Essential (primary) hypertension: Secondary | ICD-10-CM

## 2022-08-03 DIAGNOSIS — M3501 Sicca syndrome with keratoconjunctivitis: Secondary | ICD-10-CM | POA: Diagnosis not present

## 2022-08-03 DIAGNOSIS — E785 Hyperlipidemia, unspecified: Secondary | ICD-10-CM | POA: Diagnosis not present

## 2022-08-03 DIAGNOSIS — M19041 Primary osteoarthritis, right hand: Secondary | ICD-10-CM | POA: Diagnosis not present

## 2022-08-03 DIAGNOSIS — M797 Fibromyalgia: Secondary | ICD-10-CM

## 2022-08-03 DIAGNOSIS — F32A Depression, unspecified: Secondary | ICD-10-CM | POA: Diagnosis not present

## 2022-08-03 DIAGNOSIS — Z8669 Personal history of other diseases of the nervous system and sense organs: Secondary | ICD-10-CM

## 2022-08-03 DIAGNOSIS — M170 Bilateral primary osteoarthritis of knee: Secondary | ICD-10-CM | POA: Diagnosis not present

## 2022-08-03 DIAGNOSIS — M3219 Other organ or system involvement in systemic lupus erythematosus: Secondary | ICD-10-CM

## 2022-08-03 DIAGNOSIS — M51369 Other intervertebral disc degeneration, lumbar region without mention of lumbar back pain or lower extremity pain: Secondary | ICD-10-CM

## 2022-08-03 DIAGNOSIS — D649 Anemia, unspecified: Secondary | ICD-10-CM | POA: Diagnosis not present

## 2022-08-06 NOTE — Progress Notes (Signed)
Vitamin D WNL.  CMP WNL.  RBC count, hgb, and hct are low.  Please add iron panel.  Total urine protein is borderline elevated-25.  Protein creatinine ratio WNL. ESR is borderline elevated-33.  dsDNA remains positive but has improved.  Complements Wnl.  Recommend repeating lab work in 3 months.

## 2022-08-07 ENCOUNTER — Other Ambulatory Visit: Payer: Self-pay

## 2022-08-07 DIAGNOSIS — E559 Vitamin D deficiency, unspecified: Secondary | ICD-10-CM

## 2022-08-07 DIAGNOSIS — M3219 Other organ or system involvement in systemic lupus erythematosus: Secondary | ICD-10-CM

## 2022-08-07 DIAGNOSIS — Z79899 Other long term (current) drug therapy: Secondary | ICD-10-CM

## 2022-08-08 LAB — COMPLETE METABOLIC PANEL WITH GFR
AG Ratio: 1.6 (calc) (ref 1.0–2.5)
ALT: 6 U/L (ref 6–29)
AST: 15 U/L (ref 10–35)
Albumin: 4.4 g/dL (ref 3.6–5.1)
Alkaline phosphatase (APISO): 86 U/L (ref 37–153)
BUN: 15 mg/dL (ref 7–25)
CO2: 26 mmol/L (ref 20–32)
Calcium: 9.1 mg/dL (ref 8.6–10.4)
Chloride: 105 mmol/L (ref 98–110)
Creat: 0.94 mg/dL (ref 0.60–1.00)
Globulin: 2.8 g/dL (calc) (ref 1.9–3.7)
Glucose, Bld: 98 mg/dL (ref 65–99)
Potassium: 3.8 mmol/L (ref 3.5–5.3)
Sodium: 139 mmol/L (ref 135–146)
Total Bilirubin: 0.3 mg/dL (ref 0.2–1.2)
Total Protein: 7.2 g/dL (ref 6.1–8.1)
eGFR: 63 mL/min/{1.73_m2} (ref >=60)

## 2022-08-08 LAB — TEST AUTHORIZATION

## 2022-08-08 LAB — CBC WITH DIFFERENTIAL/PLATELET
Absolute Monocytes: 475 cells/uL (ref 200–950)
Basophils Absolute: 70 cells/uL (ref 0–200)
Basophils Relative: 1.3 %
Eosinophils Absolute: 151 cells/uL (ref 15–500)
Eosinophils Relative: 2.8 %
HCT: 33.3 % — ABNORMAL LOW (ref 35.0–45.0)
Hemoglobin: 10.9 g/dL — ABNORMAL LOW (ref 11.7–15.5)
Lymphs Abs: 1490 cells/uL (ref 850–3900)
MCH: 29.5 pg (ref 27.0–33.0)
MCHC: 32.7 g/dL (ref 32.0–36.0)
MCV: 90.2 fL (ref 80.0–100.0)
MPV: 10.6 fL (ref 7.5–12.5)
Monocytes Relative: 8.8 %
Neutro Abs: 3213 cells/uL (ref 1500–7800)
Neutrophils Relative %: 59.5 %
Platelets: 225 10*3/uL (ref 140–400)
RBC: 3.69 10*6/uL — ABNORMAL LOW (ref 3.80–5.10)
RDW: 12.4 % (ref 11.0–15.0)
Total Lymphocyte: 27.6 %
WBC: 5.4 10*3/uL (ref 3.8–10.8)

## 2022-08-08 LAB — PROTEIN / CREATININE RATIO, URINE
Creatinine, Urine: 241 mg/dL (ref 20–275)
Protein/Creat Ratio: 104 mg/g creat (ref 24–184)
Protein/Creatinine Ratio: 0.104 mg/mg creat (ref 0.024–0.184)
Total Protein, Urine: 25 mg/dL — ABNORMAL HIGH (ref 5–24)

## 2022-08-08 LAB — IRON, TOTAL/TOTAL IRON BINDING CAP
%SAT: 12 % (calc) — ABNORMAL LOW (ref 16–45)
Iron: 49 ug/dL (ref 45–160)
TIBC: 393 mcg/dL (calc) (ref 250–450)

## 2022-08-08 LAB — C3 AND C4
C3 Complement: 154 mg/dL (ref 83–193)
C4 Complement: 22 mg/dL (ref 15–57)

## 2022-08-08 LAB — VITAMIN D 25 HYDROXY (VIT D DEFICIENCY, FRACTURES): Vit D, 25-Hydroxy: 35 ng/mL (ref 30–100)

## 2022-08-08 LAB — SEDIMENTATION RATE: Sed Rate: 33 mm/h — ABNORMAL HIGH (ref 0–30)

## 2022-08-08 LAB — ANTI-DNA ANTIBODY, DOUBLE-STRANDED: ds DNA Ab: 20 IU/mL — ABNORMAL HIGH

## 2022-08-08 NOTE — Progress Notes (Signed)
%   iron saturation is low-12%.  Total iron and TIBC WNL.  Plan to recheck lab work in 3 months.

## 2022-08-31 ENCOUNTER — Other Ambulatory Visit: Payer: Self-pay | Admitting: Internal Medicine

## 2022-08-31 DIAGNOSIS — Z1231 Encounter for screening mammogram for malignant neoplasm of breast: Secondary | ICD-10-CM

## 2022-09-06 DIAGNOSIS — Z79899 Other long term (current) drug therapy: Secondary | ICD-10-CM | POA: Diagnosis not present

## 2022-09-06 DIAGNOSIS — E119 Type 2 diabetes mellitus without complications: Secondary | ICD-10-CM | POA: Diagnosis not present

## 2022-09-06 DIAGNOSIS — H40023 Open angle with borderline findings, high risk, bilateral: Secondary | ICD-10-CM | POA: Diagnosis not present

## 2022-09-06 DIAGNOSIS — H53452 Other localized visual field defect, left eye: Secondary | ICD-10-CM | POA: Diagnosis not present

## 2022-09-18 NOTE — Progress Notes (Signed)
Assessment/Plan:   1.  Parkinsons Disease with family history of PD             -does not want genetic testing             -continue carbidopa/levodopa 25/100 CR, 1.5 tablet 3 times per day (she takes at 10am/2pm/6pm).  Discussed timing of meds.  Spreading meds too far out.    2.  Depression             -Follows with primary care             -***discussed with her that lexapro may need to be increased.  She will d/w pcp at PE, which is upcoming.   3.  Dysphagia             -MBE on October 24, 2019 was normal, but speech therapist noted that solids remained in the mid to distal esophagus for prolonged period and GI referral was recommended.  Patient declined.  She feels she has been stable since that time.   4.  Sjogren's, lupus, fibromyalgia             -Follows with Dr. Estanislado Pandy   5.  RBD             -no falling out of the bed.  Lexapro may contribute but primarily due to Parkinsons Disease itself   Subjective:   Veronica Jordan was seen today in follow up for Parkinsons disease.  My previous records were reviewed prior to todays visit as well as outside records available to me.  I have not seen the patient in person for about a year, as her last visit was on video.  She has not had falls.  No lightheadedness or near syncope.  In regards to mood, she states that ***  Current prescribed movement disorder medications: Continue carbidopa/levodopa 25/100 CR, 1 tablet 3 times daily    ALLERGIES:   Allergies  Allergen Reactions   Clindamycin/Lincomycin     hives   Codeine Itching   Demerol [Meperidine] Nausea And Vomiting   Erythromycin    Iodinated Contrast Media    Meperidine Hcl    Ondansetron Hcl    Penicillins    Sulfonamide Derivatives    Zofran [Ondansetron Hcl]     CURRENT MEDICATIONS:  Outpatient Encounter Medications as of 09/19/2022  Medication Sig   albuterol (VENTOLIN HFA) 108 (90 Base) MCG/ACT inhaler SMARTSIG:2 Inhalation Via Inhaler Every 4 Hours    amLODipine (NORVASC) 10 MG tablet Take 10 mg by mouth daily.   aspirin 81 MG tablet Take 81 mg by mouth daily.   brimonidine (ALPHAGAN P) 0.1 % SOLN    Calcium Carbonate-Vitamin D 600-400 MG-UNIT tablet Take 1 tablet by mouth daily.   Carbidopa-Levodopa ER (SINEMET CR) 25-100 MG tablet controlled release Take 1.5 tablets by mouth 3 (three) times daily. 10am/2pm/6pm   Coenzyme Q10 (COQ-10) 50 MG CAPS Take 1 capsule by mouth daily.   cycloSPORINE, PF, (CEQUA) 0.09 % SOLN  (Patient not taking: Reported on 01/25/2022)   DIOVAN 320 MG tablet Take 320 mg by mouth daily.   escitalopram (LEXAPRO) 10 MG tablet Take 10 mg by mouth daily.    fluticasone (FLONASE) 50 MCG/ACT nasal spray SHAKE LQ AND U 2 SPRAYS IEN D   hydrocortisone (ANUSOL-HC) 2.5 % rectal cream Apply topically 2 (two) times daily. (Patient not taking: Reported on 08/03/2022)   hydrocortisone (PROCTOZONE-HC) 2.5 % rectal cream 1 application Externally Twice a day for  30 days (Patient not taking: Reported on 08/03/2022)   hydroxychloroquine (PLAQUENIL) 200 MG tablet TAKE 1 TABLET DAILY FOR SYSTEMIC LUPUS ERYTHEMATOSUS   levothyroxine (SYNTHROID, LEVOTHROID) 25 MCG tablet Take 25 mcg by mouth daily.   loratadine (CLARITIN) 10 MG tablet Take 10 mg by mouth daily.   losartan (COZAAR) 100 MG tablet Take 100 mg by mouth daily.   Omega-3 Fatty Acids (OMEGA 3 PO) Take by mouth daily.   pantoprazole (PROTONIX) 40 MG tablet Take 40 mg by mouth daily.    simvastatin (ZOCOR) 20 MG tablet Take 20 mg by mouth at bedtime.   triamcinolone cream (KENALOG) 0.1 % Apply topically.   vitamin B-12 (CYANOCOBALAMIN) 1000 MCG tablet Take 1,000 mcg by mouth daily.   No facility-administered encounter medications on file as of 09/19/2022.    Objective:   PHYSICAL EXAMINATION:    VITALS:   There were no vitals filed for this visit.   GEN:  The patient appears stated age and is in NAD. HEENT:  Normocephalic, atraumatic.  The mucous membranes are moist. The  superficial temporal arteries are without ropiness or tenderness. CV:  RRR Lungs:  CTAB Neck/HEME:  There are no carotid bruits bilaterally.  Neurological examination:  Orientation: The patient is alert and oriented x3. Cranial nerves: There is good facial symmetry with min facial hypomimia. The speech is fluent and clear. Soft palate rises symmetrically and there is no tongue deviation. Hearing is intact to conversational tone. Sensation: Sensation is intact to light touch throughout Motor: Strength is at least antigravity x4.  Movement examination: Tone: There is mild increased tone in the LUE Abnormal movements: she has L foot tremor and rare L hand tremor Coordination:  There is no decremation with RAM's, with any form of RAMS, including alternating supination and pronation of the forearm, hand opening and closing, finger taps, heel taps and toe taps. Gait and Station: The patient has no difficulty arising out of a deep-seated chair without the use of the hands. The patient's stride length is slightly decreased and she slightly drags the L leg  I have reviewed and interpreted the following labs independently    Chemistry      Component Value Date/Time   NA 139 08/03/2022 1321   NA 141 01/13/2021 1115   K 3.8 08/03/2022 1321   CL 105 08/03/2022 1321   CO2 26 08/03/2022 1321   BUN 15 08/03/2022 1321   BUN 16 01/13/2021 1115   CREATININE 0.94 08/03/2022 1321      Component Value Date/Time   CALCIUM 9.1 08/03/2022 1321   ALKPHOS 111 01/13/2021 1115   AST 15 08/03/2022 1321   ALT 6 08/03/2022 1321   BILITOT 0.3 08/03/2022 1321   BILITOT 0.4 01/13/2021 1115       Lab Results  Component Value Date   WBC 5.4 08/03/2022   HGB 10.9 (L) 08/03/2022   HCT 33.3 (L) 08/03/2022   MCV 90.2 08/03/2022   PLT 225 08/03/2022    Lab Results  Component Value Date   TSH 2.57 07/24/2018    Total time spent on today's visit was *** minutes, including both face-to-face time and  nonface-to-face time.  Time included that spent on review of records (prior notes available to me/labs/imaging if pertinent), discussing treatment and goals, answering patient's questions and coordinating care.  Cc:  Ginger Organ., MD

## 2022-09-19 ENCOUNTER — Ambulatory Visit (INDEPENDENT_AMBULATORY_CARE_PROVIDER_SITE_OTHER): Payer: Medicare Other | Admitting: Neurology

## 2022-09-19 ENCOUNTER — Encounter: Payer: Self-pay | Admitting: Neurology

## 2022-09-19 VITALS — BP 116/60 | HR 67 | Ht <= 58 in | Wt 124.6 lb

## 2022-09-19 DIAGNOSIS — G20A1 Parkinson's disease without dyskinesia, without mention of fluctuations: Secondary | ICD-10-CM

## 2022-09-19 MED ORDER — CARBIDOPA-LEVODOPA ER 25-100 MG PO TBCR: 2.0000 | EXTENDED_RELEASE_TABLET | Freq: Three times a day (TID) | ORAL | 1 refills | Status: DC

## 2022-09-19 NOTE — Patient Instructions (Signed)
Increase carbidopa/levodopa 25/100, 2 tablets three times per day  Local and Online Resources for Power over Parkinson's Group  March 2024   LOCAL [place] PARKINSON'S GROUPS   Power over Parkinson's Group:    Power Over Parkinson's Patient Education Group will be Wednesday, March 13th-*Hybrid meting*- in person at [facility] location and via [facility], 2:00-3:00 pm.   Starting in November 2023, Power over [employer] and Care Partner Groups will meet together, with plans for separate break out session for caregivers (*this will be evolving over the next few months) Upcoming Power over Parkinson's Meetings/Care Partner Support:  2nd Wednesdays of the month at 2 pm:  March 13th, April 10th [place] at [email or web address] if interested in participating in this group    [place] OFFERINGS  Let's Try Pickleball-$25 for 6 weeks of Pickleball, starting February 2nd.  Contact Corwin Levins for more details.  [email or web address] NEW:  Parkinson's Social Game Night.  First Thursday of each month, 2:00-4:00 pm.  *Next date is MARCH 7th*.  [place], [employer].  Contact [email or web address] if interested. Parkinson's CarePartner Group for Men is in the works, if interested email [email or web address] ACT FITNESS Chair Yoga classes "Train and Gain", Fridays 10 am, [employer].  Contact Gina at [phone].  PWR! Moves [employer] Instructor-Led Classes offering at [employer]!  TUESDAYS and Wednesdays 1-2 pm.   Contact Vonna Kotyk at  [email or web address]  or [phone] (Tuesday classes are modified for chair and standing only) Drumming for Parkinson's will be held on 2nd and 4th Mondays at 11:00 am.   Located at the [place] ([place].)  Contact Doylene Canning at [email or web address] or [phone]  Dance for Parkinson 's classes  will be on Tuesdays 10-11 am starting in February. Located in the [employer] , in the first floor of the [employer] ([place] To register:  [email or web address] or [phone] [facility] [place] Class, Mondays at 11 am.  Call [phone] for details Moving [place].  Saturday, May 4th, 10 am start.  Register at [employer].[place]:  [email or web address]  PD Health at Home continues:  Mindfulness Mondays, Wellness Wednesdays, Fitness Fridays  (PWR! Moves as part of Fitness Fridays March 22nd, 1-1:45 pm) Upcoming Education:   Managing "Off" Periods:  Return of Parkinson's Symptoms.  Wednesday, March 20th, 1-2 pm Parkinson's 101.  Wednesday, April 3rd, 1-2 pm Expert Briefing:  Understanding Pain in Parkinson's.   Wednesday, March 13th, 1-2 pm  Research Update:  Working to [employer] PD.  Wednesday, April 10th, 1-2 pm Register for virtual education and Patent attorney (webinars) at [email or web address] Please check out their website to sign up for emails and see their full online offerings     [place]:  [email or web address]   Third Thursday Webinars:  On the third Thursday of every month at 12 p.m. ET, join our free live webinars to learn about various aspects of living with Parkinson's disease and our work to speed medical breakthroughs.  Upcoming Webinar:  Everyday Exposure to Parkinson's:  Environmental Connections to the Disease.  Thursday, March 21st at 12 noon. Check out additional information on their website to see their full online offerings    [facility]:  [email or web address]  Upcoming Webinar:   Nutrition and Parkinson's.  Wednesday, March 6th, 12 noon Webinar Series:  Living with Parkinson's Meetup.  Third Thursdays each month, 3 pm  Care Partner Monthly Meetup.  With Robin Searing Phinney.   First Tuesday of each month, 2 pm  Check out additional information to Live Well Today on their website    Parkinson and Movement Disorders (PMD) Alliance:  www.[email or web address]  [employer] Online:  Online Education Events  Sign up for emails, which are sent weekly to give you updates on programming and online offerings    Parkinson's Association of the [place]:  www.[email or web address]  Information on online support groups, education events, and online exercises including Yoga, Parkinson's exercises and more-LOTS of information on links to PD resources and online events  Virtual Support Group through Parkinson's Association of the [place]; next one is scheduled for Wednesday, March 6th  MOVEMENT AND EXERCISE OPPORTUNITIES  PWR! Moves Classes at [place].  Wednesdays 10 and 11 am.   Contact Amy Gerrit Friends, PT [email or web address] if interested.  PWR! Moves Class offerings at [employer]. *TUESDAYS* and Wednesdays 1-2 pm.    Contact Vonna Kotyk at  [email or web address]    Parkinson's Wellness Recovery (PWR! Moves)  [email or web address].[email or web address]  Info on the PWR! Virtual Experience:  You will have access to our expertise?through self-assessment, guided plans that start with the PD-specific fundamentals, educational content, tips, Q&A with an expert, and a growing Art therapist of PD-specific pre-recorded and live exercise classes of varying types and intensity - both physical and cognitive! If that is not enough, we offer 1:1 wellness consultations (in-person or virtual) to personalize your PWR! Research scientist (medical).   [place] Fridays:   As part of the PD Health @ Home program, this free video series focuses each week on one aspect of fitness designed to support people living with Parkinson's.? These weekly videos highlight the [place] fitness guidelines for people with Parkinson's disease.   [email or web address]  Dance for PD website is offering free, live-stream classes throughout the week, as well as links to [employer] of classes:  [email or web address]  Virtual dance and Pilates for Parkinson's classes: Click on the Community Tab> Parkinson's Movement Initiative Tab.  To register for classes and for more information, visit [email or web address] and click the "community" tab.   [employer] Parkinson's Cycling Classes   [employer]:  Thursdays @ Noon-Live classes at [employer] ([employer] at [email or web address]?or [phone])  [employer]: Virtual Classes Mondays and Thursdays Jeanette Caprice classes Tuesday, Wednesday and Thursday (contact [place] at [email or web address] ?or [phone])  [place]  Varied levels of classes are offered Tuesdays and Thursdays at [employer].   Stretching with Verdis Frederickson weekly class is also offered for people with Parkinson's  To observe a class or for more information, call [phone] or email Hezzie Bump at [email or web address]   ADDITIONAL SUPPORT AND RESOURCES  [employer]:Online Caregiver Education Opportunities:  www.well-[email or web address]/caregiver-education/caregiver-support-group.  You may also contact Vickki Muff at [email or web address] or [phone].     Family Caregiver [phone].  Thursday, March 7th, 10:15-1:45 at [facility].  Register with [facility] (see above) Well-Spring Navigator:  Just1Navigator program, a?free service to help individuals and families through the journey of determining care for older adults.  The "Navigator" is a Vickki Muff, Education officer, museum, who will speak with a prospective client and/or loved ones to provide an assessment of the situation and a set of recommendations for a personalized care plan -- all free of charge, and whether?[employer]  offers the needed service or not. If the need is not a service we provide, we are well-connected with reputable programs in  town that we can refer you to.  www.well-springsolutions.org or to speak with the Navigator, call [phone].

## 2022-10-05 DIAGNOSIS — I129 Hypertensive chronic kidney disease with stage 1 through stage 4 chronic kidney disease, or unspecified chronic kidney disease: Secondary | ICD-10-CM | POA: Diagnosis not present

## 2022-10-05 DIAGNOSIS — E1129 Type 2 diabetes mellitus with other diabetic kidney complication: Secondary | ICD-10-CM | POA: Diagnosis not present

## 2022-10-05 DIAGNOSIS — E785 Hyperlipidemia, unspecified: Secondary | ICD-10-CM | POA: Diagnosis not present

## 2022-10-05 DIAGNOSIS — F339 Major depressive disorder, recurrent, unspecified: Secondary | ICD-10-CM | POA: Diagnosis not present

## 2022-10-05 DIAGNOSIS — K582 Mixed irritable bowel syndrome: Secondary | ICD-10-CM | POA: Diagnosis not present

## 2022-10-05 DIAGNOSIS — G20A1 Parkinson's disease without dyskinesia, without mention of fluctuations: Secondary | ICD-10-CM | POA: Diagnosis not present

## 2022-10-05 DIAGNOSIS — M810 Age-related osteoporosis without current pathological fracture: Secondary | ICD-10-CM | POA: Diagnosis not present

## 2022-10-05 DIAGNOSIS — N1831 Chronic kidney disease, stage 3a: Secondary | ICD-10-CM | POA: Diagnosis not present

## 2022-10-05 DIAGNOSIS — M3210 Systemic lupus erythematosus, organ or system involvement unspecified: Secondary | ICD-10-CM | POA: Diagnosis not present

## 2022-10-05 DIAGNOSIS — B3731 Acute candidiasis of vulva and vagina: Secondary | ICD-10-CM | POA: Diagnosis not present

## 2022-10-05 DIAGNOSIS — E039 Hypothyroidism, unspecified: Secondary | ICD-10-CM | POA: Diagnosis not present

## 2022-10-05 DIAGNOSIS — K649 Unspecified hemorrhoids: Secondary | ICD-10-CM | POA: Diagnosis not present

## 2022-10-09 ENCOUNTER — Other Ambulatory Visit: Payer: Self-pay | Admitting: Physician Assistant

## 2022-10-09 DIAGNOSIS — M3219 Other organ or system involvement in systemic lupus erythematosus: Secondary | ICD-10-CM

## 2022-10-10 NOTE — Telephone Encounter (Signed)
Last Fill: 07/24/2022  Eye exam: 11/23/2021 WNL    Labs: 08/03/2022 CMP WNL. RBC count, hgb, and hct are low.    Next Visit: 01/01/2023  Last Visit: 08/03/2022  DX:Other systemic lupus erythematosus with other organ involvement   Current Dose per office note 08/03/2022:  Plaquenil 200 mg 1 tablet by mouth daily.   Okay to refill Plaquenil?

## 2022-10-11 ENCOUNTER — Other Ambulatory Visit: Payer: Self-pay

## 2022-10-11 DIAGNOSIS — G20A1 Parkinson's disease without dyskinesia, without mention of fluctuations: Secondary | ICD-10-CM

## 2022-10-11 MED ORDER — CARBIDOPA-LEVODOPA ER 25-100 MG PO TBCR: EXTENDED_RELEASE_TABLET | ORAL | 0 refills | Status: DC

## 2022-10-13 ENCOUNTER — Ambulatory Visit: Payer: Medicare Other

## 2022-10-13 ENCOUNTER — Encounter: Payer: Self-pay | Admitting: *Deleted

## 2022-11-20 ENCOUNTER — Ambulatory Visit
Admission: RE | Admit: 2022-11-20 | Discharge: 2022-11-20 | Disposition: A | Discharge status: Home / Self Care | Payer: Medicare Other | Source: Ambulatory Visit | Attending: Internal Medicine | Admitting: Internal Medicine

## 2022-11-20 DIAGNOSIS — Z1231 Encounter for screening mammogram for malignant neoplasm of breast: Secondary | ICD-10-CM | POA: Diagnosis not present

## 2022-11-23 ENCOUNTER — Other Ambulatory Visit: Payer: Self-pay | Admitting: Internal Medicine

## 2022-11-23 DIAGNOSIS — R928 Other abnormal and inconclusive findings on diagnostic imaging of breast: Secondary | ICD-10-CM

## 2022-12-18 NOTE — Progress Notes (Signed)
Office Visit Note  Patient: Veronica Jordan             Date of Birth: May 27, 1946           MRN: [id number]             PCP: Cleatis Polka., MD Referring: Cleatis Polka., MD Visit Date: 01/01/2023 Occupation: @GUAROCC @  Subjective:  Anxious   History of Present Illness: Veronica Jordan is a 77 y.o. female with history of systemic lupus erythematosus and sjogren's syndrome.  Patient remains on Plaquenil 200 mg 1 tablet by mouth daily.  She is tolerating Plaquenil without any side effects and has not had any interruption recently.  She denies any signs or symptoms of a systemic lupus flare.  She is not experiencing any increased joint pain or joint swelling at this time.  She continues to have chronic sicca symptoms especially dry mouth which she attributes to increased anxiety recently.  She denies any sores in her mouth or nose. Patient states that she has been experiencing increased bouts of anxiety which she attributes to life stressors.  She remains on Lexapro as prescribed.  Patient states she had an anxiety attack this past weekend and was evaluated in the emergency department.  She states that she is currently feeling anxious and on the verge of a panic attack.  Patient states that her daughter has reached out to Dr. Arbutus Leas as well as her primary care, Dr. Clelia Croft.   Activities of Daily Living:  Patient reports morning stiffness for 15-20 minutes.   Patient Denies nocturnal pain.  Difficulty dressing/grooming: Denies Difficulty climbing stairs: Reports Difficulty getting out of chair: Reports Difficulty using hands for taps, buttons, cutlery, and/or writing: Reports  Review of Systems  Constitutional:  Positive for fatigue.  HENT:  Positive for mouth dryness. Negative for mouth sores.   Eyes:  Positive for dryness.  Respiratory:  Positive for shortness of breath. Negative for cough and wheezing.   Cardiovascular:  Negative for chest pain and palpitations.  Gastrointestinal:   Positive for constipation and diarrhea. Negative for blood in stool.  Endocrine: Positive for increased urination.  Genitourinary:  Negative for painful urination and involuntary urination.  Musculoskeletal:  Positive for joint pain, gait problem, joint pain, joint swelling, myalgias, muscle weakness, morning stiffness, muscle tenderness and myalgias.  Skin:  Positive for sensitivity to sunlight. Negative for color change, rash and hair loss.  Neurological:  Positive for dizziness and light-headedness. Negative for headaches.  Hematological:  Negative for swollen glands.  Psychiatric/Behavioral:  Positive for depressed mood. Negative for sleep disturbance. The patient is nervous/anxious.     PMFS History:  Patient Active Problem List   Diagnosis Date Noted   Sjogren's syndrome with keratoconjunctivitis sicca (HCC) 08/07/2018   History of nephritis 08/07/2018   History of optic neuritis 08/07/2018   Fibromyalgia 08/07/2018   Primary osteoarthritis of both hands 08/07/2018   Primary osteoarthritis of both knees 08/07/2018   DDD (degenerative disc disease), lumbar 08/07/2018   Eosinophilic esophagitis 08/07/2018   History of IBS 08/07/2018   History of gastroesophageal reflux (GERD) 08/07/2018   History of diverticulosis 08/07/2018   History of type 2 diabetes mellitus 08/07/2018   History of kidney stones 08/07/2018   History of peripheral neuropathy 08/07/2018   Dyslipidemia 08/07/2018   Anxiety and depression 08/07/2018   Essential tremor 08/07/2018   Postmenopausal hormone replacement therapy 03/05/2015   Osteoporosis 03/05/2015   Atrophic vaginitis 03/05/2015   HYPERTHYROIDISM 12/13/2007  Hypercholesterolemia 12/13/2007   Essential hypertension 12/13/2007   ALLERGIC RHINITIS 12/13/2007   LUPUS ERYTHEMATOSUS 12/13/2007   DYSPNEA 12/13/2007    Past Medical History:  Diagnosis Date   Allergic rhinitis    Anemia    Anxiety and depression    Depression    Diabetes mellitus     Diverticulitis    Fibromyalgia    GERD (gastroesophageal reflux disease)    History of gallstones    History of kidney stones    Hyperlipemia    Hypertension    Hypothyroidism    IBS (irritable bowel syndrome)    Osteoarthritis    Osteoporosis    Renal calculi    Sjogren's syndrome (HCC)    Systemic lupus (HCC) 2001    Family History  Problem Relation Age of Onset   Asthma Mother    COPD Mother    Heart disease Father    Asthma Sister    Parkinsonism Other        MGM   Hypertension Brother    Heart attack Brother    Arthritis Maternal Grandmother    Heart disease Maternal Grandfather    Lupus Paternal Grandmother    Heart attack Paternal Grandfather    Hypertension Sister    Chronic fatigue Daughter    Colon cancer Neg Hx    Esophageal cancer Neg Hx    Stomach cancer Neg Hx    Past Surgical History:  Procedure Laterality Date   APPENDECTOMY  1970   BREAST BIOPSY Left 08/2020   BREAST EXCISIONAL BIOPSY Left    CATARACT EXTRACTION     bilateral   CESAREAN SECTION  1976, 1977   x2   CHOLECYSTECTOMY  1988   TONSILLECTOMY AND ADENOIDECTOMY     TOTAL ABDOMINAL HYSTERECTOMY  2000   BSO due to fibroids   Social History   Social History Narrative   Youngest daughter died in MVA.   Right handed    Lives in a two story home that has a basement   Immunization History  Administered Date(s) Administered   PFIZER(Purple Top)SARS-COV-2 Vaccination 08/29/2019, 09/26/2019     Objective: Vital Signs: BP 96/60 (BP Location: Left Arm, Patient Position: Sitting, Cuff Size: Normal)   Pulse 71   Resp (!) 21   Ht 4' 10.5" (1.486 m)   Wt 111 lb 12.8 oz (50.7 kg)   LMP 07/03/1998 (Approximate)   BMI 22.97 kg/m    Physical Exam Vitals and nursing note reviewed.  Constitutional:      Appearance: She is well-developed.  HENT:     Head: Normocephalic and atraumatic.  Eyes:     Conjunctiva/sclera: Conjunctivae normal.  Cardiovascular:     Rate and Rhythm: Normal  rate and regular rhythm.     Heart sounds: Normal heart sounds.  Pulmonary:     Effort: Pulmonary effort is normal.     Breath sounds: Normal breath sounds.  Abdominal:     General: Bowel sounds are normal.     Palpations: Abdomen is soft.  Musculoskeletal:     Cervical back: Normal range of motion.  Lymphadenopathy:     Cervical: No cervical adenopathy.  Skin:    General: Skin is warm and dry.     Capillary Refill: Capillary refill takes less than 2 seconds.  Neurological:     Mental Status: She is alert and oriented to person, place, and time.  Psychiatric:        Behavior: Behavior normal.      Musculoskeletal Exam: C-spine  has good range of motion with some discomfort with lateral rotation.  Limited mobility of the lumbar spine.  Shoulder joints, elbow joints, wrist joints, MCPs, PIPs, DIPs have good range of motion with no synovitis.  PIP and DIP thickening consistent with osteoarthritis of both hands.  Hip joints have good range of motion with no groin pain.  Knee joints have good range of motion with no warmth or effusion.  Ankle joints have good range of motion with no joint tenderness or synovitis. Dorsal spurs noted bilaterally.   CDAI Exam: CDAI Score: -- Patient Global: --; Provider Global: -- Swollen: --; Tender: -- Joint Exam 01/01/2023   No joint exam has been documented for this visit   There is currently no information documented on the homunculus. Go to the Rheumatology activity and complete the homunculus joint exam.  Investigation: No additional findings.  Imaging: MM Digital Diagnostic Unilat L  Result Date: 12/20/2022 CLINICAL DATA:  Screening recall for left breast calcifications. EXAM: DIGITAL DIAGNOSTIC UNILATERAL LEFT MAMMOGRAM WITH CAD TECHNIQUE: Left digital diagnostic mammography was performed. COMPARISON:  Previous exam(s). ACR Breast Density Category b: There are scattered areas of fibroglandular density. FINDINGS: There is a new, 3 mm group of  pleomorphic calcifications in the upper-outer left breast. These have an indeterminate morphology. IMPRESSION: Indeterminate left breast calcifications. RECOMMENDATION: Stereotactic biopsy of the left breast. I have discussed the findings and recommendations with the patient. If applicable, a reminder letter will be sent to the patient regarding the next appointment. BI-RADS CATEGORY  4: Suspicious. Electronically Signed   By: Sande Brothers M.D.   On: 12/20/2022 13:43   Recent Labs: Lab Results  Component Value Date   WBC 5.4 08/03/2022   HGB 10.9 (L) 08/03/2022   PLT 225 08/03/2022   NA 139 08/03/2022   K 3.8 08/03/2022   CL 105 08/03/2022   CO2 26 08/03/2022   GLUCOSE 98 08/03/2022   BUN 15 08/03/2022   CREATININE 0.94 08/03/2022   BILITOT 0.3 08/03/2022   ALKPHOS 111 01/13/2021   AST 15 08/03/2022   ALT 6 08/03/2022   PROT 7.2 08/03/2022   ALBUMIN 4.1 01/13/2021   CALCIUM 9.1 08/03/2022   GFRAA 58 (L) 08/06/2020    Speciality Comments: PLQ Eye Exam: 11/23/2021 WNL @ [facility] Follow up in 3-4 months.  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  She has clinically been doing well taking Plaquenil 200 mg 1 tablet by mouth daily.  She is tolerating Plaquenil without any side effects.  She has no synovitis on examination today.  She has not had any symptoms of Raynaud's phenomenon recently.  She has not had any oral or nasal ulcerations.  She continues to have chronic sicca symptoms.   She has been experiencing increased mouth dryness which she attributes to increased anxiety.  Patient plans on following up with her PCP Dr. Clelia Croft to discuss anxiety and panic attacks she has been experiencing.  She remains on Lexapro as prescribed. Lab work from 08/03/22 was reviewed today in the office: ESR 33, complements WNL, dsDNA 20-stable.  The  following lab work was updated today.  She will remains on plaquenil as prescribed.  She was advised to notify us if she develops signs or symptoms of a flare. - Plan: Protein / creatinine ratio, urine, CBC with Differential/Platelet, COMPLETE METABOLIC PANEL WITH GFR, Anti-DNA antibody, double-stranded, C3 and C4, Sedimentation rate, hydroxychloroquine (PLAQUENIL) 200 MG tablet  High risk medication use - Plaquenil 200 mg 1 tablet by mouth daily.  PLQ Eye Exam: 11/23/2021 WNL @ [facility].  Patient has had an updated Plaquenil eye examination--we will call to obtain these records. CBC and CMP updated on 08/03/22. Orders for CBC and CMP released today.  - Plan: CBC with Differential/Platelet, COMPLETE METABOLIC PANEL WITH GFR  History of optic neuritis - Followed by Dr. Jimmey Ralph with [facility].  No signs or symptoms of recurrence.  History of nephritis - Under care of Nephrology.  Protein creatinine ratio was within normal limits on 08/03/2022.  Total protein was 25.  Protein creatinine ratio updated today.   Sjogren's syndrome with keratoconjunctivitis sicca (HCC) - ANA+, Ro+, La+, positive RF: Patient continues to have chronic sicca symptoms.  She has been experiencing increased mouth dryness which she attributes to increased anxiety.   She remains on Plaquenil as prescribed.  She was advised to notify us if she develops any new or worsening symptoms.   Primary osteoarthritis of both hands: PIP and DIP thickening consistent with osteoarthritis of both hands.  No tenderness or inflammation noted.  Primary osteoarthritis of both knees: Good  range of motion of both knee joints on examination today.  No warmth or effusion noted.  DDD (degenerative disc disease), lumbar: Limited mobility at times.  No discomfort noted today.  No midline spinal tenderness.  Age-related osteoporosis without current pathological fracture - DEXA ordered by PCP. DEXA normal on 05/26/14.  She is taking calcium and vitamin D supplement daily.  Vitamin D deficiency: He is taking calcium and vitamin D supplement daily.  Fibromyalgia: She continues to have intermittent myalgias and muscle tenderness due to fibromyalgia.  Other medical conditions are listed as follows:  Dyslipidemia  History of IBS  Eosinophilic esophagitis  Anxiety and depression: Presents today experiencing increased anxiety.  According to the patient this past weekend she was evaluated in the emergency department on Saturday for a panic attack.  She remains on Lexapro as prescribed.  Patient was advised to reach out to Dr. Clelia Croft today.   History of type 2 diabetes mellitus  History of kidney stones  Essential hypertension: Blood pressure was 96/60 today in the office  History of gastroesophageal reflux (GERD)  History of diverticulosis  History of Parkinson's disease  Orders: Orders Placed This Encounter  Procedures   Protein / creatinine ratio, urine   CBC with Differential/Platelet   COMPLETE METABOLIC PANEL WITH GFR   Anti-DNA antibody, double-stranded   C3 and C4   Sedimentation rate   Meds ordered this encounter  Medications   hydroxychloroquine (PLAQUENIL) 200 MG tablet    Sig: TAKE 1 TABLET DAILY FOR SYSTEMIC LUPUS ERYTHEMATOSUS    Dispense:  90 tablet    Refill:  0    Follow-Up Instructions: No follow-ups on file.   Gearldine Bienenstock, PA-C  Note - This record has been created using Dragon software.  Chart creation errors have been sought, but may not always  have been located. Such creation errors do not reflect on  the standard of medical care.

## 2022-12-20 ENCOUNTER — Ambulatory Visit
Admission: RE | Admit: 2022-12-20 | Discharge: 2022-12-20 | Disposition: A | Discharge status: Home / Self Care | Payer: Medicare Other | Source: Ambulatory Visit | Attending: Internal Medicine | Admitting: Internal Medicine

## 2022-12-20 DIAGNOSIS — R921 Mammographic calcification found on diagnostic imaging of breast: Secondary | ICD-10-CM | POA: Diagnosis not present

## 2022-12-20 DIAGNOSIS — R928 Other abnormal and inconclusive findings on diagnostic imaging of breast: Secondary | ICD-10-CM

## 2022-12-21 ENCOUNTER — Other Ambulatory Visit: Payer: Self-pay | Admitting: Internal Medicine

## 2022-12-21 DIAGNOSIS — R921 Mammographic calcification found on diagnostic imaging of breast: Secondary | ICD-10-CM

## 2022-12-30 DIAGNOSIS — K589 Irritable bowel syndrome without diarrhea: Secondary | ICD-10-CM | POA: Diagnosis not present

## 2022-12-30 DIAGNOSIS — I498 Other specified cardiac arrhythmias: Secondary | ICD-10-CM | POA: Diagnosis not present

## 2022-12-30 DIAGNOSIS — F419 Anxiety disorder, unspecified: Secondary | ICD-10-CM | POA: Diagnosis not present

## 2022-12-30 DIAGNOSIS — Z79899 Other long term (current) drug therapy: Secondary | ICD-10-CM | POA: Diagnosis not present

## 2022-12-30 DIAGNOSIS — R9431 Abnormal electrocardiogram [ECG] [EKG]: Secondary | ICD-10-CM | POA: Diagnosis not present

## 2022-12-30 DIAGNOSIS — R0602 Shortness of breath: Secondary | ICD-10-CM | POA: Diagnosis not present

## 2023-01-01 ENCOUNTER — Encounter: Payer: Self-pay | Admitting: Physician Assistant

## 2023-01-01 ENCOUNTER — Ambulatory Visit: Payer: Medicare Other | Attending: Physician Assistant | Admitting: Physician Assistant

## 2023-01-01 ENCOUNTER — Telehealth: Payer: Self-pay | Admitting: Neurology

## 2023-01-01 VITALS — BP 96/60 | HR 71 | Resp 21 | Ht 58.5 in | Wt 111.8 lb

## 2023-01-01 DIAGNOSIS — Z87448 Personal history of other diseases of urinary system: Secondary | ICD-10-CM | POA: Diagnosis not present

## 2023-01-01 DIAGNOSIS — I10 Essential (primary) hypertension: Secondary | ICD-10-CM | POA: Diagnosis not present

## 2023-01-01 DIAGNOSIS — M810 Age-related osteoporosis without current pathological fracture: Secondary | ICD-10-CM

## 2023-01-01 DIAGNOSIS — Z79899 Other long term (current) drug therapy: Secondary | ICD-10-CM

## 2023-01-01 DIAGNOSIS — M797 Fibromyalgia: Secondary | ICD-10-CM

## 2023-01-01 DIAGNOSIS — Z8669 Personal history of other diseases of the nervous system and sense organs: Secondary | ICD-10-CM

## 2023-01-01 DIAGNOSIS — K200 Eosinophilic esophagitis: Secondary | ICD-10-CM

## 2023-01-01 DIAGNOSIS — F32A Depression, unspecified: Secondary | ICD-10-CM | POA: Diagnosis not present

## 2023-01-01 DIAGNOSIS — M19042 Primary osteoarthritis, left hand: Secondary | ICD-10-CM | POA: Diagnosis not present

## 2023-01-01 DIAGNOSIS — Z87442 Personal history of urinary calculi: Secondary | ICD-10-CM

## 2023-01-01 DIAGNOSIS — M5136 Other intervertebral disc degeneration, lumbar region: Secondary | ICD-10-CM | POA: Diagnosis not present

## 2023-01-01 DIAGNOSIS — M3219 Other organ or system involvement in systemic lupus erythematosus: Secondary | ICD-10-CM | POA: Diagnosis not present

## 2023-01-01 DIAGNOSIS — M170 Bilateral primary osteoarthritis of knee: Secondary | ICD-10-CM

## 2023-01-01 DIAGNOSIS — E785 Hyperlipidemia, unspecified: Secondary | ICD-10-CM

## 2023-01-01 DIAGNOSIS — E559 Vitamin D deficiency, unspecified: Secondary | ICD-10-CM

## 2023-01-01 DIAGNOSIS — Z8639 Personal history of other endocrine, nutritional and metabolic disease: Secondary | ICD-10-CM | POA: Diagnosis not present

## 2023-01-01 DIAGNOSIS — M3501 Sicca syndrome with keratoconjunctivitis: Secondary | ICD-10-CM | POA: Diagnosis not present

## 2023-01-01 DIAGNOSIS — Z8719 Personal history of other diseases of the digestive system: Secondary | ICD-10-CM

## 2023-01-01 DIAGNOSIS — F419 Anxiety disorder, unspecified: Secondary | ICD-10-CM | POA: Insufficient documentation

## 2023-01-01 DIAGNOSIS — M51369 Other intervertebral disc degeneration, lumbar region without mention of lumbar back pain or lower extremity pain: Secondary | ICD-10-CM

## 2023-01-01 DIAGNOSIS — M19041 Primary osteoarthritis, right hand: Secondary | ICD-10-CM

## 2023-01-01 LAB — CBC WITH DIFFERENTIAL/PLATELET
Basophils Relative: 0.9 %
Eosinophils Relative: 2.5 %
MCHC: 31.7 g/dL — ABNORMAL LOW (ref 32.0–36.0)
MPV: 10.8 fL (ref 7.5–12.5)
Neutro Abs: 5483 cells/uL (ref 1500–7800)

## 2023-01-01 MED ORDER — HYDROXYCHLOROQUINE SULFATE 200 MG PO TABS
ORAL_TABLET | ORAL | 0 refills | Status: AC
Start: 2023-01-01 — End: ?

## 2023-01-01 NOTE — Telephone Encounter (Signed)
Called daughter back and gave Dr. Iona Beard recommendations

## 2023-01-01 NOTE — Telephone Encounter (Signed)
Called patients daughter she said patient was fine just sitting in her chair and started screaming help me help me and her whole body was shaking and she was still able to communicate. Patients husband took her to the ED  They told patient she had af few things going on but mostly anxiety. They did say she had low potassium and protein in her kidneys. Patient is currently taking carbidopa levodopa 2 tab TID and the ED prescribed her  Dicyclomine , microbid , and klor chon potassium chloride  Patients daughter believes it was a parkinson's attack

## 2023-01-01 NOTE — Telephone Encounter (Signed)
Pt has a Parkinson attack on sat and her whole body was shaking almost like having a seizure but patient could talk during the attack. Please call

## 2023-01-02 ENCOUNTER — Other Ambulatory Visit: Payer: Self-pay

## 2023-01-02 DIAGNOSIS — G20A1 Parkinson's disease without dyskinesia, without mention of fluctuations: Secondary | ICD-10-CM

## 2023-01-02 LAB — COMPLETE METABOLIC PANEL WITH GFR
AG Ratio: 1.5 (calc) (ref 1.0–2.5)
ALT: 8 U/L (ref 6–29)
AST: 17 U/L (ref 10–35)
Albumin: 4.2 g/dL (ref 3.6–5.1)
Alkaline phosphatase (APISO): 78 U/L (ref 37–153)
BUN/Creatinine Ratio: 16 (calc) (ref 6–22)
BUN: 16 mg/dL (ref 7–25)
CO2: 20 mmol/L (ref 20–32)
Calcium: 9.2 mg/dL (ref 8.6–10.4)
Chloride: 104 mmol/L (ref 98–110)
Creat: 1.03 mg/dL — ABNORMAL HIGH (ref 0.60–1.00)
Globulin: 2.8 g/dL (calc) (ref 1.9–3.7)
Glucose, Bld: 153 mg/dL — ABNORMAL HIGH (ref 65–99)
Potassium: 3.8 mmol/L (ref 3.5–5.3)
Sodium: 138 mmol/L (ref 135–146)
Total Bilirubin: 0.5 mg/dL (ref 0.2–1.2)
Total Protein: 7 g/dL (ref 6.1–8.1)
eGFR: 57 mL/min/{1.73_m2} — ABNORMAL LOW (ref >=60)

## 2023-01-02 LAB — CBC WITH DIFFERENTIAL/PLATELET
Absolute Monocytes: 518 {cells}/uL (ref 200–950)
Basophils Absolute: 68 cells/uL (ref 0–200)
Eosinophils Absolute: 188 {cells}/uL (ref 15–500)
HCT: 34.4 % — ABNORMAL LOW (ref 35.0–45.0)
Hemoglobin: 10.9 g/dL — ABNORMAL LOW (ref 11.7–15.5)
Lymphs Abs: 1245 {cells}/uL (ref 850–3900)
MCH: 29.1 pg (ref 27.0–33.0)
MCV: 91.7 fL (ref 80.0–100.0)
Monocytes Relative: 6.9 %
Neutrophils Relative %: 73.1 %
Platelets: 247 10*3/uL (ref 140–400)
RBC: 3.75 10*6/uL — ABNORMAL LOW (ref 3.80–5.10)
RDW: 12.7 % (ref 11.0–15.0)
Total Lymphocyte: 16.6 %
WBC: 7.5 10*3/uL (ref 3.8–10.8)

## 2023-01-02 LAB — C3 AND C4
C3 Complement: 154 mg/dL (ref 83–193)
C4 Complement: 20 mg/dL (ref 15–57)

## 2023-01-02 LAB — PROTEIN / CREATININE RATIO, URINE
Creatinine, Urine: 429 mg/dL — ABNORMAL HIGH (ref 20–275)
Protein/Creat Ratio: 152 mg/g creat (ref 24–184)
Protein/Creatinine Ratio: 0.152 mg/mg creat (ref 0.024–0.184)
Total Protein, Urine: 65 mg/dL — ABNORMAL HIGH (ref 5–24)

## 2023-01-02 LAB — ANTI-DNA ANTIBODY, DOUBLE-STRANDED: ds DNA Ab: 12 IU/mL — ABNORMAL HIGH

## 2023-01-02 LAB — SEDIMENTATION RATE: Sed Rate: 31 mm/h — ABNORMAL HIGH (ref 0–30)

## 2023-01-02 MED ORDER — CARBIDOPA-LEVODOPA ER 25-100 MG PO TBCR
EXTENDED_RELEASE_TABLET | ORAL | 0 refills | Status: DC
Start: 2023-01-02 — End: 2023-04-18

## 2023-01-03 NOTE — Progress Notes (Signed)
Complements WNL. ESR remains elevated but stable.  dsDNA is borderline positive but continues to trend down. Labs are not consistent with a flare.  RBC count, hgb, and hct remain low but stable.  Glucose is 153. Creatinine is elevated-1.03 and GFR is low 57.  Please encourage patient to increase oral hydration.  She is currently being treated for a UTI.  Total urine protein is elevated but protein creatinine ratio is WNL. Please forward to nephrology as requested.

## 2023-01-15 DIAGNOSIS — E1149 Type 2 diabetes mellitus with other diabetic neurological complication: Secondary | ICD-10-CM | POA: Diagnosis not present

## 2023-01-15 DIAGNOSIS — F339 Major depressive disorder, recurrent, unspecified: Secondary | ICD-10-CM | POA: Diagnosis not present

## 2023-01-15 DIAGNOSIS — E1129 Type 2 diabetes mellitus with other diabetic kidney complication: Secondary | ICD-10-CM | POA: Diagnosis not present

## 2023-01-15 DIAGNOSIS — F411 Generalized anxiety disorder: Secondary | ICD-10-CM | POA: Diagnosis not present

## 2023-01-15 DIAGNOSIS — G20A1 Parkinson's disease without dyskinesia, without mention of fluctuations: Secondary | ICD-10-CM | POA: Diagnosis not present

## 2023-01-17 ENCOUNTER — Emergency Department (HOSPITAL_COMMUNITY)
Admission: EM | Admit: 2023-01-17 | Discharge: 2023-01-18 | Disposition: A | Discharge status: Home / Self Care | Payer: Medicare Other | Attending: Emergency Medicine | Admitting: Emergency Medicine

## 2023-01-17 DIAGNOSIS — F419 Anxiety disorder, unspecified: Secondary | ICD-10-CM | POA: Diagnosis present

## 2023-01-17 DIAGNOSIS — R251 Tremor, unspecified: Secondary | ICD-10-CM | POA: Insufficient documentation

## 2023-01-17 DIAGNOSIS — K838 Other specified diseases of biliary tract: Secondary | ICD-10-CM | POA: Diagnosis not present

## 2023-01-17 DIAGNOSIS — Z7982 Long term (current) use of aspirin: Secondary | ICD-10-CM | POA: Diagnosis not present

## 2023-01-17 DIAGNOSIS — F410 Panic disorder [episodic paroxysmal anxiety] without agoraphobia: Secondary | ICD-10-CM | POA: Diagnosis not present

## 2023-01-17 DIAGNOSIS — E876 Hypokalemia: Secondary | ICD-10-CM | POA: Diagnosis not present

## 2023-01-17 DIAGNOSIS — I700 Atherosclerosis of aorta: Secondary | ICD-10-CM | POA: Diagnosis not present

## 2023-01-17 DIAGNOSIS — M329 Systemic lupus erythematosus, unspecified: Secondary | ICD-10-CM | POA: Diagnosis not present

## 2023-01-17 DIAGNOSIS — K5790 Diverticulosis of intestine, part unspecified, without perforation or abscess without bleeding: Secondary | ICD-10-CM | POA: Insufficient documentation

## 2023-01-17 DIAGNOSIS — R110 Nausea: Secondary | ICD-10-CM | POA: Insufficient documentation

## 2023-01-17 DIAGNOSIS — R918 Other nonspecific abnormal finding of lung field: Secondary | ICD-10-CM | POA: Insufficient documentation

## 2023-01-17 DIAGNOSIS — K449 Diaphragmatic hernia without obstruction or gangrene: Secondary | ICD-10-CM | POA: Insufficient documentation

## 2023-01-17 DIAGNOSIS — Z9049 Acquired absence of other specified parts of digestive tract: Secondary | ICD-10-CM | POA: Diagnosis not present

## 2023-01-17 DIAGNOSIS — G20B2 Parkinson's disease with dyskinesia, with fluctuations: Secondary | ICD-10-CM | POA: Diagnosis not present

## 2023-01-17 DIAGNOSIS — R112 Nausea with vomiting, unspecified: Secondary | ICD-10-CM | POA: Diagnosis not present

## 2023-01-17 DIAGNOSIS — I129 Hypertensive chronic kidney disease with stage 1 through stage 4 chronic kidney disease, or unspecified chronic kidney disease: Secondary | ICD-10-CM | POA: Diagnosis not present

## 2023-01-17 DIAGNOSIS — R9431 Abnormal electrocardiogram [ECG] [EKG]: Secondary | ICD-10-CM | POA: Diagnosis not present

## 2023-01-17 DIAGNOSIS — K429 Umbilical hernia without obstruction or gangrene: Secondary | ICD-10-CM | POA: Diagnosis not present

## 2023-01-17 DIAGNOSIS — R809 Proteinuria, unspecified: Secondary | ICD-10-CM | POA: Diagnosis not present

## 2023-01-17 DIAGNOSIS — R1011 Right upper quadrant pain: Secondary | ICD-10-CM | POA: Diagnosis not present

## 2023-01-17 DIAGNOSIS — K5730 Diverticulosis of large intestine without perforation or abscess without bleeding: Secondary | ICD-10-CM | POA: Diagnosis not present

## 2023-01-17 DIAGNOSIS — E1122 Type 2 diabetes mellitus with diabetic chronic kidney disease: Secondary | ICD-10-CM | POA: Diagnosis not present

## 2023-01-17 LAB — CBC
HCT: 34.7 % — ABNORMAL LOW (ref 36.0–46.0)
Hemoglobin: 11 g/dL — ABNORMAL LOW (ref 12.0–15.0)
MCH: 29.3 pg (ref 26.0–34.0)
MCHC: 31.7 g/dL (ref 30.0–36.0)
MCV: 92.5 fL (ref 80.0–100.0)
Platelets: 272 10*3/uL (ref 150–400)
RBC: 3.75 MIL/uL — ABNORMAL LOW (ref 3.87–5.11)
RDW: 13.1 % (ref 11.5–15.5)
WBC: 9.1 10*3/uL (ref 4.0–10.5)
nRBC: 0 % (ref 0.0–0.2)

## 2023-01-17 LAB — LIPASE, BLOOD: Lipase: 35 U/L (ref 11–51)

## 2023-01-17 LAB — COMPREHENSIVE METABOLIC PANEL
ALT: <5 U/L (ref 0–44)
AST: 19 U/L (ref 15–41)
Albumin: 4 g/dL (ref 3.5–5.0)
Alkaline Phosphatase: 70 U/L (ref 38–126)
Anion gap: 18 — ABNORMAL HIGH (ref 5–15)
BUN: 12 mg/dL (ref 8–23)
CO2: 19 mmol/L — ABNORMAL LOW (ref 22–32)
Calcium: 9.3 mg/dL (ref 8.9–10.3)
Chloride: 101 mmol/L (ref 98–111)
Creatinine, Ser: 1.21 mg/dL — ABNORMAL HIGH (ref 0.44–1.00)
GFR, Estimated: 47 mL/min — ABNORMAL LOW (ref >60)
Glucose, Bld: 132 mg/dL — ABNORMAL HIGH (ref 70–99)
Potassium: 3 mmol/L — ABNORMAL LOW (ref 3.5–5.1)
Sodium: 138 mmol/L (ref 135–145)
Total Bilirubin: 0.4 mg/dL (ref 0.3–1.2)
Total Protein: 7.2 g/dL (ref 6.5–8.1)

## 2023-01-17 MED ORDER — METOCLOPRAMIDE HCL 5 MG/ML IJ SOLN
10.0000 mg | Freq: Once | INTRAMUSCULAR | Status: AC
  Administered 2023-01-18: 10 mg via INTRAVENOUS
  Filled 2023-01-17: qty 2

## 2023-01-17 MED ORDER — SODIUM CHLORIDE 0.9 % IV BOLUS
500.0000 mL | Freq: Once | INTRAVENOUS | Status: AC
  Administered 2023-01-18: 500 mL via INTRAVENOUS

## 2023-01-17 MED ORDER — ALPRAZOLAM 0.25 MG PO TABS
0.5000 mg | ORAL_TABLET | Freq: Once | ORAL | Status: DC
  Filled 2023-01-17: qty 2

## 2023-01-17 NOTE — ED Triage Notes (Signed)
Pt reports worsening N/V/D for two weeks. Pt has hx of parkinson's disease and Lupus.

## 2023-01-17 NOTE — ED Provider Notes (Signed)
[place] EMERGENCY DEPARTMENT AT [facility] Provider Note   CSN: [id number] Arrival date & time: 01/17/23  1800     History  Chief Complaint  Patient presents with   Nausea   Diarrhea   Anxiety    Veronica Jordan is a 77 y.o. female.  Presents to the emergency department stating that she has not been feeling well for a couple of weeks.  She has been seen by multiple specialists as well as in the emergency department.  Was recently diagnosed with UTI but did not take the antibiotics because "they made me feel worse".  Patient presents tonight because she has not been able to eat for couple of weeks, reports that food makes her nauseated and if she eats she vomits.  She vomited multiple times tonight.       Home Medications Prior to Admission medications   Medication Sig Start Date End Date Taking? Authorizing Provider  ALPRAZolam (XANAX) 0.25 MG tablet Take 1 tablet (0.25 mg total) by mouth 2 (two) times daily as needed for anxiety. 01/18/23  Yes Leila Schuff, Canary Brim, MD  albuterol (VENTOLIN HFA) 108 (90 Base) MCG/ACT inhaler SMARTSIG:2 Inhalation Via Inhaler Every 4 Hours Patient not taking: Reported on 01/01/2023 12/27/19   [provider]  amLODipine (NORVASC) 10 MG tablet Take 10 mg by mouth daily.    [provider]  aspirin 81 MG tablet Take 81 mg by mouth daily.    [provider]  brimonidine (ALPHAGAN P) 0.1 % SOLN  11/19/20   [provider]  Calcium Carbonate-Vitamin D 600-400 MG-UNIT tablet Take 1 tablet by mouth daily.    [provider]  Carbidopa-Levodopa ER (SINEMET CR) 25-100 MG tablet controlled release Take 2 tablets 3 times per day (she takes at 10am/2pm/6pm) 01/02/23   Tat, Octaviano Batty, DO  Coenzyme Q10 (COQ-10) 50 MG CAPS Take 1 capsule by mouth daily.    [provider]  dicyclomine (BENTYL) 20 MG tablet Take 20 mg by mouth every 6 (six) hours.    [provider]  DIOVAN 320 MG tablet Take  320 mg by mouth daily. Patient not taking: Reported on 01/01/2023 02/18/19   [provider]  escitalopram (LEXAPRO) 10 MG tablet Take 10 mg by mouth daily.  05/24/18   [provider]  fluticasone (FLONASE) 50 MCG/ACT nasal spray as needed. 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    [provider]  loratadine (CLARITIN) 10 MG tablet Take 10 mg by mouth daily.    [provider]  losartan (COZAAR) 100 MG tablet Take 100 mg by mouth daily.    [provider]  nitrofurantoin, macrocrystal-monohydrate, (MACROBID) 100 MG capsule Take 100 mg by mouth 2 (two) times daily.    [provider]  Omega-3 Fatty Acids (OMEGA 3 PO) Take by mouth daily.    [provider]  pantoprazole (PROTONIX) 40 MG tablet Take 40 mg by mouth daily.  05/24/18   [provider]  potassium chloride SA (KLOR-CON M) 20 MEQ tablet Take 20 mEq by mouth daily.    [provider]  simvastatin (ZOCOR) 20 MG tablet Take 20 mg by mouth at bedtime.    [provider]  triamcinolone cream (KENALOG) 0.1 % Apply topically as needed. 05/10/22  [provider]  valsartan (DIOVAN) 320 MG tablet Take 320 mg by mouth daily.    [provider]  vitamin B-12 (CYANOCOBALAMIN) 1000 MCG tablet Take 1,000 mcg by mouth daily.    [provider]      Allergies    Clindamycin/lincomycin, Codeine, Demerol [meperidine], Erythromycin, Iodinated contrast media, Meperidine hcl, Ondansetron hcl, Penicillins, Sulfonamide derivatives, and Zofran [ondansetron hcl]    Review of Systems   Review of Systems  Physical Exam Updated Vital Signs BP (!) 125/47   Pulse 71   Temp 98.5 F (36.9 C) (Oral)   Resp (!) 23   LMP 07/03/1998 (Approximate)    SpO2 97%  Physical Exam Vitals and nursing note reviewed.  Constitutional:      General: She is not in acute distress.    Appearance: She is well-developed.  HENT:     Head: Normocephalic and atraumatic.     Mouth/Throat:     Mouth: Mucous membranes are moist.  Eyes:     General: Vision grossly intact. Gaze aligned appropriately.     Extraocular Movements: Extraocular movements intact.     Conjunctiva/sclera: Conjunctivae normal.  Cardiovascular:     Rate and Rhythm: Normal rate and regular rhythm.     Pulses: Normal pulses.     Heart sounds: Normal heart sounds, S1 normal and S2 normal. No murmur heard.    No friction rub. No gallop.  Pulmonary:     Effort: Pulmonary effort is normal. No respiratory distress.     Breath sounds: Normal breath sounds.  Abdominal:     General: Bowel sounds are normal.     Palpations: Abdomen is soft.     Tenderness: There is no abdominal tenderness. There is no guarding or rebound.     Hernia: No hernia is present.  Musculoskeletal:        General: No swelling.     Cervical back: Full passive range of motion without pain, normal range of motion and neck supple. No spinous process tenderness or muscular tenderness. Normal range of motion.     Right lower leg: No edema.     Left lower leg: No edema.  Skin:    General: Skin is warm and dry.     Capillary Refill: Capillary refill takes less than 2 seconds.     Findings: No ecchymosis, erythema, rash or wound.  Neurological:     General: No focal deficit present.     Mental Status: She is alert and oriented to person, place, and time.     GCS: GCS eye subscore is 4. GCS verbal subscore is 5. GCS motor subscore is 6.     Cranial Nerves: Cranial nerves 2-12 are intact.     Sensory: Sensation is intact.     Motor: Tremor present.     Coordination: Coordination is intact.  Psychiatric:        Attention and Perception: Attention normal.        Mood and Affect: Mood normal.        Speech: Speech  normal.        Behavior: Behavior normal.     ED Results / Procedures / Treatments   Labs (all labs ordered are listed, but only abnormal results are displayed) Labs Reviewed  COMPREHENSIVE METABOLIC PANEL - Abnormal; Notable for the following components:      Result Value   Potassium 3.0 (*)    CO2 19 (*)    Glucose, Bld 132 (*)    Creatinine,  Ser 1.21 (*)    GFR, Estimated 47 (*)    Anion gap 18 (*)    All other components within normal limits  CBC - Abnormal; Notable for the following components:   RBC 3.75 (*)    Hemoglobin 11.0 (*)    HCT 34.7 (*)    All other components within normal limits  URINALYSIS, ROUTINE W REFLEX MICROSCOPIC - Abnormal; Notable for the following components:   APPearance HAZY (*)    Ketones, ur 5 (*)    Leukocytes,Ua MODERATE (*)    Bacteria, UA MANY (*)    All other components within normal limits  LIPASE, BLOOD    EKG EKG Interpretation Date/Time:  Wednesday January 17 2023 18:22:50 EDT Ventricular Rate:  72 PR Interval:  130 QRS Duration:  84 QT Interval:  430 QTC Calculation: 470 R Axis:   65  Text Interpretation: Normal sinus rhythm Nonspecific ST abnormality Abnormal ECG When compared with ECG of 20-Aug-2004 19:37, no change Confirmed by Gilda Crease [phone]) on 01/17/2023 11:16:23 PM  Radiology US ABDOMEN LIMITED RUQ (LIVER/GB)  Result Date: 01/18/2023 CLINICAL DATA:  Right upper quadrant pain EXAM: ULTRASOUND ABDOMEN LIMITED RIGHT UPPER QUADRANT COMPARISON:  None Available. FINDINGS: Gallbladder: Surgically absent Common bile duct: Diameter: 13 mm.  There is intrahepatic biliary dilatation. Liver: No focal lesion identified. Within normal limits in parenchymal echogenicity. Portal vein is patent on color Doppler imaging with normal direction of blood flow towards the liver. Other: None. IMPRESSION: 1. Status post cholecystectomy. 2. There is intrahepatic and extrahepatic biliary dilatation. The common bile duct measures 13 mm.  Electronically Signed   By: Deatra Robinson M.D.   On: 01/18/2023 02:54   CT ABDOMEN PELVIS WO CONTRAST  Result Date: 01/18/2023 CLINICAL DATA:  Abdominal pain with worsening nausea, vomiting, and diarrhea for 2 weeks. EXAM: CT ABDOMEN AND PELVIS WITHOUT CONTRAST TECHNIQUE: Multidetector CT imaging of the abdomen and pelvis was performed following the standard protocol without IV contrast. RADIATION DOSE REDUCTION: This exam was performed according to the departmental dose-optimization program which includes automated exposure control, adjustment of the mA and/or kV according to patient size and/or use of iterative reconstruction technique. COMPARISON:  05/27/2018. FINDINGS: Lower chest: Scattered pulmonary nodules are noted at the lung bases measuring up to 5 mm in the right lower lobe, axial image 5. Hepatobiliary: No focal liver abnormality is seen. The gallbladder is not seen. The common bile duct is distended measuring 1.2 cm, new from the previous exam. Pancreas: Unremarkable. No pancreatic ductal dilatation or surrounding inflammatory changes. Spleen: Normal in size without focal abnormality. Adrenals/Urinary Tract: No adrenal nodule or mass. No renal calculus or hydronephrosis bilaterally. The bladder is unremarkable. Stomach/Bowel: There is a small hiatal hernia. Stomach is within normal limits. Appendix is not seen. No evidence of bowel wall thickening, distention, or inflammatory changes. No free air or pneumatosis. Scattered diverticula are present along the sigmoid colon without evidence of diverticulitis. Vascular/Lymphatic: Aortic atherosclerosis. No enlarged abdominal or pelvic lymph nodes. Reproductive: Status post hysterectomy. No adnexal masses. Other: No abdominopelvic ascites. Small fat containing umbilical hernia is present. Musculoskeletal: Degenerative changes are present in the thoracolumbar spine. No acute osseous abnormality. IMPRESSION: 1. Dilatation of the common bile duct measuring  1.2 cm. The gallbladder is not visualized on exam. Ultrasound is recommended for further evaluation. 2. Diverticulosis without diverticulitis. 3. Small hiatal hernia. 4. Aortic atherosclerosis. 5. Pulmonary nodules at the lung bases measuring up to 5 mm. No follow-up needed if patient is low-risk (  and has no known or suspected primary neoplasm). Non-contrast chest CT can be considered in 12 months if patient is high-risk. This recommendation follows the consensus statement: Guidelines for Management of Incidental Pulmonary Nodules Detected on CT Images: From the Fleischner Society 2017; Radiology 2017; 284:228-243. Electronically Signed   By: Thornell Sartorius M.D.   On: 01/18/2023 02:00    Procedures Procedures    Medications Ordered in ED Medications  ALPRAZolam Prudy Feeler) tablet 0.5 mg (0.5 mg Oral Not Given 01/18/23 0040)  sodium chloride 0.9 % bolus 500 mL (0 mLs Intravenous Stopped 01/18/23 0203)  metoCLOPramide (REGLAN) injection 10 mg (10 mg Intravenous Given 01/18/23 0035)  benztropine mesylate (COGENTIN) injection 1 mg (1 mg Intravenous Given 01/18/23 0107)  diazepam (VALIUM) injection 2.5 mg (2.5 mg Intravenous Given 01/18/23 0100)    ED Course/ Medical Decision Making/ A&P                             Medical Decision Making Amount and/or Complexity of Data Reviewed Labs: ordered. Radiology: ordered.  Risk Prescription drug management.   Differential Diagnosis considered includes, but not limited to: Cholelithiasis; cholecystitis; cholangitis; bowel obstruction; esophagitis; gastritis; peptic ulcer disease; pancreatitis; cardiac.  Presents to the emergency department for evaluation of severe anxiety.  She has been experiencing intermittent episodes of upper abdominal discomfort with nausea and vomiting as well.  This has been ongoing for a couple of weeks.  She has not been able to eat or drink much because of this.  Patient severely anxious at arrival.  She is complaining of  persistent nausea.  She was given Reglan because of other allergies.  Unfortunately, she did have some dystonic reaction to this, this completely resolved after administration of Cogentin.  Patient also administered benzodiazepine to help with her anxiety which significantly improved.  Lab work is normal.  Vital signs are unremarkable.  Patient underwent CT scan which showed dilatation of the intrahepatic and extrahepatic ducts.  They recommended ultrasound which was performed.  No acute abnormality is noted.  She is status postcholecystectomy in 1988.  These are likely chronic findings.  No upper abdominal tenderness, no right upper quadrant tenderness.  No LFT abnormality.  Will refer back to [facility] who she has seen before for her nausea and difficulty with eating.  Prescribed Xanax to help with her anxiety.        Final Clinical Impression(s) / ED Diagnoses Final diagnoses:  Panic attack  Nausea    Rx / DC Orders ED Discharge Orders          Ordered    ALPRAZolam (XANAX) 0.25 MG tablet  2 times daily PRN        01/18/23 0707    Ambulatory referral to Gastroenterology        01/18/23 0707              Gilda Crease, MD 01/18/23 [phone]

## 2023-01-17 NOTE — ED Provider Notes (Incomplete)
[place] EMERGENCY DEPARTMENT AT [facility] Provider Note   CSN: [id number] Arrival date & time: 01/17/23  1800     History {Add pertinent medical, surgical, social history, OB history to HPI:1} Chief Complaint  Patient presents with  . Nausea  . Diarrhea  . Anxiety    Veronica Jordan is a 77 y.o. female.  Presents to the emergency department stating that she has not been feeling well for a couple of weeks.  She has been seen by multiple specialists as well as in the emergency department.  Was recently diagnosed with UTI but did not take the antibiotics because "they made me feel worse".  Patient presents tonight because she has not been able to eat for couple of weeks, reports that food makes her nauseated and if she eats she vomits.  She vomited multiple times tonight.       Home Medications Prior to Admission medications   Medication Sig Start Date End Date Taking? Authorizing Provider  albuterol (VENTOLIN HFA) 108 (90 Base) MCG/ACT inhaler SMARTSIG:2 Inhalation Via Inhaler Every 4 Hours Patient not taking: Reported on 01/01/2023 12/27/19   [provider]  amLODipine (NORVASC) 10 MG tablet Take 10 mg by mouth daily.    [provider]  aspirin 81 MG tablet Take 81 mg by mouth daily.    [provider]  brimonidine (ALPHAGAN P) 0.1 % SOLN  11/19/20   [provider]  Calcium Carbonate-Vitamin D 600-400 MG-UNIT tablet Take 1 tablet by mouth daily.    [provider]  Carbidopa-Levodopa ER (SINEMET CR) 25-100 MG tablet controlled release Take 2 tablets 3 times per day (she takes at 10am/2pm/6pm) 01/02/23   Tat, Octaviano Batty, DO  Coenzyme Q10 (COQ-10) 50 MG CAPS Take 1 capsule by mouth daily.    [provider]  dicyclomine (BENTYL) 20 MG tablet Take 20 mg by mouth every 6 (six) hours.    [provider]  DIOVAN 320 MG tablet Take 320 mg by mouth daily. Patient not taking: Reported on 01/01/2023 02/18/19   [provider]  escitalopram (LEXAPRO) 10 MG tablet Take 10 mg by mouth daily.  05/24/18   [provider]  fluticasone (FLONASE) 50 MCG/ACT nasal spray as needed. 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    [provider]  loratadine (CLARITIN) 10 MG tablet Take 10 mg by mouth daily.    [provider]  losartan (COZAAR) 100 MG tablet Take 100 mg by mouth daily.    [provider]  nitrofurantoin, macrocrystal-monohydrate, (MACROBID) 100 MG capsule Take 100 mg by mouth 2 (two) times daily.    [provider]  Omega-3 Fatty Acids (OMEGA 3 PO) Take by mouth daily.    [provider]  pantoprazole (PROTONIX) 40 MG tablet Take 40 mg by mouth daily.  05/24/18   [provider]  potassium chloride SA (KLOR-CON M) 20 MEQ tablet Take 20 mEq by mouth daily.    [provider]  simvastatin (ZOCOR) 20 MG tablet Take 20 mg by mouth at bedtime.    [provider]  triamcinolone cream (KENALOG) 0.1 % Apply topically as needed. 05/10/22   [provider]  valsartan (DIOVAN) 320 MG tablet Take 320 mg by mouth daily.    [provider]  vitamin B-12 (CYANOCOBALAMIN) 1000 MCG tablet Take 1,000 mcg by mouth daily.    [provider]      Allergies    Clindamycin/lincomycin, Codeine, Demerol [meperidine], Erythromycin, Iodinated contrast media, Meperidine hcl, Ondansetron hcl, Penicillins, Sulfonamide derivatives, and Zofran [ondansetron hcl]    Review of Systems   Review of Systems  Physical Exam Updated Vital Signs BP 135/71 (BP Location: Right Arm)   Pulse 68   Temp 98.1 F (36.7 C) (Oral)   Resp 18   LMP 07/03/1998 (Approximate)   SpO2 100%  Physical Exam Vitals and nursing note reviewed.   Constitutional:      General: She is not in acute distress.    Appearance: She is well-developed.  HENT:     Head: Normocephalic and atraumatic.     Mouth/Throat:     Mouth: Mucous membranes are moist.  Eyes:     General: Vision grossly intact. Gaze aligned appropriately.     Extraocular Movements: Extraocular movements intact.     Conjunctiva/sclera: Conjunctivae normal.  Cardiovascular:     Rate and Rhythm: Normal rate and regular rhythm.     Pulses: Normal pulses.     Heart sounds: Normal heart sounds, S1 normal and S2 normal. No murmur heard.    No friction rub. No gallop.  Pulmonary:     Effort: Pulmonary effort is normal. No respiratory distress.     Breath sounds: Normal breath sounds.  Abdominal:     General: Bowel sounds are normal.     Palpations: Abdomen is soft.     Tenderness: There is no abdominal tenderness. There is no guarding or rebound.     Hernia: No hernia is present.  Musculoskeletal:        General: No swelling.     Cervical back: Full passive range of motion without pain, normal range of motion and neck supple. No spinous process tenderness or muscular tenderness. Normal range of motion.     Right lower leg: No edema.     Left lower leg: No edema.  Skin:    General: Skin is warm and dry.     Capillary Refill: Capillary refill takes less than 2 seconds.     Findings: No ecchymosis, erythema, rash or wound.  Neurological:     General: No focal deficit present.     Mental Status: She is alert and oriented to person, place, and time.     GCS: GCS eye subscore is 4. GCS verbal subscore is 5. GCS motor subscore is 6.     Cranial Nerves: Cranial nerves 2-12 are intact.     Sensory: Sensation is intact.     Motor: Tremor present.     Coordination: Coordination is intact.  Psychiatric:        Attention and Perception: Attention normal.        Mood and Affect: Mood normal.        Speech: Speech normal.        Behavior: Behavior normal.     ED Results /  Procedures / Treatments   Labs (all labs ordered are listed, but only abnormal results are displayed) Labs Reviewed  COMPREHENSIVE METABOLIC PANEL - Abnormal; Notable for the following components:      Result Value   Potassium 3.0 (*)    CO2 19 (*)    Glucose, Bld 132 (*)    Creatinine, Ser 1.21 (*)    GFR, Estimated 47 (*)    Anion gap 18 (*)  All other components within normal limits  CBC - Abnormal; Notable for the following components:   RBC 3.75 (*)    Hemoglobin 11.0 (*)    HCT 34.7 (*)    All other components within normal limits  LIPASE, BLOOD  URINALYSIS, ROUTINE W REFLEX MICROSCOPIC    EKG EKG Interpretation Date/Time:  Wednesday January 17 2023 18:22:50 EDT Ventricular Rate:  72 PR Interval:  130 QRS Duration:  84 QT Interval:  430 QTC Calculation: 470 R Axis:   65  Text Interpretation: Normal sinus rhythm Nonspecific ST abnormality Abnormal ECG When compared with ECG of 20-Aug-2004 19:37, no change Confirmed by Gilda Crease [phone]) on 01/17/2023 11:16:23 PM  Radiology No results found.  Procedures Procedures  {Document cardiac monitor, telemetry assessment procedure when appropriate:1}  Medications Ordered in ED Medications  ALPRAZolam (XANAX) tablet 0.5 mg (has no administration in time range)  sodium chloride 0.9 % bolus 500 mL (has no administration in time range)  metoCLOPramide (REGLAN) injection 10 mg (has no administration in time range)    ED Course/ Medical Decision Making/ A&P   {   Click here for ABCD2, HEART and other calculatorsREFRESH Note before signing :1}                          Medical Decision Making Amount and/or Complexity of Data Reviewed Labs: ordered. Radiology: ordered.  Risk Prescription drug management.   ***  {Document critical care time when appropriate:1} {Document review of labs and clinical decision tools ie heart score, Chads2Vasc2 etc:1}  {Document your independent review of radiology images, and  any outside records:1} {Document your discussion with family members, caretakers, and with consultants:1} {Document social determinants of health affecting pt's care:1} {Document your decision making why or why not admission, treatments were needed:1} Final Clinical Impression(s) / ED Diagnoses Final diagnoses:  None    Rx / DC Orders ED Discharge Orders     None

## 2023-01-17 NOTE — ED Provider Triage Note (Signed)
Emergency Medicine Provider Triage Evaluation Note  Veronica Jordan , a 77 y.o. female  was evaluated in triage.  History of IBS.  Pt complains of nausea for 6 weeks now with new onset vomiting that started this afternoon.  She notes she has been very anxious lately and feels this may be the cause of her vomiting.  No fevers or chills at home, no abdominal pain, she does have diarrhea and constipation consistent with her IBS.  No recent travel  Review of Systems  Positive: As above Negative: As above  Physical Exam  BP 125/72   Pulse 74   Temp 98.1 F (36.7 C)   Resp (!) 24   LMP 07/03/1998 (Approximate)   SpO2 96%  Gen:   Awake, no distress. movement consistent with Parkinson Resp:  Normal effort  MSK:   Moves extremities without difficulty  Abdomen nontender to palpation in all 4 quadrants  Medical Decision Making  Medically screening exam initiated at 7:03 PM.  Appropriate orders placed.  Veronica Jordan was informed that the remainder of the evaluation will be completed by another provider, this initial triage assessment does not replace that evaluation, and the importance of remaining in the ED until their evaluation is complete.     Arabella Merles, PA-C 01/17/23 1926

## 2023-01-18 ENCOUNTER — Emergency Department (HOSPITAL_COMMUNITY): Payer: Medicare Other

## 2023-01-18 DIAGNOSIS — K5730 Diverticulosis of large intestine without perforation or abscess without bleeding: Secondary | ICD-10-CM | POA: Diagnosis not present

## 2023-01-18 DIAGNOSIS — R1011 Right upper quadrant pain: Secondary | ICD-10-CM | POA: Diagnosis not present

## 2023-01-18 DIAGNOSIS — K449 Diaphragmatic hernia without obstruction or gangrene: Secondary | ICD-10-CM | POA: Diagnosis not present

## 2023-01-18 DIAGNOSIS — F410 Panic disorder [episodic paroxysmal anxiety] without agoraphobia: Secondary | ICD-10-CM | POA: Diagnosis not present

## 2023-01-18 DIAGNOSIS — Z9049 Acquired absence of other specified parts of digestive tract: Secondary | ICD-10-CM | POA: Diagnosis not present

## 2023-01-18 DIAGNOSIS — K838 Other specified diseases of biliary tract: Secondary | ICD-10-CM | POA: Diagnosis not present

## 2023-01-18 DIAGNOSIS — K429 Umbilical hernia without obstruction or gangrene: Secondary | ICD-10-CM | POA: Diagnosis not present

## 2023-01-18 LAB — URINALYSIS, ROUTINE W REFLEX MICROSCOPIC
Bilirubin Urine: NEGATIVE
Glucose, UA: NEGATIVE mg/dL
Hgb urine dipstick: NEGATIVE
Ketones, ur: 5 mg/dL — AB
Nitrite: NEGATIVE
Protein, ur: NEGATIVE mg/dL
Specific Gravity, Urine: 1.006 (ref 1.005–1.030)
pH: 5 (ref 5.0–8.0)

## 2023-01-18 MED ORDER — DIAZEPAM 5 MG/ML IJ SOLN
2.5000 mg | Freq: Once | INTRAMUSCULAR | Status: AC
  Administered 2023-01-18: 2.5 mg via INTRAVENOUS
  Filled 2023-01-18: qty 2

## 2023-01-18 MED ORDER — BENZTROPINE MESYLATE 1 MG/ML IJ SOLN
1.0000 mg | Freq: Once | INTRAMUSCULAR | Status: AC
  Administered 2023-01-18: 1 mg via INTRAVENOUS
  Filled 2023-01-18: qty 1
  Filled 2023-01-18: qty 2

## 2023-01-18 MED ORDER — ALPRAZOLAM 0.25 MG PO TABS: 0.2500 mg | ORAL_TABLET | Freq: Two times a day (BID) | ORAL | 0 refills | Status: AC | PRN

## 2023-01-18 NOTE — Discharge Instructions (Signed)
You need to follow-up with Gastroenterology to recheck your liver. I have placed a referral for this to happen.

## 2023-01-18 NOTE — ED Notes (Signed)
Pt having jaw stiffness and dry mouth with admin of reglan advised MD Pollina and at bedside, orders placed.

## 2023-01-18 NOTE — ED Notes (Addendum)
Patient transported to [facility] 

## 2023-01-22 ENCOUNTER — Telehealth: Payer: Self-pay | Admitting: Neurology

## 2023-01-22 NOTE — Telephone Encounter (Signed)
Patient is experiencing some really bad symptoms,  Patient states that she is having whole body shakes, emotional outburst, Patient states she is really worried and scared. Evlyn Clines

## 2023-01-23 ENCOUNTER — Encounter: Payer: Self-pay | Admitting: Physician Assistant

## 2023-01-23 NOTE — Telephone Encounter (Signed)
Patient has been to ED recently . She also has gone to her Rheumatologist , lupus ( kidney doc ) and PCP,. She is afraid it is her PD progressing. Patient feel nauseas and shaking all over she feels she can not breathe and is really concerned . Her PD is progressing. There ED notes recommended she go to a GI doc and she is scheduling that now. Patient wanting to see if dr. Arbutus Leas also wants to see her

## 2023-01-23 NOTE — Telephone Encounter (Signed)
Called patient and gave recommendations from Dr. Arbutus Leas patient understood and appreciated Dr. Arbutus Leas looking at her notes from other DR. And ED

## 2023-01-24 ENCOUNTER — Telehealth: Payer: Self-pay

## 2023-01-24 NOTE — Telephone Encounter (Signed)
Patients  daughter calling with concerns about her mom turning into a child like state in the evenings and unable to do most things she usually does Patient around 6:30 is stating she is depressed no one loves her and that she wanted to kill herself by taking her xanax. Patient tried leaving the house and taking her car keys but was stopped this  has been going on for two -three weeks . I gave patients daughter the number and address at behavioral health urgent care and stressed the importance of addressing anytime someone wants to die and has a plan. Daughter says she is fine during the day but having kicking temper tantrums at night

## 2023-01-24 NOTE — Telephone Encounter (Signed)
Spoke to patients daughter about getting her mom some help she is calling PCP tpmorrow

## 2023-01-26 ENCOUNTER — Ambulatory Visit
Admission: RE | Admit: 2023-01-26 | Discharge: 2023-01-26 | Disposition: A | Discharge status: Home / Self Care | Payer: Medicare Other | Source: Ambulatory Visit | Attending: Internal Medicine | Admitting: Internal Medicine

## 2023-01-26 ENCOUNTER — Ambulatory Visit: Admission: RE | Admit: 2023-01-26 | Payer: Medicare Other | Source: Ambulatory Visit

## 2023-01-26 DIAGNOSIS — R921 Mammographic calcification found on diagnostic imaging of breast: Secondary | ICD-10-CM

## 2023-01-26 DIAGNOSIS — N6322 Unspecified lump in the left breast, upper inner quadrant: Secondary | ICD-10-CM | POA: Diagnosis not present

## 2023-01-26 HISTORY — PX: BREAST BIOPSY: SHX20

## 2023-02-15 DIAGNOSIS — R110 Nausea: Secondary | ICD-10-CM | POA: Diagnosis not present

## 2023-02-15 DIAGNOSIS — R634 Abnormal weight loss: Secondary | ICD-10-CM | POA: Diagnosis not present

## 2023-02-15 DIAGNOSIS — I129 Hypertensive chronic kidney disease with stage 1 through stage 4 chronic kidney disease, or unspecified chronic kidney disease: Secondary | ICD-10-CM | POA: Diagnosis not present

## 2023-02-15 DIAGNOSIS — E039 Hypothyroidism, unspecified: Secondary | ICD-10-CM | POA: Diagnosis not present

## 2023-02-15 DIAGNOSIS — R197 Diarrhea, unspecified: Secondary | ICD-10-CM | POA: Diagnosis not present

## 2023-02-15 DIAGNOSIS — F339 Major depressive disorder, recurrent, unspecified: Secondary | ICD-10-CM | POA: Diagnosis not present

## 2023-02-15 DIAGNOSIS — F411 Generalized anxiety disorder: Secondary | ICD-10-CM | POA: Diagnosis not present

## 2023-02-15 DIAGNOSIS — K838 Other specified diseases of biliary tract: Secondary | ICD-10-CM | POA: Diagnosis not present

## 2023-02-15 DIAGNOSIS — N1831 Chronic kidney disease, stage 3a: Secondary | ICD-10-CM | POA: Diagnosis not present

## 2023-02-16 ENCOUNTER — Other Ambulatory Visit: Payer: Self-pay | Admitting: Internal Medicine

## 2023-02-16 DIAGNOSIS — K838 Other specified diseases of biliary tract: Secondary | ICD-10-CM

## 2023-02-20 DIAGNOSIS — R197 Diarrhea, unspecified: Secondary | ICD-10-CM | POA: Diagnosis not present

## 2023-03-11 DIAGNOSIS — G20A1 Parkinson's disease without dyskinesia, without mention of fluctuations: Secondary | ICD-10-CM | POA: Diagnosis not present

## 2023-03-11 DIAGNOSIS — R0602 Shortness of breath: Secondary | ICD-10-CM | POA: Diagnosis not present

## 2023-03-11 DIAGNOSIS — F419 Anxiety disorder, unspecified: Secondary | ICD-10-CM | POA: Diagnosis not present

## 2023-03-11 DIAGNOSIS — M329 Systemic lupus erythematosus, unspecified: Secondary | ICD-10-CM | POA: Diagnosis not present

## 2023-03-11 DIAGNOSIS — R0689 Other abnormalities of breathing: Secondary | ICD-10-CM | POA: Diagnosis not present

## 2023-03-11 DIAGNOSIS — R0789 Other chest pain: Secondary | ICD-10-CM | POA: Diagnosis not present

## 2023-03-11 DIAGNOSIS — Z79899 Other long term (current) drug therapy: Secondary | ICD-10-CM | POA: Diagnosis not present

## 2023-03-11 DIAGNOSIS — I499 Cardiac arrhythmia, unspecified: Secondary | ICD-10-CM | POA: Diagnosis not present

## 2023-03-11 DIAGNOSIS — R000 Tachycardia, unspecified: Secondary | ICD-10-CM | POA: Diagnosis not present

## 2023-03-11 DIAGNOSIS — F329 Major depressive disorder, single episode, unspecified: Secondary | ICD-10-CM | POA: Diagnosis not present

## 2023-03-11 DIAGNOSIS — I10 Essential (primary) hypertension: Secondary | ICD-10-CM | POA: Diagnosis not present

## 2023-03-11 DIAGNOSIS — I160 Hypertensive urgency: Secondary | ICD-10-CM | POA: Diagnosis not present

## 2023-03-11 DIAGNOSIS — E039 Hypothyroidism, unspecified: Secondary | ICD-10-CM | POA: Diagnosis not present

## 2023-03-11 DIAGNOSIS — Z79890 Hormone replacement therapy (postmenopausal): Secondary | ICD-10-CM | POA: Diagnosis not present

## 2023-03-11 DIAGNOSIS — Z87891 Personal history of nicotine dependence: Secondary | ICD-10-CM | POA: Diagnosis not present

## 2023-03-11 DIAGNOSIS — E876 Hypokalemia: Secondary | ICD-10-CM | POA: Diagnosis not present

## 2023-03-11 DIAGNOSIS — I213 ST elevation (STEMI) myocardial infarction of unspecified site: Secondary | ICD-10-CM | POA: Diagnosis not present

## 2023-03-11 DIAGNOSIS — I493 Ventricular premature depolarization: Secondary | ICD-10-CM | POA: Diagnosis not present

## 2023-03-11 DIAGNOSIS — Z7982 Long term (current) use of aspirin: Secondary | ICD-10-CM | POA: Diagnosis not present

## 2023-03-12 DIAGNOSIS — R7989 Other specified abnormal findings of blood chemistry: Secondary | ICD-10-CM | POA: Diagnosis not present

## 2023-03-12 DIAGNOSIS — I351 Nonrheumatic aortic (valve) insufficiency: Secondary | ICD-10-CM | POA: Diagnosis not present

## 2023-03-12 DIAGNOSIS — R008 Other abnormalities of heart beat: Secondary | ICD-10-CM | POA: Diagnosis not present

## 2023-03-12 DIAGNOSIS — R079 Chest pain, unspecified: Secondary | ICD-10-CM | POA: Diagnosis not present

## 2023-03-12 DIAGNOSIS — R918 Other nonspecific abnormal finding of lung field: Secondary | ICD-10-CM | POA: Diagnosis not present

## 2023-03-12 DIAGNOSIS — I700 Atherosclerosis of aorta: Secondary | ICD-10-CM | POA: Diagnosis not present

## 2023-03-12 DIAGNOSIS — R9431 Abnormal electrocardiogram [ECG] [EKG]: Secondary | ICD-10-CM | POA: Diagnosis not present

## 2023-03-13 DIAGNOSIS — R079 Chest pain, unspecified: Secondary | ICD-10-CM | POA: Diagnosis not present

## 2023-03-13 DIAGNOSIS — F411 Generalized anxiety disorder: Secondary | ICD-10-CM | POA: Diagnosis not present

## 2023-03-14 DIAGNOSIS — F411 Generalized anxiety disorder: Secondary | ICD-10-CM | POA: Diagnosis not present

## 2023-03-14 DIAGNOSIS — R079 Chest pain, unspecified: Secondary | ICD-10-CM | POA: Diagnosis not present

## 2023-03-19 DIAGNOSIS — I4581 Long QT syndrome: Secondary | ICD-10-CM | POA: Diagnosis not present

## 2023-03-19 DIAGNOSIS — F339 Major depressive disorder, recurrent, unspecified: Secondary | ICD-10-CM | POA: Diagnosis not present

## 2023-03-19 DIAGNOSIS — K838 Other specified diseases of biliary tract: Secondary | ICD-10-CM | POA: Diagnosis not present

## 2023-03-19 DIAGNOSIS — E876 Hypokalemia: Secondary | ICD-10-CM | POA: Diagnosis not present

## 2023-03-19 DIAGNOSIS — F411 Generalized anxiety disorder: Secondary | ICD-10-CM | POA: Diagnosis not present

## 2023-03-19 DIAGNOSIS — I10 Essential (primary) hypertension: Secondary | ICD-10-CM | POA: Diagnosis not present

## 2023-03-19 DIAGNOSIS — G20A1 Parkinson's disease without dyskinesia, without mention of fluctuations: Secondary | ICD-10-CM | POA: Diagnosis not present

## 2023-03-20 NOTE — Progress Notes (Deleted)
Assessment/Plan:   1.  Parkinsons Disease with family history of PD             -does not want genetic testing             -*** carbidopa/levodopa 25/100 CR, 2 tablet 3 times per day (she takes at 10am/2pm/6pm).  Discussed timing of meds.  Spreading meds too far out.    2.  Depression, Anxiety and Panic Attacks             -Following with primary care  -Has had prolonged QT due to possibly Lexapro  -Saw psychiatry in the hospital in September, 2024 and it was recommended that she follow closely with psychiatry as an outpatient.   3.  Dysphagia             -MBE on October 24, 2019 was normal, but speech therapist noted that solids remained in the mid to distal esophagus for prolonged period and GI referral was recommended.  Patient declined.  She feels she has been stable since that time.   4.  Sjogren's, lupus, fibromyalgia             -Follows with Dr. Corliss Skains   5.  RBD             -not as bad/often lately.  Lexapro may contribute but primarily due to Parkinsons Disease itself  6.  IBS, lifelong  -she asked if associated with Parkinsons Disease.  Discussed constipation is associated with Parkinsons Disease but not the IBS.  She has had lifelong issues and its getting worse.  She is f/u with PCP.   Subjective:   Veronica Jordan was seen today in follow up for Parkinsons disease.  My previous records were reviewed prior to todays visit as well as outside records available to me.  I increased her levodopa a little bit last visit and told her not to spread them so far apart.  She reports today that ***.  Patient was in the emergency room on July 17 due to anxiety attack.  It looks like she was seen at an outside ED a few weeks prior to that visit for the same complaint.  Emergency room records indicate that she had been diagnosed with a UTI but did not take the antibiotics because she did not like the way that they made her feel.  Emergency room evaluation confirmed a UTI.  However,  they noted that the patient was severely anxious and they gave her metoclopramide and it caused a dystonic reaction and they have to give her Cogentin.  She was referred to [facility] gastroenterology because of nausea and trouble eating.  She did call here right after her emergency room visit stating that she was feeling nauseated and shaking all over and was nervous.  She thought that her Parkinson's is getting worse.  We told her, based on emergency room records and rheumatology records, that this was anxiety and panic attacks and not Parkinson's disease.  Her daughter called [place] 2 days later with the same concerns, stating that the mother was depressed and that she was suicidal.  She was told to go to the behavioral health ED immediately.  They have did not to do that, stating that they would call the primary care physician the next day.  I did not get notes from primary care.  I do see she was back in the emergency room on September 8 with complaints of chest pain, anxiety and palpitations.  Patient  was quite hypertensive in the emergency room with blood pressure 214/59.  She was admitted.  Chest pain was felt due to his hypertensive urgency and that resolved upon control of blood pressure.  Psychiatry was consulted when she was in the hospital regarding anxiety.  Her QT was prolonged (547 on September 8 and 498 on September 9, and ultimately they recommended discontinuing her Lexapro.  Her BuSpar was increased.  Psychiatry recommended starting sertraline.  Current prescribed movement disorder medications: Continue carbidopa/levodopa 25/100 CR, 2 tablets at 10 AM/2 PM/6 PM (increased last visit)    ALLERGIES:   Allergies  Allergen Reactions   Clindamycin/Lincomycin     hives   Codeine Itching   Demerol [Meperidine] Nausea And Vomiting   Erythromycin    Iodinated Contrast Media    Meperidine Hcl    Ondansetron Hcl    Penicillins    Sulfonamide Derivatives    Zofran [Ondansetron Hcl]      CURRENT MEDICATIONS:  Outpatient Encounter Medications as of 03/22/2023  Medication Sig   albuterol (VENTOLIN HFA) 108 (90 Base) MCG/ACT inhaler SMARTSIG:2 Inhalation Via Inhaler Every 4 Hours (Patient not taking: Reported on 01/01/2023)   ALPRAZolam (XANAX) 0.25 MG tablet Take 1 tablet (0.25 mg total) by mouth 2 (two) times daily as needed for anxiety.   amLODipine (NORVASC) 10 MG tablet Take 10 mg by mouth daily.   aspirin 81 MG tablet Take 81 mg by mouth daily.   brimonidine (ALPHAGAN P) 0.1 % SOLN    Calcium Carbonate-Vitamin D 600-400 MG-UNIT tablet Take 1 tablet by mouth daily.   Carbidopa-Levodopa ER (SINEMET CR) 25-100 MG tablet controlled release Take 2 tablets 3 times per day (she takes at 10am/2pm/6pm)   Coenzyme Q10 (COQ-10) 50 MG CAPS Take 1 capsule by mouth daily.   dicyclomine (BENTYL) 20 MG tablet Take 20 mg by mouth every 6 (six) hours.   DIOVAN 320 MG tablet Take 320 mg by mouth daily. (Patient not taking: Reported on 01/01/2023)   escitalopram (LEXAPRO) 10 MG tablet Take 10 mg by mouth daily.    fluticasone (FLONASE) 50 MCG/ACT nasal spray as needed.   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   loratadine (CLARITIN) 10 MG tablet Take 10 mg by mouth daily.   losartan (COZAAR) 100 MG tablet Take 100 mg by mouth daily.   nitrofurantoin, macrocrystal-monohydrate, (MACROBID) 100 MG capsule Take 100 mg by mouth 2 (two) times daily.   Omega-3 Fatty Acids (OMEGA 3 PO) Take by mouth daily.   pantoprazole (PROTONIX) 40 MG tablet Take 40 mg by mouth daily.    potassium chloride SA (KLOR-CON M) 20 MEQ tablet Take 20 mEq by mouth daily.   simvastatin (ZOCOR) 20 MG tablet Take 20 mg by mouth at bedtime.   triamcinolone cream (KENALOG) 0.1 % Apply topically as needed.   valsartan (DIOVAN) 320 MG tablet Take 320 mg by mouth daily.   vitamin B-12  (CYANOCOBALAMIN) 1000 MCG tablet Take 1,000 mcg by mouth daily.   No facility-administered encounter medications on file as of 03/22/2023.    Objective:   PHYSICAL EXAMINATION:    VITALS:   There were no vitals filed for this visit.    GEN:  The patient appears stated age and is in NAD. HEENT:  Normocephalic, atraumatic.  The mucous membranes are moist. The superficial temporal arteries are without ropiness or tenderness. CV:  RRR  Lungs:  CTAB Neck/HEME:  There are no carotid bruits bilaterally.  Neurological examination:  Orientation: The patient is alert and oriented x3. Cranial nerves: There is good facial symmetry with min facial hypomimia. The speech is fluent and clear. Soft palate rises symmetrically and there is no tongue deviation. Hearing is intact to conversational tone. Sensation: Sensation is intact to light touch throughout Motor: Strength is at least antigravity x4.  Movement examination: Tone: There is mild increased tone in the LUE Abnormal movements: she has rare L foot/hand tremor Coordination:  There is min decremation with foot taps on the L.  All other rams are nl Gait and Station: The patient has no difficulty arising out of a deep-seated chair without the use of the hands. The patient's stride length is slightly decreased and she slightly drags the L leg, similar to last visit.  I have reviewed and interpreted the following labs independently    Chemistry      Component Value Date/Time   NA 138 01/17/2023 1938   NA 141 01/13/2021 1115   K 3.0 (L) 01/17/2023 1938   CL 101 01/17/2023 1938   CO2 19 (L) 01/17/2023 1938   BUN 12 01/17/2023 1938   BUN 16 01/13/2021 1115   CREATININE 1.21 (H) 01/17/2023 1938   CREATININE 1.03 (H) 01/01/2023 1337      Component Value Date/Time   CALCIUM 9.3 01/17/2023 1938   ALKPHOS 70 01/17/2023 1938   AST 19 01/17/2023 1938   ALT <5 01/17/2023 1938   BILITOT 0.4 01/17/2023 1938   BILITOT 0.4 01/13/2021 1115        Lab Results  Component Value Date   WBC 9.1 01/17/2023   HGB 11.0 (L) 01/17/2023   HCT 34.7 (L) 01/17/2023   MCV 92.5 01/17/2023   PLT 272 01/17/2023    Lab Results  Component Value Date   TSH 2.57 07/24/2018    Total time spent on today's visit was *** minutes, including both face-to-face time and nonface-to-face time.  Time included that spent on review of records (prior notes available to me/labs/imaging if pertinent), discussing treatment and goals, answering patient's questions and coordinating care.  Cc:  Cleatis Polka., MD

## 2023-03-21 ENCOUNTER — Telehealth: Payer: Self-pay | Admitting: Neurology

## 2023-03-21 NOTE — Telephone Encounter (Signed)
Talked to patients daughter and patient went to the ED potassium was very low and patient is doing better at this time

## 2023-03-21 NOTE — Telephone Encounter (Signed)
Veronica Jordan, make sure you documented right.  Said in your document that we don't have appt for tomorrow.  She was on schedule for tomorrow but had to cx appt.

## 2023-03-21 NOTE — Telephone Encounter (Signed)
I was incorrect patient cancelled appointment for tomorrow and needs to be added to CX list for a rescheduled appointment

## 2023-03-21 NOTE — Telephone Encounter (Signed)
Patient's daughter is requesting a call back, she was wanting to reschedule patients appointment for tmrw bc the patients mother passed away, but she wanted to talk to nurse first

## 2023-03-22 ENCOUNTER — Ambulatory Visit: Payer: Medicare Other | Admitting: Neurology

## 2023-03-27 NOTE — Progress Notes (Signed)
Virtual Visit Via Video       Consent was obtained for video visit:  Yes.   Answered questions that patient had about telehealth interaction:  Yes.   I discussed the limitations, risks, security and privacy concerns of performing an evaluation and management service by telemedicine. I also discussed with the patient that there may be a patient responsible charge related to this service. The patient expressed understanding and agreed to proceed.  Pt location: Home Physician Location: office Name of referring provider:  Cleatis Polka., MD I connected with Veronica Jordan at patients initiation/request on 04/04/2023 at 10:15 AM EDT by video enabled telemedicine application and verified that I am speaking with the correct person using two identifiers. Pt MRN:  [id number] Pt DOB:  1945/09/19 Video Participants:  Veronica Jordan;    Assessment/Plan:   1.  Parkinsons Disease with family history of PD             -does not want genetic testing             - carbidopa/levodopa 25/100 CR, 2 tablet 3 times per day.  She is currently taking last one at bed and discussed to move them to 4 hour intervals (10am/2pm/6pm)  -Declines physical therapy for now.  -Discussed increasing exercise.  -Sounds like she is having some mild dyskinesia.  Discussed nature and pathophysiology.  We ultimately decided that it would not be in her best interest to add more medication right now.   2.  Depression, Anxiety and Panic Attacks             -Following with primary care  -Has had prolonged QT due to possibly Lexapro and it is now d/c  -psychiatry in hospital started the patient on BuSpar, 10 mg twice per day and low-dose sertraline, 25 mg daily.  -Saw psychiatry in the hospital in September, 2024 and it was recommended that she follow closely with psychiatry as an outpatient.  She was given names, but has not called the names yet for a follow-up appointment.  I encouraged her to do that.   3.  Dysphagia              -MBE on October 24, 2019 was normal, but speech therapist noted that solids remained in the mid to distal esophagus for prolonged period and GI referral was recommended.  Patient declined.  She feels she has been stable since that time.   4.  Sjogren's, lupus, fibromyalgia             -Follows with Dr. Corliss Skains   5.  RBD             -Doing fairly well in that regard.  6.  IBS, lifelong  -she asked if associated with Parkinsons Disease.  Discussed constipation is associated with Parkinsons Disease but not the IBS.  She has had lifelong issues and its getting worse.  She is f/u with PCP.  7.  Weight loss  -Discussed increasing her protein intake.  Discussed potential interactions between levodopa and protein, but I just want her to take her medication 30 minutes before the protein intake.  I still want her on a high-protein diet.  Subjective:   Veronica Jordan was seen today in follow up for Parkinsons disease.  My previous records were reviewed prior to todays visit as well as outside records available to me.  I increased her levodopa a little bit last visit and told her not to spread  them so far apart.  She reports today that when she was in the hospital they told her to dose the medicine at 10 AM/2 PM/8 to 9 PM (bedtime).  Patient was in the emergency room on July 17 due to anxiety attack.  It looks like she was seen at an outside ED a few weeks prior to that visit for the same complaint.  Emergency room records indicate that she had been diagnosed with a UTI but did not take the antibiotics because she did not like the way that they made her feel.  Emergency room evaluation confirmed a UTI.  However, they noted that the patient was severely anxious and they gave her metoclopramide and it caused a dystonic reaction and they have to give her Cogentin.  She was referred to [facility] gastroenterology because of nausea and trouble eating.  She did call here right after her emergency room visit  stating that she was feeling nauseated and shaking all over and was nervous.  She thought that her Parkinson's is getting worse.  We told her, based on emergency room records and rheumatology records, that this was anxiety and panic attacks and not Parkinson's disease.  Her daughter called [place] 2 days later with the same concerns, stating that the mother was depressed and that she was suicidal.  She was told to go to the behavioral health ED immediately.  They have did not do that, stating that they would call the primary care physician the next day.  I did not get notes from primary care.  I do see she was back in the emergency room on September 8 with complaints of chest pain, anxiety and palpitations.  Patient was quite hypertensive in the emergency room with blood pressure 214/59.  She was admitted. She does state that amlodipine was added in the hospital, but was discontinued as an outpatient because her blood pressure was going too low.   Chest pain was felt due to his hypertensive urgency and that resolved upon control of blood pressure.  Psychiatry was consulted when she was in the hospital regarding anxiety.  Her QT was prolonged (547 on September 8 and 498 on September 9), and ultimately they recommended discontinuing her Lexapro.  Her BuSpar was increased.  Psychiatry recommended starting sertraline.  She states that the in house psychiatry gave her a list of names to f/u with and she hasn't do that yet.  Her mom died 2 weeks ago and she has been under a high degree of stress.  She is seeing a Warden/ranger.  She cannot recall who/where.  She does state, however, that she is overall feeling much better than she was prior to hospitalization.  She is trying to get her strength back.  She lost weight and states that she is down to about 95 pounds.  She is drinking 1 Premier protein shake per day.  She did have a few falls, but all of those were prior to hospitalization and she feels that her strength is getting  back.  She does not want physical therapy.  She does ask about the cause of the movements in her arms.  Current prescribed movement disorder medications: Continue carbidopa/levodopa 25/100 CR, 2 tablets at 10 AM/2 PM/6 PM (increased last visit)    ALLERGIES:   Allergies  Allergen Reactions   Clindamycin/Lincomycin     hives   Codeine Itching   Demerol [Meperidine] Nausea And Vomiting   Erythromycin    Iodinated Contrast Media    Meperidine Hcl  Ondansetron Hcl    Penicillins    Sulfonamide Derivatives    Zofran [Ondansetron Hcl]     CURRENT MEDICATIONS:  Outpatient Encounter Medications as of 04/04/2023  Medication Sig   busPIRone (BUSPAR) 10 MG tablet Take 10 mg by mouth 2 (two) times daily.   sertraline (ZOLOFT) 25 MG tablet Take 25 mg by mouth daily.   albuterol (VENTOLIN HFA) 108 (90 Base) MCG/ACT inhaler SMARTSIG:2 Inhalation Via Inhaler Every 4 Hours (Patient not taking: Reported on 01/01/2023)   ALPRAZolam (XANAX) 0.25 MG tablet Take 1 tablet (0.25 mg total) by mouth 2 (two) times daily as needed for anxiety.   aspirin 81 MG tablet Take 81 mg by mouth daily.   brimonidine (ALPHAGAN P) 0.1 % SOLN    Calcium Carbonate-Vitamin D 600-400 MG-UNIT tablet Take 1 tablet by mouth daily.   Carbidopa-Levodopa ER (SINEMET CR) 25-100 MG tablet controlled release Take 2 tablets 3 times per day (she takes at 10am/2pm/6pm)   Coenzyme Q10 (COQ-10) 50 MG CAPS Take 1 capsule by mouth daily.   dicyclomine (BENTYL) 20 MG tablet Take 20 mg by mouth every 6 (six) hours.   fluticasone (FLONASE) 50 MCG/ACT nasal spray as needed.   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   loratadine (CLARITIN) 10 MG tablet Take 10 mg by mouth daily.   losartan (COZAAR) 100 MG tablet Take 100 mg by mouth daily.   Omega-3 Fatty Acids (OMEGA 3 PO) Take by mouth daily.    pantoprazole (PROTONIX) 40 MG tablet Take 40 mg by mouth daily.    potassium chloride SA (KLOR-CON M) 20 MEQ tablet Take 20 mEq by mouth daily.   simvastatin (ZOCOR) 20 MG tablet Take 20 mg by mouth at bedtime.   triamcinolone cream (KENALOG) 0.1 % Apply topically as needed.   valsartan (DIOVAN) 320 MG tablet Take 320 mg by mouth daily.   vitamin B-12 (CYANOCOBALAMIN) 1000 MCG tablet Take 1,000 mcg by mouth daily.   [DISCONTINUED] amLODipine (NORVASC) 10 MG tablet Take 10 mg by mouth daily.   [DISCONTINUED] DIOVAN 320 MG tablet Take 320 mg by mouth daily. (Patient not taking: Reported on 01/01/2023)   [DISCONTINUED] escitalopram (LEXAPRO) 10 MG tablet Take 10 mg by mouth daily.    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   No facility-administered encounter medications on file as of 04/04/2023.    Objective:   PHYSICAL EXAMINATION:    VITALS:   There were no vitals filed for this visit.    GEN:  The patient appears stated age and is in NAD.  Neurological examination:  Orientation: The patient is alert and oriented x3. Cranial nerves: There is good facial symmetry with min facial hypomimia. The speech is fluent and clear.    Movement examination: Abnormal movements: No tremor today.  No significant dyskinesia noted. Coordination: No decremation with finger taps, alternation of supination/pronation of the forearm, hand opening and closing or the action of "turning a light bulb" bilaterally.  Unable to see her legs through the camera Gait and Station: The patient declined, saying that the camera would not be able to visualize where she was walking.  I have reviewed and interpreted the following labs independently    Chemistry  Component Value Date/Time   NA 138 01/17/2023 1938    NA 141 01/13/2021 1115   K 3.0 (L) 01/17/2023 1938   CL 101 01/17/2023 1938   CO2 19 (L) 01/17/2023 1938   BUN 12 01/17/2023 1938   BUN 16 01/13/2021 1115   CREATININE 1.21 (H) 01/17/2023 1938   CREATININE 1.03 (H) 01/01/2023 1337      Component Value Date/Time   CALCIUM 9.3 01/17/2023 1938   ALKPHOS 70 01/17/2023 1938   AST 19 01/17/2023 1938   ALT <5 01/17/2023 1938   BILITOT 0.4 01/17/2023 1938   BILITOT 0.4 01/13/2021 1115       Lab Results  Component Value Date   WBC 9.1 01/17/2023   HGB 11.0 (L) 01/17/2023   HCT 34.7 (L) 01/17/2023   MCV 92.5 01/17/2023   PLT 272 01/17/2023    Lab Results  Component Value Date   TSH 2.57 07/24/2018   Follow up Instructions      -I discussed the assessment and treatment plan with the patient. The patient was provided an opportunity to ask questions and all were answered. The patient agreed with the plan and demonstrated an understanding of the instructions.   The patient was advised to call back or seek an in-person evaluation if the symptoms worsen or if the condition fails to improve as anticipated.    Total time spent on today's visit was , including both face-to-face time and nonface-to-face time.  Time included that spent on review of records (prior notes available to me/labs/imaging if pertinent), discussing treatment and goals, answering patient's questions and coordinating care.   Kerin Salen, DO   Cc:  Cleatis Polka., MD

## 2023-04-01 ENCOUNTER — Ambulatory Visit
Admission: RE | Admit: 2023-04-01 | Discharge: 2023-04-01 | Disposition: A | Discharge status: Home / Self Care | Payer: Medicare Other | Source: Ambulatory Visit | Attending: Internal Medicine | Admitting: Internal Medicine

## 2023-04-01 DIAGNOSIS — R634 Abnormal weight loss: Secondary | ICD-10-CM | POA: Diagnosis not present

## 2023-04-01 DIAGNOSIS — K838 Other specified diseases of biliary tract: Secondary | ICD-10-CM

## 2023-04-01 DIAGNOSIS — R112 Nausea with vomiting, unspecified: Secondary | ICD-10-CM | POA: Diagnosis not present

## 2023-04-01 MED ORDER — GADOPICLENOL 0.5 MMOL/ML IV SOLN
5.0000 mL | Freq: Once | INTRAVENOUS | Status: AC | PRN
  Administered 2023-04-01: 5 mL via INTRAVENOUS

## 2023-04-03 ENCOUNTER — Other Ambulatory Visit: Payer: Self-pay | Admitting: Physician Assistant

## 2023-04-03 DIAGNOSIS — M3219 Other organ or system involvement in systemic lupus erythematosus: Secondary | ICD-10-CM

## 2023-04-03 NOTE — Telephone Encounter (Signed)
Last Fill: 01/01/2023  Eye exam: 11/23/2021 WNL Patient states she has her appointment scheduled for January 2025. Patient is on the cancellation list.   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   Okay to refill Plaquenil?

## 2023-04-04 ENCOUNTER — Telehealth: Payer: Medicare Other | Admitting: Neurology

## 2023-04-04 DIAGNOSIS — G20B2 Parkinson's disease with dyskinesia, with fluctuations: Secondary | ICD-10-CM | POA: Diagnosis not present

## 2023-04-04 DIAGNOSIS — F410 Panic disorder [episodic paroxysmal anxiety] without agoraphobia: Secondary | ICD-10-CM | POA: Diagnosis not present

## 2023-04-04 DIAGNOSIS — R634 Abnormal weight loss: Secondary | ICD-10-CM

## 2023-04-04 DIAGNOSIS — F411 Generalized anxiety disorder: Secondary | ICD-10-CM | POA: Diagnosis not present

## 2023-04-10 ENCOUNTER — Ambulatory Visit: Payer: Medicare Other | Admitting: Physician Assistant

## 2023-04-10 DIAGNOSIS — M810 Age-related osteoporosis without current pathological fracture: Secondary | ICD-10-CM | POA: Diagnosis not present

## 2023-04-10 DIAGNOSIS — E039 Hypothyroidism, unspecified: Secondary | ICD-10-CM | POA: Diagnosis not present

## 2023-04-10 DIAGNOSIS — D649 Anemia, unspecified: Secondary | ICD-10-CM | POA: Diagnosis not present

## 2023-04-10 DIAGNOSIS — E785 Hyperlipidemia, unspecified: Secondary | ICD-10-CM | POA: Diagnosis not present

## 2023-04-10 DIAGNOSIS — E1129 Type 2 diabetes mellitus with other diabetic kidney complication: Secondary | ICD-10-CM | POA: Diagnosis not present

## 2023-04-12 ENCOUNTER — Encounter: Payer: Self-pay | Admitting: Physician Assistant

## 2023-04-12 ENCOUNTER — Ambulatory Visit: Payer: Medicare Other | Admitting: Physician Assistant

## 2023-04-12 VITALS — BP 122/66 | HR 62 | Ht 59.0 in | Wt 102.0 lb

## 2023-04-12 DIAGNOSIS — R634 Abnormal weight loss: Secondary | ICD-10-CM | POA: Diagnosis not present

## 2023-04-12 DIAGNOSIS — R112 Nausea with vomiting, unspecified: Secondary | ICD-10-CM

## 2023-04-12 DIAGNOSIS — K838 Other specified diseases of biliary tract: Secondary | ICD-10-CM | POA: Diagnosis not present

## 2023-04-12 DIAGNOSIS — K580 Irritable bowel syndrome with diarrhea: Secondary | ICD-10-CM

## 2023-04-12 NOTE — Progress Notes (Signed)
Chief Complaint: Follow-up after ER visit for nausea, vomiting and IBS symptoms  HPI:    Veronica Jordan is a 77 year old female with a past medical history as listed below including Parkinson's disease, Sjogren's, lupus, anxiety, depression, diabetes, fibromyalgia, GERD, IBS and multiple others, known to Dr. Marina Goodell, who was referred to me by Cleatis Polka., MD for a complaint of nausea, vomiting and IBS symptoms.      03/17/2011 colonoscopy with moderate diverticulosis in the sigmoid colon otherwise normal.  Repeat recommended 10 years.    01/18/2023 CT abdomen pelvis without contrast showed dilation of the common bile duct up to 1.2 cm and recommended ultrasound also diverticulosis and a small hiatal hernia.    01/18/2023 right upper quadrant ultrasound with status post cholecystectomy and intra and extrahepatic biliary dilation, CBD measuring 13 mm.    03/11/2023 ER visit for chest pain.  At that time found to have blood pressures 214/59.  At that time thought to have chest pain secondary to hypertensive urgency.    03/11/2023 TSH and troponin normal, CMP with a potassium low at 2.7 and decreased AST and ALT.    03/13/2023 BMP normal, magnesium normal.    04/01/2023 MRCP showed status post cholecystectomy with biliary ductal dilation and common hepatic duct measuring up to 1.1 cm in caliber, CBD tapered smoothly without calculus or other obstruction, consistent with benign postoperative biliary ductal dilation and unchanged when compared to previous imaging back to 2019, no other acute findings.    04/04/2023 video visit with her neurologist, at that time discussed dysphagia within normal MVE on 10/24/2019, but speech therapist did note that solids remained in the mid to distal esophagus for a prolonged period and recommended GI referral.  Described her symptoms stable since that time.  Discussed lifelong problems with IBS.    Today, patient tells me that she actually feels fine for once.  She has had a rough  summer with increased anxiety and stress after losing her mother.  Describes that she had nausea and vomiting constantly and could not eat,  with a decreased appetite and a weight loss of around 25 pounds for a few months, initially seen in [place] at the hospital for her nausea and vomiting and they thought maybe related to anxiety but did not really know.  Then seen at the [facility] ER as above with CT scan showing dilation of bile duct but they still thought her nausea and vomiting was related to anxiety.  Lastly seen one further time for chest pain.  Currently over the past month she actually feels good.  No further nausea and vomiting and she is on a protein shake supplement twice a day and has gained about 7 pounds.  Tells me that prior to this she was having a lot of diarrhea and rectal itching, but all of this is better now too.  Currently taking Align probiotic and feels like it is helping.  She is happy with where she is at.    Denies fever, chills or blood in her stool.  Past Medical History:  Diagnosis Date   Allergic rhinitis    Anemia    Anxiety and depression    Depression    Diabetes mellitus    Diverticulitis    Fibromyalgia    GERD (gastroesophageal reflux disease)    History of gallstones    History of kidney stones    Hyperlipemia    Hypertension    Hypothyroidism    IBS (irritable bowel syndrome)  Osteoarthritis    Osteoporosis    Renal calculi    Sjogren's syndrome (HCC)    Systemic lupus (HCC) 2001    Past Surgical History:  Procedure Laterality Date   APPENDECTOMY  1970   BREAST BIOPSY Left 08/2020   BREAST BIOPSY Left 01/26/2023   MM LT BREAST BX W LOC DEV 1ST LESION IMAGE BX SPEC STEREO GUIDE 01/26/2023 [facility]   BREAST EXCISIONAL BIOPSY Left    CATARACT EXTRACTION     bilateral   CESAREAN SECTION  1976, 1977   x2   CHOLECYSTECTOMY  1988   TONSILLECTOMY AND ADENOIDECTOMY     TOTAL ABDOMINAL HYSTERECTOMY  2000   BSO due to fibroids     Current Outpatient Medications  Medication Sig Dispense Refill   albuterol (VENTOLIN HFA) 108 (90 Base) MCG/ACT inhaler SMARTSIG:2 Inhalation Via Inhaler Every 4 Hours (Patient not taking: Reported on 01/01/2023)     ALPRAZolam (XANAX) 0.25 MG tablet Take 1 tablet (0.25 mg total) by mouth 2 (two) times daily as needed for anxiety. 15 tablet 0   aspirin 81 MG tablet Take 81 mg by mouth daily.     brimonidine (ALPHAGAN P) 0.1 % SOLN      busPIRone (BUSPAR) 10 MG tablet Take 10 mg by mouth 2 (two) times daily.     Calcium Carbonate-Vitamin D 600-400 MG-UNIT tablet Take 1 tablet by mouth daily.     Carbidopa-Levodopa ER (SINEMET CR) 25-100 MG tablet controlled release Take 2 tablets 3 times per day (she takes at 10am/2pm/6pm) 540 tablet 0   Coenzyme Q10 (COQ-10) 50 MG CAPS Take 1 capsule by mouth daily.     dicyclomine (BENTYL) 20 MG tablet Take 20 mg by mouth every 6 (six) hours.     fluticasone (FLONASE) 50 MCG/ACT nasal spray as needed.     hydroxychloroquine (PLAQUENIL) 200 MG tablet TAKE 1 TABLET DAILY FOR SYSTEMIC LUPUS ERYTHEMATOSUS 90 tablet 0   levothyroxine (SYNTHROID, LEVOTHROID) 25 MCG tablet Take 25 mcg by mouth daily.     loratadine (CLARITIN) 10 MG tablet Take 10 mg by mouth daily.     losartan (COZAAR) 100 MG tablet Take 100 mg by mouth daily.     Omega-3 Fatty Acids (OMEGA 3 PO) Take by mouth daily.     pantoprazole (PROTONIX) 40 MG tablet Take 40 mg by mouth daily.      potassium chloride SA (KLOR-CON M) 20 MEQ tablet Take 20 mEq by mouth daily.     sertraline (ZOLOFT) 25 MG tablet Take 25 mg by mouth daily.     simvastatin (ZOCOR) 20 MG tablet Take 20 mg by mouth at bedtime.     triamcinolone cream (KENALOG) 0.1 % Apply topically as needed.     valsartan (DIOVAN) 320 MG tablet Take 320 mg by mouth daily.     vitamin B-12 (CYANOCOBALAMIN) 1000 MCG tablet Take 1,000 mcg by mouth daily.     No current facility-administered medications for this visit.    Allergies as of  04/12/2023 - Review Complete 01/17/2023  Allergen Reaction Noted   Clindamycin/lincomycin  08/21/2012   Codeine Itching 08/09/2011   Demerol [meperidine] Nausea And Vomiting 03/30/2020   Erythromycin  12/13/2007   Iodinated contrast media  08/25/2021   Meperidine hcl  12/13/2007   Ondansetron hcl  12/13/2007   Penicillins  12/13/2007   Sulfonamide derivatives  12/13/2007   Zofran [ondansetron hcl]  11/06/2012    Family History  Problem Relation Age of Onset   Asthma Mother  COPD Mother    Heart disease Father    Asthma Sister    Parkinsonism Other        MGM   Hypertension Brother    Heart attack Brother    Arthritis Maternal Grandmother    Heart disease Maternal Grandfather    Lupus Paternal Grandmother    Heart attack Paternal Grandfather    Hypertension Sister    Chronic fatigue Daughter    Colon cancer Neg Hx    Esophageal cancer Neg Hx    Stomach cancer Neg Hx     Social History   Socioeconomic History   Marital status: Married    Spouse name: Not on file   Number of children: 2   Years of education: Not on file   Highest education level: Associate degree: academic program  Occupational History   Occupation: Event organiser: UNEMPLOYED    Comment: retired  Tobacco Use   Smoking status: Never    Passive exposure: Never   Smokeless tobacco: Never  Vaping Use   Vaping status: Never Used  Substance and Sexual Activity   Alcohol use: Yes    Comment: rarely wine   Drug use: No   Sexual activity: Not Currently    Birth control/protection: Post-menopausal, Surgical    Comment: hysterectomy  Other Topics Concern   Not on file  Social History Narrative   Youngest daughter died in MVA.   Right handed    Lives in a two story home that has a basement   Social Determinants of Health   Financial Resource Strain: Not on file  Food Insecurity: Low Risk  (03/12/2023)   Received from [facility]   Hunger Vital Sign    Worried About  Running Out of Food in the Last Year: Never true    Ran Out of Food in the Last Year: Never true  Transportation Needs: No Transportation Needs (03/12/2023)   Received from [facility]    In the past 12 months, has lack of reliable transportation kept you from medical appointments, meetings, work or from getting things needed for daily living? : No  Physical Activity: Not on file  Stress: Not on file  Social Connections: Not on file  Intimate Partner Violence: Not on file    Review of Systems:    Constitutional: No weight loss, fever or chills Skin: No rash Cardiovascular: No chest pain  Respiratory: No SOB  Gastrointestinal: See HPI and otherwise negative Genitourinary: No dysuria Neurological: No headache, dizziness or syncope Musculoskeletal: No new muscle or joint pain Hematologic: No bleeding  Psychiatric: +anxiety   Physical Exam:  Vital signs: BP 122/66   Pulse 62   Ht 4\' 11"  (1.499 m)   Wt 102 lb (46.3 kg)   LMP 07/03/1998 (Approximate)   BMI 20.60 kg/m   Constitutional:   Pleasant Elderly Caucasian female appears to be in NAD, Well developed, Well nourished, alert and cooperative Head:  Normocephalic and atraumatic. Eyes:   PEERL, EOMI. No icterus. Conjunctiva pink. Ears:  Normal auditory acuity. Neck:  Supple Throat: Oral cavity and pharynx without inflammation, swelling or lesion.  Respiratory: Respirations even and unlabored. Lungs clear to auscultation bilaterally.   No wheezes, crackles, or rhonchi.  Cardiovascular: Normal S1, S2. No MRG. Regular rate and rhythm. No peripheral edema, cyanosis or pallor.  Gastrointestinal:  Soft, nondistended, nontender. No rebound or guarding. Normal bowel sounds. No appreciable masses or hepatomegaly. Rectal:  Not performed.  Msk:  Symmetrical without gross deformities. Without edema, no deformity or joint abnormality.  Neurologic:  Alert and  oriented x4;  grossly normal neurologically.  Skin:   Dry and  intact without significant lesions or rashes. Psychiatric: Demonstrates good judgement and reason without abnormal affect or behaviors.  RELEVANT LABS AND IMAGING: CBC    Component Value Date/Time   WBC 9.1 01/17/2023 1938   RBC 3.75 (L) 01/17/2023 1938   HGB 11.0 (L) 01/17/2023 1938   HGB 12.0 01/13/2021 1115   HCT 34.7 (L) 01/17/2023 1938   HCT 37.7 01/13/2021 1115   PLT 272 01/17/2023 1938   PLT 244 01/13/2021 1115   MCV 92.5 01/17/2023 1938   MCV 91 01/13/2021 1115   MCH 29.3 01/17/2023 1938   MCHC 31.7 01/17/2023 1938   RDW 13.1 01/17/2023 1938   RDW 12.6 01/13/2021 1115   LYMPHSABS 1,245 01/01/2023 1337   LYMPHSABS 1.9 01/13/2021 1115   EOSABS 188 01/01/2023 1337   EOSABS 0.3 01/13/2021 1115   BASOSABS 68 01/01/2023 1337   BASOSABS 0.1 01/13/2021 1115    CMP     Component Value Date/Time   NA 138 01/17/2023 1938   NA 141 01/13/2021 1115   K 3.0 (L) 01/17/2023 1938   CL 101 01/17/2023 1938   CO2 19 (L) 01/17/2023 1938   GLUCOSE 132 (H) 01/17/2023 1938   BUN 12 01/17/2023 1938   BUN 16 01/13/2021 1115   CREATININE 1.21 (H) 01/17/2023 1938   CREATININE 1.03 (H) 01/01/2023 1337   CALCIUM 9.3 01/17/2023 1938   PROT 7.2 01/17/2023 1938   PROT 6.7 01/13/2021 1115   ALBUMIN 4.0 01/17/2023 1938   ALBUMIN 4.1 01/13/2021 1115   AST 19 01/17/2023 1938   ALT <5 01/17/2023 1938   ALKPHOS 70 01/17/2023 1938   BILITOT 0.4 01/17/2023 1938   BILITOT 0.4 01/13/2021 1115   GFRNONAA 47 (L) 01/17/2023 1938   GFRNONAA 50 (L) 08/06/2020 1417   GFRAA 58 (L) 08/06/2020 1417    Assessment: 1.  IBS-D: Currently stable on Align daily 2.  Nausea and vomiting: Initially with increased anxiety which is likely the cause 3.  Weight loss with decreased appetite: Patient has now started gaining weight about 7 pounds over the past month since taking a protein shake 4.  Dilated CBD: Similar to imaging back to 2019, LFTs normal, no further symptoms, no need to follow  Plan: 1.  All of  patient's symptoms have currently resolved, likely mostly related to increased stress and anxiety over the loss of her mother over the summer, recent imaging showing dilated bile duct which was similar over the past 5 years, LFTs normal.  We discussed that there is no concern with this. 2.  Patient to continue doing what she is doing. 3.  Patient to follow in clinic with [place] as needed.  Hyacinth Meeker, PA-C [facility] 04/12/2023, 2:15 PM  Cc: Cleatis Polka., MD

## 2023-04-12 NOTE — Patient Instructions (Signed)
Follow up as needed.  _______________________________________________________  If your blood pressure at your visit was 140/90 or greater, please contact your primary care physician to follow up on this.  _______________________________________________________  If you are age 75 or older, your body mass index should be between 23-30. Your Body mass index is 20.6 kg/m. If this is out of the aforementioned range listed, please consider follow up with your Primary Care Provider.  If you are age 34 or younger, your body mass index should be between 19-25. Your Body mass index is 20.6 kg/m. If this is out of the aformentioned range listed, please consider follow up with your Primary Care Provider.   ________________________________________________________  The [facility] providers would like to encourage you to use [facility] to communicate with providers for non-urgent requests or questions.  Due to long hold times on the telephone, sending your provider a message by [facility] may be a faster and more efficient way to get a response.  Please allow 48 business hours for a response.  Please remember that this is for non-urgent requests.   It was a pleasure to see you today!  Thank you for trusting me with your gastrointestinal care!    Hyacinth Meeker, PA-C

## 2023-04-12 NOTE — Progress Notes (Signed)
Noted  

## 2023-04-17 DIAGNOSIS — N1831 Chronic kidney disease, stage 3a: Secondary | ICD-10-CM | POA: Diagnosis not present

## 2023-04-17 DIAGNOSIS — E872 Acidosis, unspecified: Secondary | ICD-10-CM | POA: Diagnosis not present

## 2023-04-17 DIAGNOSIS — M810 Age-related osteoporosis without current pathological fracture: Secondary | ICD-10-CM | POA: Diagnosis not present

## 2023-04-17 DIAGNOSIS — F339 Major depressive disorder, recurrent, unspecified: Secondary | ICD-10-CM | POA: Diagnosis not present

## 2023-04-17 DIAGNOSIS — E1129 Type 2 diabetes mellitus with other diabetic kidney complication: Secondary | ICD-10-CM | POA: Diagnosis not present

## 2023-04-17 DIAGNOSIS — E041 Nontoxic single thyroid nodule: Secondary | ICD-10-CM | POA: Diagnosis not present

## 2023-04-17 DIAGNOSIS — Z1339 Encounter for screening examination for other mental health and behavioral disorders: Secondary | ICD-10-CM | POA: Diagnosis not present

## 2023-04-17 DIAGNOSIS — E785 Hyperlipidemia, unspecified: Secondary | ICD-10-CM | POA: Diagnosis not present

## 2023-04-17 DIAGNOSIS — I129 Hypertensive chronic kidney disease with stage 1 through stage 4 chronic kidney disease, or unspecified chronic kidney disease: Secondary | ICD-10-CM | POA: Diagnosis not present

## 2023-04-17 DIAGNOSIS — F411 Generalized anxiety disorder: Secondary | ICD-10-CM | POA: Diagnosis not present

## 2023-04-17 DIAGNOSIS — R82998 Other abnormal findings in urine: Secondary | ICD-10-CM | POA: Diagnosis not present

## 2023-04-17 DIAGNOSIS — G20A1 Parkinson's disease without dyskinesia, without mention of fluctuations: Secondary | ICD-10-CM | POA: Diagnosis not present

## 2023-04-17 DIAGNOSIS — M3210 Systemic lupus erythematosus, organ or system involvement unspecified: Secondary | ICD-10-CM | POA: Diagnosis not present

## 2023-04-17 DIAGNOSIS — Z23 Encounter for immunization: Secondary | ICD-10-CM | POA: Diagnosis not present

## 2023-04-17 DIAGNOSIS — Z0000 Encounter for general adult medical examination without abnormal findings: Secondary | ICD-10-CM | POA: Diagnosis not present

## 2023-04-17 DIAGNOSIS — I10 Essential (primary) hypertension: Secondary | ICD-10-CM | POA: Diagnosis not present

## 2023-04-17 DIAGNOSIS — Z1331 Encounter for screening for depression: Secondary | ICD-10-CM | POA: Diagnosis not present

## 2023-04-18 ENCOUNTER — Other Ambulatory Visit: Payer: Self-pay

## 2023-04-18 DIAGNOSIS — G20A1 Parkinson's disease without dyskinesia, without mention of fluctuations: Secondary | ICD-10-CM

## 2023-04-18 MED ORDER — CARBIDOPA-LEVODOPA ER 25-100 MG PO TBCR
EXTENDED_RELEASE_TABLET | ORAL | 0 refills | Status: DC
Start: 2023-04-18 — End: 2023-07-11

## 2023-05-22 DIAGNOSIS — R809 Proteinuria, unspecified: Secondary | ICD-10-CM | POA: Diagnosis not present

## 2023-05-22 DIAGNOSIS — I129 Hypertensive chronic kidney disease with stage 1 through stage 4 chronic kidney disease, or unspecified chronic kidney disease: Secondary | ICD-10-CM | POA: Diagnosis not present

## 2023-05-22 DIAGNOSIS — G20B2 Parkinson's disease with dyskinesia, with fluctuations: Secondary | ICD-10-CM | POA: Diagnosis not present

## 2023-05-22 DIAGNOSIS — M329 Systemic lupus erythematosus, unspecified: Secondary | ICD-10-CM | POA: Diagnosis not present

## 2023-05-22 DIAGNOSIS — E1122 Type 2 diabetes mellitus with diabetic chronic kidney disease: Secondary | ICD-10-CM | POA: Diagnosis not present

## 2023-05-22 DIAGNOSIS — E876 Hypokalemia: Secondary | ICD-10-CM | POA: Diagnosis not present

## 2023-05-22 DIAGNOSIS — N181 Chronic kidney disease, stage 1: Secondary | ICD-10-CM | POA: Diagnosis not present

## 2023-05-22 DIAGNOSIS — D631 Anemia in chronic kidney disease: Secondary | ICD-10-CM | POA: Diagnosis not present

## 2023-05-29 NOTE — Progress Notes (Unsigned)
Office Visit Note  Patient: Veronica Jordan             Date of Birth: Jul 26, 1945           MRN: [id number]             PCP: Cleatis Polka., MD Referring: Cleatis Polka., MD Visit Date: 06/06/2023 Occupation: @GUAROCC @  Subjective:  No chief complaint on file.   History of Present Illness: Veronica Jordan is a 77 y.o. female ***     Activities of Daily Living:  Patient reports morning stiffness for *** {minute/hour:19697}.   Patient {ACTIONS;DENIES/REPORTS:21021675::"Denies"} nocturnal pain.  Difficulty dressing/grooming: {ACTIONS;DENIES/REPORTS:21021675::"Denies"} Difficulty climbing stairs: {ACTIONS;DENIES/REPORTS:21021675::"Denies"} Difficulty getting out of chair: {ACTIONS;DENIES/REPORTS:21021675::"Denies"} Difficulty using hands for taps, buttons, cutlery, and/or writing: {ACTIONS;DENIES/REPORTS:21021675::"Denies"}  No Rheumatology ROS completed.   PMFS History:  Patient Active Problem List   Diagnosis Date Noted   Sjogren's syndrome with keratoconjunctivitis sicca (HCC) 08/07/2018   History of nephritis 08/07/2018   History of optic neuritis 08/07/2018   Fibromyalgia 08/07/2018   Primary osteoarthritis of both hands 08/07/2018   Primary osteoarthritis of both knees 08/07/2018   DDD (degenerative disc disease), lumbar 08/07/2018   Eosinophilic esophagitis 08/07/2018   History of IBS 08/07/2018   History of gastroesophageal reflux (GERD) 08/07/2018   History of diverticulosis 08/07/2018   History of type 2 diabetes mellitus 08/07/2018   History of kidney stones 08/07/2018   History of peripheral neuropathy 08/07/2018   Dyslipidemia 08/07/2018   Anxiety and depression 08/07/2018   Essential tremor 08/07/2018   Postmenopausal hormone replacement therapy 03/05/2015   Osteoporosis 03/05/2015   Atrophic vaginitis 03/05/2015   HYPERTHYROIDISM 12/13/2007   Hypercholesterolemia 12/13/2007   Essential hypertension 12/13/2007   ALLERGIC RHINITIS 12/13/2007    LUPUS ERYTHEMATOSUS 12/13/2007   DYSPNEA 12/13/2007    Past Medical History:  Diagnosis Date   Allergic rhinitis    Anemia    Anxiety and depression    Depression    Diabetes mellitus    Diverticulitis    Fibromyalgia    GERD (gastroesophageal reflux disease)    History of gallstones    History of kidney stones    Hyperlipemia    Hypertension    Hypothyroidism    IBS (irritable bowel syndrome)    Osteoarthritis    Osteoporosis    Renal calculi    Sjogren's syndrome (HCC)    Systemic lupus (HCC) 2001    Family History  Problem Relation Age of Onset   Asthma Mother    COPD Mother    Heart disease Father    Asthma Sister    Parkinsonism Other        MGM   Hypertension Brother    Heart attack Brother    Arthritis Maternal Grandmother    Heart disease Maternal Grandfather    Lupus Paternal Grandmother    Heart attack Paternal Grandfather    Hypertension Sister    Chronic fatigue Daughter    Colon cancer Neg Hx    Esophageal cancer Neg Hx    Stomach cancer Neg Hx    Past Surgical History:  Procedure Laterality Date   APPENDECTOMY  1970   BREAST BIOPSY Left 08/2020   BREAST BIOPSY Left 01/26/2023   MM LT BREAST BX W LOC DEV 1ST LESION IMAGE BX SPEC STEREO GUIDE 01/26/2023 [facility]   BREAST EXCISIONAL BIOPSY Left    CATARACT EXTRACTION     bilateral   CESAREAN SECTION  1976, 1977   x2  CHOLECYSTECTOMY  1988   TONSILLECTOMY AND ADENOIDECTOMY     TOTAL ABDOMINAL HYSTERECTOMY  2000   BSO due to fibroids   Social History   Social History Narrative   Youngest daughter died in MVA.   Right handed    Lives in a two story home that has a basement   Immunization History  Administered Date(s) Administered   PFIZER(Purple Top)SARS-COV-2 Vaccination 08/29/2019, 09/26/2019     Objective: Vital Signs: LMP 07/03/1998 (Approximate)    Physical Exam   Musculoskeletal Exam: ***  CDAI Exam: CDAI Score: -- Patient Global: --; Provider Global:  -- Swollen: --; Tender: -- Joint Exam 06/06/2023   No joint exam has been documented for this visit   There is currently no information documented on the homunculus. Go to the Rheumatology activity and complete the homunculus joint exam.  Investigation: No additional findings.  Imaging: No results found.  Recent Labs: Lab Results  Component Value Date   WBC 9.1 01/17/2023   HGB 11.0 (L) 01/17/2023   PLT 272 01/17/2023   NA 138 01/17/2023   K 3.0 (L) 01/17/2023   CL 101 01/17/2023   CO2 19 (L) 01/17/2023   GLUCOSE 132 (H) 01/17/2023   BUN 12 01/17/2023   CREATININE 1.21 (H) 01/17/2023   BILITOT 0.4 01/17/2023   ALKPHOS 70 01/17/2023   AST 19 01/17/2023   ALT <5 01/17/2023   PROT 7.2 01/17/2023   ALBUMIN 4.0 01/17/2023   CALCIUM 9.3 01/17/2023   GFRAA 58 (L) 08/06/2020    Speciality Comments: PLQ Eye Exam: 11/23/2021 WNL @ [facility] Follow up in 3-4 months.  Patient states she has her appointment scheduled for January 2025. Patient is on the cancellation list.  Procedures:  No procedures performed Allergies: Clindamycin/lincomycin, Codeine, Demerol [meperidine], Erythromycin, Iodinated contrast media, Meperidine hcl, Ondansetron hcl, Penicillins, Sulfonamide derivatives, and Zofran [ondansetron hcl]   Assessment / Plan:     Visit Diagnoses: No diagnosis found.  Orders: No orders of the defined types were placed in this encounter.  No orders of the defined types were placed in this encounter.   Face-to-face time spent with patient was *** minutes. Greater than 50% of time was spent in counseling and coordination of care.  Follow-Up Instructions: No follow-ups on file.   Ellen Henri, CMA  Note - This record has been created using Animal nutritionist.  Chart creation errors have been sought, but may not always  have been located. Such creation errors do not reflect on  the standard of medical care.

## 2023-06-06 ENCOUNTER — Ambulatory Visit: Payer: Medicare Other | Admitting: Rheumatology

## 2023-06-06 DIAGNOSIS — F32A Depression, unspecified: Secondary | ICD-10-CM

## 2023-06-06 DIAGNOSIS — M810 Age-related osteoporosis without current pathological fracture: Secondary | ICD-10-CM

## 2023-06-06 DIAGNOSIS — I10 Essential (primary) hypertension: Secondary | ICD-10-CM

## 2023-06-06 DIAGNOSIS — Z87442 Personal history of urinary calculi: Secondary | ICD-10-CM

## 2023-06-06 DIAGNOSIS — M19041 Primary osteoarthritis, right hand: Secondary | ICD-10-CM

## 2023-06-06 DIAGNOSIS — M47816 Spondylosis without myelopathy or radiculopathy, lumbar region: Secondary | ICD-10-CM

## 2023-06-06 DIAGNOSIS — Z87448 Personal history of other diseases of urinary system: Secondary | ICD-10-CM

## 2023-06-06 DIAGNOSIS — M3501 Sicca syndrome with keratoconjunctivitis: Secondary | ICD-10-CM

## 2023-06-06 DIAGNOSIS — Z8639 Personal history of other endocrine, nutritional and metabolic disease: Secondary | ICD-10-CM

## 2023-06-06 DIAGNOSIS — M3219 Other organ or system involvement in systemic lupus erythematosus: Secondary | ICD-10-CM

## 2023-06-06 DIAGNOSIS — M797 Fibromyalgia: Secondary | ICD-10-CM

## 2023-06-06 DIAGNOSIS — Z8719 Personal history of other diseases of the digestive system: Secondary | ICD-10-CM

## 2023-06-06 DIAGNOSIS — Z8669 Personal history of other diseases of the nervous system and sense organs: Secondary | ICD-10-CM

## 2023-06-06 DIAGNOSIS — E559 Vitamin D deficiency, unspecified: Secondary | ICD-10-CM

## 2023-06-06 DIAGNOSIS — Z79899 Other long term (current) drug therapy: Secondary | ICD-10-CM

## 2023-06-06 DIAGNOSIS — E785 Hyperlipidemia, unspecified: Secondary | ICD-10-CM

## 2023-06-06 DIAGNOSIS — M170 Bilateral primary osteoarthritis of knee: Secondary | ICD-10-CM

## 2023-06-06 DIAGNOSIS — K200 Eosinophilic esophagitis: Secondary | ICD-10-CM

## 2023-06-12 DIAGNOSIS — Z79899 Other long term (current) drug therapy: Secondary | ICD-10-CM | POA: Diagnosis not present

## 2023-06-12 DIAGNOSIS — H40023 Open angle with borderline findings, high risk, bilateral: Secondary | ICD-10-CM | POA: Diagnosis not present

## 2023-06-12 DIAGNOSIS — H53452 Other localized visual field defect, left eye: Secondary | ICD-10-CM | POA: Diagnosis not present

## 2023-06-12 DIAGNOSIS — H35341 Macular cyst, hole, or pseudohole, right eye: Secondary | ICD-10-CM | POA: Diagnosis not present

## 2023-06-13 ENCOUNTER — Other Ambulatory Visit: Payer: Self-pay | Admitting: Rheumatology

## 2023-06-13 DIAGNOSIS — M3219 Other organ or system involvement in systemic lupus erythematosus: Secondary | ICD-10-CM

## 2023-06-28 ENCOUNTER — Ambulatory Visit: Payer: Medicare Other | Admitting: Physician Assistant

## 2023-06-28 ENCOUNTER — Other Ambulatory Visit: Payer: Medicare Other

## 2023-06-28 ENCOUNTER — Encounter: Payer: Self-pay | Admitting: Physician Assistant

## 2023-06-28 VITALS — BP 120/66 | HR 65 | Ht 58.5 in | Wt 91.2 lb

## 2023-06-28 DIAGNOSIS — K582 Mixed irritable bowel syndrome: Secondary | ICD-10-CM

## 2023-06-28 DIAGNOSIS — R630 Anorexia: Secondary | ICD-10-CM | POA: Diagnosis not present

## 2023-06-28 DIAGNOSIS — K219 Gastro-esophageal reflux disease without esophagitis: Secondary | ICD-10-CM

## 2023-06-28 DIAGNOSIS — R195 Other fecal abnormalities: Secondary | ICD-10-CM

## 2023-06-28 LAB — COMPREHENSIVE METABOLIC PANEL
ALT: 3 U/L (ref 0–35)
AST: 14 U/L (ref 0–37)
Albumin: 4.2 g/dL (ref 3.5–5.2)
Alkaline Phosphatase: 79 U/L (ref 39–117)
BUN: 42 mg/dL — ABNORMAL HIGH (ref 6–23)
CO2: 20 meq/L (ref 19–32)
Calcium: 9.6 mg/dL (ref 8.4–10.5)
Chloride: 106 meq/L (ref 96–112)
Creatinine, Ser: 0.97 mg/dL (ref 0.40–1.20)
GFR: 56.99 mL/min — ABNORMAL LOW (ref >60.00)
Glucose, Bld: 129 mg/dL — ABNORMAL HIGH (ref 70–99)
Potassium: 3.8 meq/L (ref 3.5–5.1)
Sodium: 137 meq/L (ref 135–145)
Total Bilirubin: 0.4 mg/dL (ref 0.2–1.2)
Total Protein: 7.3 g/dL (ref 6.0–8.3)

## 2023-06-28 LAB — CBC WITH DIFFERENTIAL/PLATELET
Basophils Absolute: 0 10*3/uL (ref 0.0–0.1)
Basophils Relative: 0.2 % (ref 0.0–3.0)
Eosinophils Absolute: 0.4 10*3/uL (ref 0.0–0.7)
Eosinophils Relative: 5.1 % — ABNORMAL HIGH (ref 0.0–5.0)
HCT: 33.9 % — ABNORMAL LOW (ref 36.0–46.0)
Hemoglobin: 11 g/dL — ABNORMAL LOW (ref 12.0–15.0)
Lymphocytes Relative: 23 % (ref 12.0–46.0)
Lymphs Abs: 1.7 10*3/uL (ref 0.7–4.0)
MCHC: 32.6 g/dL (ref 30.0–36.0)
MCV: 87.6 fL (ref 78.0–100.0)
Monocytes Absolute: 0.7 10*3/uL (ref 0.1–1.0)
Monocytes Relative: 10.3 % (ref 3.0–12.0)
Neutro Abs: 4.4 10*3/uL (ref 1.4–7.7)
Neutrophils Relative %: 61.4 % (ref 43.0–77.0)
Platelets: 269 10*3/uL (ref 150.0–400.0)
RBC: 3.87 Mil/uL (ref 3.87–5.11)
RDW: 14.6 % (ref 11.5–15.5)
WBC: 7.2 10*3/uL (ref 4.0–10.5)

## 2023-06-28 NOTE — Patient Instructions (Signed)
Your provider has requested that you go to the basement level for lab work before leaving today. Press "B" on the elevator. The lab is located at the first door on the left as you exit the elevator.   We have sent the following medications to your pharmacy for you to pick up at your convenience: Dicyclomine 20 mg daily.  Restart Align daily.

## 2023-06-28 NOTE — Progress Notes (Signed)
Noted  

## 2023-06-28 NOTE — Progress Notes (Signed)
Chief Complaint: Dark diarrhea  HPI:    Veronica Jordan is a 77 year old female with a past medical history as listed below including diabetes, fibromyalgia, GERD, IBS and multiple others as well as lupus, known to Dr. Marina Goodell, who was referred to me by Veronica Polka., Veronica Jordan for a complaint of dark diarrhea.      03/17/2011 colonoscopy with moderate diverticulosis in the sigmoid colon otherwise normal.  Repeat recommended 10 years.    01/18/2023 CT abdomen pelvis without contrast showed dilation of the common bile duct up to 1.2 cm and recommended ultrasound also diverticulosis and a small hiatal hernia.    01/18/2023 right upper quadrant ultrasound with status post cholecystectomy and intra and extrahepatic biliary dilation, CBD measuring 13 mm.    03/11/2023 ER visit for chest pain.  At that time found to have blood pressures 214/59.  At that time thought to have chest pain secondary to hypertensive urgency.    03/11/2023 TSH and troponin normal, CMP with a potassium low at 2.7 and decreased AST and ALT.    03/13/2023 BMP normal, magnesium normal.    04/01/2023 MRCP showed status post cholecystectomy with biliary ductal dilation and common hepatic duct measuring up to 1.1 cm in caliber, CBD tapered smoothly without calculus or other obstruction, consistent with benign postoperative biliary ductal dilation and unchanged when compared to previous imaging back to 2019, no other acute findings.    04/04/2023 video visit with her neurologist, at that time discussed dysphagia within normal MVE on 10/24/2019, but speech therapist did note that solids remained in the mid to distal esophagus for a prolonged period and recommended GI referral.  Described her symptoms stable since that time.  Discussed lifelong problems with IBS.    04/12/2023 patient seen in clinic by me and at that time following up after an ER visit for nausea, vomiting and IBS symptoms.  That time doing fairly well.  At that time all symptoms had  resolved.  Recent imaging it showed dilated bile ducts which was similar over the past 5 years, LFTs normal.  Told her to continue doing what she was doing including protein shakes.    05/22/2023 CMP with a glucose of 123 and BUN minimally elevated at 34, otherwise normal, hemoglobin 10.7 with a normal MCV.    Today, the patient tells me over the past 2 weeks she has had a lot of irritable bowel.  Apparently was having straight diarrhea 3-4 times a day that was black in color and very watery, on Sunday, 06/24/2023 was very bad and she had more than 5 stools, she was taking Kaopectate and then did not have a stool at all until today, this morning she passed a normal Hittle stool but feels like she may have more diarrhea later.  In the past 3 days she has actually felt okay, but notes a decrease in appetite and just some pressure in her abdomen in general.  Currently drinking protein shakes twice a day.  She is worried about the dark coloration of stool otherwise all of the symptoms feel consistent with her IBS.  Not currently using her Dicyclomine.  Still on Pantoprazole daily.  Ells me that fiber makes diarrhea worse.    Denies fever or chills.     Past Medical History:  Diagnosis Date   Allergic rhinitis    Anemia    Anxiety and depression    Depression    Diabetes mellitus    Diverticulitis    Fibromyalgia  GERD (gastroesophageal reflux disease)    History of gallstones    History of kidney stones    Hyperlipemia    Hypertension    Hypothyroidism    IBS (irritable bowel syndrome)    Osteoarthritis    Osteoporosis    Renal calculi    Sjogren's syndrome (HCC)    Systemic lupus (HCC) 2001    Past Surgical History:  Procedure Laterality Date   APPENDECTOMY  1970   BREAST BIOPSY Left 08/2020   BREAST BIOPSY Left 01/26/2023   MM LT BREAST BX W LOC DEV 1ST LESION IMAGE BX SPEC STEREO GUIDE 01/26/2023 [facility]   BREAST EXCISIONAL BIOPSY Left    CATARACT EXTRACTION     bilateral    CESAREAN SECTION  1976, 1977   x2   CHOLECYSTECTOMY  1988   TONSILLECTOMY AND ADENOIDECTOMY     TOTAL ABDOMINAL HYSTERECTOMY  2000   BSO due to fibroids    Current Outpatient Medications  Medication Sig Dispense Refill   albuterol (VENTOLIN HFA) 108 (90 Base) MCG/ACT inhaler      ALPRAZolam (XANAX) 0.25 MG tablet Take 1 tablet (0.25 mg total) by mouth 2 (two) times daily as needed for anxiety. 15 tablet 0   aspirin 81 MG tablet Take 81 mg by mouth daily.     brimonidine (ALPHAGAN P) 0.1 % SOLN      busPIRone (BUSPAR) 10 MG tablet Take 10 mg by mouth 2 (two) times daily.     Calcium Carbonate-Vitamin D 600-400 MG-UNIT tablet Take 1 tablet by mouth daily.     Carbidopa-Levodopa ER (SINEMET CR) 25-100 MG tablet controlled release Take 2 tablets 3 times per day (she takes at 10am/2pm/6pm) 540 tablet 0   Coenzyme Q10 (COQ-10) 50 MG CAPS Take 1 capsule by mouth daily.     dicyclomine (BENTYL) 20 MG tablet Take 20 mg by mouth every 6 (six) hours.     fluticasone (FLONASE) 50 MCG/ACT nasal spray as needed.     hydroxychloroquine (PLAQUENIL) 200 MG tablet TAKE 1 TABLET DAILY FOR SYSTEMIC LUPUS ERYTHEMATOSUS 90 tablet 0   levothyroxine (SYNTHROID, LEVOTHROID) 25 MCG tablet Take 25 mcg by mouth daily.     loratadine (CLARITIN) 10 MG tablet Take 10 mg by mouth daily.     losartan (COZAAR) 100 MG tablet Take 100 mg by mouth daily.     Omega-3 Fatty Acids (OMEGA 3 PO) Take by mouth daily.     pantoprazole (PROTONIX) 40 MG tablet Take 40 mg by mouth daily.      potassium chloride SA (KLOR-CON M) 20 MEQ tablet Take 20 mEq by mouth daily.     sertraline (ZOLOFT) 25 MG tablet Take 25 mg by mouth daily.     simvastatin (ZOCOR) 20 MG tablet Take 20 mg by mouth at bedtime.     triamcinolone cream (KENALOG) 0.1 % Apply topically as needed.     valsartan (DIOVAN) 320 MG tablet Take 320 mg by mouth daily.     vitamin B-12 (CYANOCOBALAMIN) 1000 MCG tablet Take 1,000 mcg by mouth daily.     No current  facility-administered medications for this visit.    Allergies as of 06/28/2023 - Review Complete 06/28/2023  Allergen Reaction Noted   Clindamycin/lincomycin  08/21/2012   Codeine Itching 08/09/2011   Demerol [meperidine] Nausea And Vomiting 03/30/2020   Erythromycin  12/13/2007   Iodinated contrast media  08/25/2021   Meperidine hcl  12/13/2007   Ondansetron hcl  12/13/2007   Penicillins  12/13/2007  Sulfonamide derivatives  12/13/2007   Zofran [ondansetron hcl]  11/06/2012    Family History  Problem Relation Age of Onset   Asthma Mother    COPD Mother    Heart disease Father    Asthma Sister    Parkinsonism Other        MGM   Hypertension Brother    Heart attack Brother    Arthritis Maternal Grandmother    Heart disease Maternal Grandfather    Lupus Paternal Grandmother    Heart attack Paternal Grandfather    Hypertension Sister    Chronic fatigue Daughter    Colon cancer Neg Hx    Esophageal cancer Neg Hx    Stomach cancer Neg Hx     Social History   Socioeconomic History   Marital status: Married    Spouse name: Not on file   Number of children: 2   Years of education: Not on file   Highest education level: Associate degree: academic program  Occupational History   Occupation: Event organiser: UNEMPLOYED    Comment: retired  Tobacco Use   Smoking status: Never    Passive exposure: Never   Smokeless tobacco: Never  Vaping Use   Vaping status: Never Used  Substance and Sexual Activity   Alcohol use: Yes    Comment: rarely wine   Drug use: No   Sexual activity: Not Currently    Birth control/protection: Post-menopausal, Surgical    Comment: hysterectomy  Other Topics Concern   Not on file  Social History Narrative   Youngest daughter died in MVA.   Right handed    Lives in a two story home that has a basement   Social Drivers of Health   Financial Resource Strain: Not on file  Food Insecurity: Low Risk  (03/12/2023)    Received from [facility]   Hunger Vital Sign    Worried About Running Out of Food in the Last Year: Never true    Ran Out of Food in the Last Year: Never true  Transportation Needs: No Transportation Needs (03/12/2023)   Received from [facility]    In the past 12 months, has lack of reliable transportation kept you from medical appointments, meetings, work or from getting things needed for daily living? : No  Physical Activity: Not on file  Stress: Not on file  Social Connections: Not on file  Intimate Partner Violence: Not on file    Review of Systems:    Constitutional: No fever or chills  Cardiovascular: No chest pain Respiratory: No SOB  Gastrointestinal: See HPI and otherwise negative   Physical Exam:  Vital signs: BP 120/66   Pulse 65   Ht 4' 10.5" (1.486 m)   Wt 91 lb 3.2 oz (41.4 kg)   LMP 07/03/1998 (Approximate)   SpO2 96%   BMI 18.74 kg/m   Constitutional:   Pleasant Elderly Caucasian female appears to be in NAD, Well developed, Well nourished, alert and cooperative Respiratory: Respirations even and unlabored. Lungs clear to auscultation bilaterally.   No wheezes, crackles, or rhonchi.  Cardiovascular: Normal S1, S2. No MRG. Regular rate and rhythm. No peripheral edema, cyanosis or pallor.  Gastrointestinal:  Soft, nondistended, nontender. No rebound or guarding. Normal bowel sounds. No appreciable masses or hepatomegaly. Rectal:  Not performed.  Psychiatric: Demonstrates good judgement and reason without abnormal affect or behaviors.  RELEVANT LABS AND IMAGING: CBC    Component Value Date/Time   WBC 9.1  01/17/2023 1938   RBC 3.75 (L) 01/17/2023 1938   HGB 11.0 (L) 01/17/2023 1938   HGB 12.0 01/13/2021 1115   HCT 34.7 (L) 01/17/2023 1938   HCT 37.7 01/13/2021 1115   PLT 272 01/17/2023 1938   PLT 244 01/13/2021 1115   MCV 92.5 01/17/2023 1938   MCV 91 01/13/2021 1115   MCH 29.3 01/17/2023 1938   MCHC 31.7 01/17/2023 1938   RDW 13.1  01/17/2023 1938   RDW 12.6 01/13/2021 1115   LYMPHSABS 1,245 01/01/2023 1337   LYMPHSABS 1.9 01/13/2021 1115   EOSABS 188 01/01/2023 1337   EOSABS 0.3 01/13/2021 1115   BASOSABS 68 01/01/2023 1337   BASOSABS 0.1 01/13/2021 1115    CMP     Component Value Date/Time   NA 138 01/17/2023 1938   NA 141 01/13/2021 1115   K 3.0 (L) 01/17/2023 1938   CL 101 01/17/2023 1938   CO2 19 (L) 01/17/2023 1938   GLUCOSE 132 (H) 01/17/2023 1938   BUN 12 01/17/2023 1938   BUN 16 01/13/2021 1115   CREATININE 1.21 (H) 01/17/2023 1938   CREATININE 1.03 (H) 01/01/2023 1337   CALCIUM 9.3 01/17/2023 1938   PROT 7.2 01/17/2023 1938   PROT 6.7 01/13/2021 1115   ALBUMIN 4.0 01/17/2023 1938   ALBUMIN 4.1 01/13/2021 1115   AST 19 01/17/2023 1938   ALT <5 01/17/2023 1938   ALKPHOS 70 01/17/2023 1938   BILITOT 0.4 01/17/2023 1938   BILITOT 0.4 01/13/2021 1115   GFRNONAA 47 (L) 01/17/2023 1938   GFRNONAA 50 (L) 08/06/2020 1417   GFRAA 58 (L) 08/06/2020 1417    Assessment: 1.  IBS-mixed: Tends to radiate back-and-forth from diarrhea to constipation, fiber makes her diarrhea worse, currently in a flare of loose stool that sounds like other than this morning when it was hard and normal 2.  Decreased appetite and eructations: Likely with diarrhea as above 3.  GERD: Controlled on Pantoprazole 40 mg daily 4.  Dark stool: Patient describes dark stool when she was having diarrhea over the past couple of weeks, no diarrhea over the past 4 days; consider food versus blood  Plan: 1.  Sounds like patient had a flare of her IBS.  Recommend she start using her Dicyclomine 20 mg once daily for now and then as needed for abdominal cramping. 2.  Ordered a CBC and CMP today to check blood count given dark school description. 3.  Discussed starting Align probiotic daily which she has at home. 4.  Patient will call and let us know if her symptoms do not get better over the next couple of weeks. 5.  Patient to follow in  clinic with [place] as needed.  Hyacinth Meeker, PA-C [facility] 06/28/2023, 11:04 AM  Cc: Veronica Polka., Veronica Jordan

## 2023-06-29 MED ORDER — DICYCLOMINE HCL 20 MG PO TABS: 20.0000 mg | ORAL_TABLET | Freq: Every day | ORAL | 5 refills | Status: DC

## 2023-06-29 NOTE — Addendum Note (Signed)
Addended by: Mariane Duval on: 06/29/2023 02:36 PM   Modules accepted: Orders

## 2023-07-05 NOTE — Progress Notes (Signed)
Office Visit Note  Patient: Veronica Jordan             Date of Birth: 02/23/1946           MRN: [id number]             PCP: Cleatis Polka., MD Referring: Cleatis Polka., MD Visit Date: 07/19/2023 Occupation: @GUAROCC @  Subjective:  Medication monitoring   History of Present Illness: Veronica Jordan is a 78 y.o. female with history of systemic lupus and nephritis.  Patient remains on Plaquenil 200 mg 1 tablet by mouth daily.  Patient continues to tolerate Plaquenil without any side effects and has not had any recent gaps in therapy.  Patient reports that she was hospitalized in September 2024 after presenting with chest pain.  Patient reports that she was found to have low potassium and elevated blood pressure.  Patient reports that she has had ongoing fatigue since being hospitalized but has gradually been gaining her strength back.  She has been trying to drink 2 protein shakes per day since her appetite has started to increase.  Patient reports that her anxiety has also improved since being started on Zoloft and BuSpar.  Patient denies any signs or symptoms of a systemic lupus flare since her last office visit.  She has not had any symptoms of Raynaud's phenomenon.  She denies any recent rashes.  She denies any oral or nasal ulcerations.  She continues to have chronic sicca symptoms and has been using over-the-counter products for symptomatic relief.  She experiences some increased joint stiffness with colder weather temperatures but denies any joint swelling.  Activities of Daily Living:  Patient reports morning stiffness for 20 minutes.   Patient Reports nocturnal pain.  Difficulty dressing/grooming: Denies Difficulty climbing stairs: Reports Difficulty getting out of chair: Denies Difficulty using hands for taps, buttons, cutlery, and/or writing: Denies  Review of Systems  Constitutional:  Positive for fatigue.  HENT:  Positive for mouth dryness. Negative for mouth sores.    Eyes:  Positive for dryness.  Respiratory:  Positive for shortness of breath.   Cardiovascular:  Positive for chest pain and palpitations.  Gastrointestinal:  Negative for blood in stool, constipation and diarrhea.  Endocrine: Negative for increased urination.  Genitourinary:  Positive for involuntary urination.  Musculoskeletal:  Positive for joint pain, joint pain, myalgias, morning stiffness, muscle tenderness and myalgias. Negative for gait problem, joint swelling and muscle weakness.  Skin:  Positive for rash, hair loss and sensitivity to sunlight. Negative for color change.  Allergic/Immunologic: Negative for susceptible to infections.  Neurological:  Positive for dizziness. Negative for headaches.  Hematological:  Negative for swollen glands.  Psychiatric/Behavioral:  Positive for depressed mood and sleep disturbance. The patient is nervous/anxious.     PMFS History:  Patient Active Problem List   Diagnosis Date Noted   Sjogren's syndrome with keratoconjunctivitis sicca (HCC) 08/07/2018   History of nephritis 08/07/2018   History of optic neuritis 08/07/2018   Fibromyalgia 08/07/2018   Primary osteoarthritis of both hands 08/07/2018   Primary osteoarthritis of both knees 08/07/2018   DDD (degenerative disc disease), lumbar 08/07/2018   Eosinophilic esophagitis 08/07/2018   History of IBS 08/07/2018   History of gastroesophageal reflux (GERD) 08/07/2018   History of diverticulosis 08/07/2018   History of type 2 diabetes mellitus 08/07/2018   History of kidney stones 08/07/2018   History of peripheral neuropathy 08/07/2018   Dyslipidemia 08/07/2018   Anxiety and depression 08/07/2018  Essential tremor 08/07/2018   Postmenopausal hormone replacement therapy 03/05/2015   Osteoporosis 03/05/2015   Atrophic vaginitis 03/05/2015   HYPERTHYROIDISM 12/13/2007   Hypercholesterolemia 12/13/2007   Essential hypertension 12/13/2007   ALLERGIC RHINITIS 12/13/2007   LUPUS  ERYTHEMATOSUS 12/13/2007   DYSPNEA 12/13/2007    Past Medical History:  Diagnosis Date   Allergic rhinitis    Anemia    Anxiety and depression    Depression    Diabetes mellitus    Diverticulitis    Fibromyalgia    GERD (gastroesophageal reflux disease)    History of gallstones    History of kidney stones    Hyperlipemia    Hypertension    Hypothyroidism    IBS (irritable bowel syndrome)    Osteoarthritis    Osteoporosis    Renal calculi    Sjogren's syndrome (HCC)    Systemic lupus (HCC) 2001    Family History  Problem Relation Age of Onset   Asthma Mother    COPD Mother    Heart disease Father    Asthma Sister    Hypertension Sister    Hypertension Brother    Heart attack Brother    Arthritis Maternal Grandmother    Heart disease Maternal Grandfather    Lupus Paternal Grandmother    Heart attack Paternal Grandfather    Chronic fatigue Daughter    Parkinsonism Other        MGM   Colon cancer Neg Hx    Esophageal cancer Neg Hx    Stomach cancer Neg Hx    Past Surgical History:  Procedure Laterality Date   APPENDECTOMY  1970   BREAST BIOPSY Left 08/2020   BREAST BIOPSY Left 01/26/2023   MM LT BREAST BX W LOC DEV 1ST LESION IMAGE BX SPEC STEREO GUIDE 01/26/2023 [facility]   BREAST EXCISIONAL BIOPSY Left    CATARACT EXTRACTION     bilateral   CESAREAN SECTION  1976, 1977   x2   CHOLECYSTECTOMY  1988   TONSILLECTOMY AND ADENOIDECTOMY     TOTAL ABDOMINAL HYSTERECTOMY  2000   BSO due to fibroids   Social History   Social History Narrative   Youngest daughter died in MVA.   Right handed    Lives in a two story home that has a basement   Immunization History  Administered Date(s) Administered   PFIZER(Purple Top)SARS-COV-2 Vaccination 08/29/2019, 09/26/2019     Objective: Vital Signs: BP 99/60 (BP Location: Left Arm, Patient Position: Sitting, Cuff Size: Normal)   Pulse 60   Resp 17   Ht 4' 11.5" (1.511 m)   Wt 103 lb (46.7 kg)   LMP  07/03/1998 (Approximate)   BMI 20.46 kg/m    Physical Exam Vitals and nursing note reviewed.  Constitutional:      Appearance: She is well-developed.  HENT:     Head: Normocephalic and atraumatic.  Eyes:     Conjunctiva/sclera: Conjunctivae normal.  Cardiovascular:     Rate and Rhythm: Normal rate and regular rhythm.     Heart sounds: Normal heart sounds.  Pulmonary:     Effort: Pulmonary effort is normal.     Breath sounds: Normal breath sounds.  Abdominal:     General: Bowel sounds are normal.     Palpations: Abdomen is soft.  Musculoskeletal:     Cervical back: Normal range of motion.  Lymphadenopathy:     Cervical: No cervical adenopathy.  Skin:    General: Skin is warm and dry.  Capillary Refill: Capillary refill takes less than 2 seconds.  Neurological:     Mental Status: She is alert and oriented to person, place, and time.  Psychiatric:        Behavior: Behavior normal.      Musculoskeletal Exam: C-spine has slightly limited range of motion with lateral rotation.  Limited mobility of the lumbar spine.  Shoulder joints, elbow joints, wrist joints, MCPs, PIPs, DIPs have good range of motion with no synovitis.  PIP and DIP thickening consistent with osteoarthritis of both hands.  Hip joints have good range of motion with no groin pain.  Knee joints have good range of motion no warmth or effusion.  For palpated on the anterior aspect of the left patella.  Ankle joints have good range of motion no tenderness or joint swelling.  CDAI Exam: CDAI Score: -- Patient Global: --; Provider Global: -- Swollen: --; Tender: -- Joint Exam 07/19/2023   No joint exam has been documented for this visit   There is currently no information documented on the homunculus. Go to the Rheumatology activity and complete the homunculus joint exam.  Investigation: No additional findings.  Imaging: No results found.  Recent Labs: Lab Results  Component Value Date   WBC 7.2  06/28/2023   HGB 11.0 (L) 06/28/2023   PLT 269.0 06/28/2023   NA 137 06/28/2023   K 3.8 06/28/2023   CL 106 06/28/2023   CO2 20 06/28/2023   GLUCOSE 129 (H) 06/28/2023   BUN 42 (H) 06/28/2023   CREATININE 0.97 06/28/2023   BILITOT 0.4 06/28/2023   ALKPHOS 79 06/28/2023   AST 14 06/28/2023   ALT 3 06/28/2023   PROT 7.3 06/28/2023   ALBUMIN 4.2 06/28/2023   CALCIUM 9.6 06/28/2023   GFRAA 58 (L) 08/06/2020    Speciality Comments: PLQ Eye Exam: 06/12/2023 WNL @ [facility] Follow up in 4-6 months.  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  She has clinically been doing well taking Plaquenil 200 mg 1 tablet by mouth daily.  She is tolerating Plaquenil without any side effects and has not had any interruptions in therapy.  She has no synovitis on examination today.  She has not had any recent rashes or symptoms of Raynaud's phenomenon.  She has not had any oral or nasal ulcerations.  She continues to have chronic sicca symptoms which have been stable overall. Lab work from 01/01/23 was reviewed today in the office: ESR 31, double-stranded ENA 12--ending down, complements within normal limits, total urine protein 65, protein creatinine ratio within normal limits.  Obtain the following lab work today for further evaluation.  Patient will remain on Plaquenil as prescribed.  She was advised to notify us if she develops signs or symptoms of a flare.  She will follow-up in the office in 5 months or  sooner if needed. - Plan: Protein / creatinine ratio, urine, Anti-DNA antibody, double-stranded, C3 and C4, Sedimentation rate  High risk medication use - Plaquenil 200 mg 1 tablet by mouth daily.  PLQ Eye Exam: 06/12/2023 WNL @ [facility]  CBC and CMP updated on 06/28/23.  She will continue to require updated lab work every 5 months.  History of optic neuritis - Followed by Dr. Jimmey Ralph with [facility].  History of nephritis - Under care of Nephrology.  Urine protein creatinine ratio will be obtained today.  Sjogren's syndrome with keratoconjunctivitis sicca (HCC) - ANA+, Ro+, La+, positive RF: Patient continues to have chronic sicca symptoms.  She remains on Plaquenil 200 mg 1 tablet by mouth daily.  She was advised to notify us if she develops any new or worsening symptoms. Plan to check SPEP today.  Primary osteoarthritis of both hands: She has PIP and DIP thickening consistent with osteoarthritis of both hands.  Complete fist formation noted bilaterally.  No synovitis noted.  Discussed the importance of joint protection and muscle strengthening.  Primary osteoarthritis of both knees: She has good range of motion of both knee joints on examination today.  No warmth or effusion noted.  A small spur was noted on the anterior aspect of the left patella-examined by Dr. Corliss Skains as well.  Patient will notify us if the spur grows in size or if she develops any new symptoms.    Degeneration of intervertebral disc of lumbar region without discogenic back pain or lower extremity pain: Chronic pain.  Limited mobility.  Age-related osteoporosis without current pathological fracture - DEXA ordered by PCP. DEXA normal on 05/26/14--DEXA in epic to review.  Patient is taking calcium and vitamin D supplement daily.  Vitamin D deficiency: She is taking a calcium and vitamin D supplement daily.  Fibromyalgia: Patient experiences intermittent myalgias and muscle tenderness due to  fibromyalgia.  Overall her symptoms have been manageable.  She continues to have persistent fatigue.   Other medical conditions are listed as follows:  Dyslipidemia  History of IBS  Anxiety and depression: Her anxiety has been significantly better controlled since initiating Zoloft and taking BuSpar as prescribed.  She has also been working with a Veterinary surgeon every 2 weeks.  History of type 2 diabetes mellitus  Eosinophilic esophagitis  History of kidney stones  Essential hypertension: Blood pressure was 99/68 today in the office.  History of gastroesophageal reflux (GERD)  History of diverticulosis  History of Parkinson's disease  Orders: Orders Placed This Encounter  Procedures   Protein / creatinine ratio, urine   Anti-DNA antibody, double-stranded   C3 and C4   Sedimentation rate   No orders of the defined types were placed in this encounter.   Follow-Up Instructions: Return in about 5 months (around 12/17/2023) for Systemic lupus erythematosus.   Gearldine Bienenstock, PA-C  Note - This record has been created using Dragon software.  Chart creation errors have been sought, but may not always  have been located. Such creation errors do not reflect on  the standard of medical care.

## 2023-07-11 ENCOUNTER — Encounter: Payer: Self-pay | Admitting: Neurology

## 2023-07-11 ENCOUNTER — Other Ambulatory Visit: Payer: Self-pay | Admitting: Physician Assistant

## 2023-07-11 ENCOUNTER — Other Ambulatory Visit: Payer: Self-pay

## 2023-07-11 DIAGNOSIS — G20A1 Parkinson's disease without dyskinesia, without mention of fluctuations: Secondary | ICD-10-CM

## 2023-07-11 DIAGNOSIS — M3219 Other organ or system involvement in systemic lupus erythematosus: Secondary | ICD-10-CM

## 2023-07-11 DIAGNOSIS — G20B2 Parkinson's disease with dyskinesia, with fluctuations: Secondary | ICD-10-CM

## 2023-07-11 MED ORDER — CARBIDOPA-LEVODOPA ER 25-100 MG PO TBCR: EXTENDED_RELEASE_TABLET | ORAL | 0 refills | Status: DC

## 2023-07-11 NOTE — Telephone Encounter (Signed)
 Last Fill: 04/03/2023  Eye exam: 06/12/2023 WNL   Labs: 06/28/2023 Glucose 129, BUN 42, GFR 56.99, Hgb 11.0, Hct 33.9, Eosinophils Relative 5.1  Next Visit: 07/19/2023  Last Visit: 01/01/2023  DX: Other systemic lupus erythematosus with other organ involvement   Current Dose per office note 01/01/2023:  Plaquenil  200 mg 1 tablet by mouth daily.   Okay to refill Plaquenil ?

## 2023-07-18 ENCOUNTER — Other Ambulatory Visit: Payer: Self-pay | Admitting: *Deleted

## 2023-07-18 MED ORDER — DICYCLOMINE HCL 20 MG PO TABS: 20.0000 mg | ORAL_TABLET | Freq: Every day | ORAL | 1 refills | Status: DC

## 2023-07-19 ENCOUNTER — Ambulatory Visit: Payer: Medicare Other | Attending: Physician Assistant | Admitting: Physician Assistant

## 2023-07-19 ENCOUNTER — Encounter: Payer: Self-pay | Admitting: Physician Assistant

## 2023-07-19 VITALS — BP 99/60 | HR 60 | Resp 17 | Ht 59.5 in | Wt 103.0 lb

## 2023-07-19 DIAGNOSIS — E785 Hyperlipidemia, unspecified: Secondary | ICD-10-CM | POA: Insufficient documentation

## 2023-07-19 DIAGNOSIS — M3219 Other organ or system involvement in systemic lupus erythematosus: Secondary | ICD-10-CM | POA: Insufficient documentation

## 2023-07-19 DIAGNOSIS — F419 Anxiety disorder, unspecified: Secondary | ICD-10-CM | POA: Diagnosis not present

## 2023-07-19 DIAGNOSIS — Z79899 Other long term (current) drug therapy: Secondary | ICD-10-CM | POA: Insufficient documentation

## 2023-07-19 DIAGNOSIS — Z8669 Personal history of other diseases of the nervous system and sense organs: Secondary | ICD-10-CM | POA: Insufficient documentation

## 2023-07-19 DIAGNOSIS — K200 Eosinophilic esophagitis: Secondary | ICD-10-CM | POA: Diagnosis not present

## 2023-07-19 DIAGNOSIS — M51369 Other intervertebral disc degeneration, lumbar region without mention of lumbar back pain or lower extremity pain: Secondary | ICD-10-CM | POA: Insufficient documentation

## 2023-07-19 DIAGNOSIS — I10 Essential (primary) hypertension: Secondary | ICD-10-CM | POA: Insufficient documentation

## 2023-07-19 DIAGNOSIS — Z8639 Personal history of other endocrine, nutritional and metabolic disease: Secondary | ICD-10-CM | POA: Diagnosis not present

## 2023-07-19 DIAGNOSIS — M19042 Primary osteoarthritis, left hand: Secondary | ICD-10-CM | POA: Insufficient documentation

## 2023-07-19 DIAGNOSIS — E559 Vitamin D deficiency, unspecified: Secondary | ICD-10-CM | POA: Insufficient documentation

## 2023-07-19 DIAGNOSIS — M19041 Primary osteoarthritis, right hand: Secondary | ICD-10-CM | POA: Insufficient documentation

## 2023-07-19 DIAGNOSIS — M797 Fibromyalgia: Secondary | ICD-10-CM | POA: Insufficient documentation

## 2023-07-19 DIAGNOSIS — Z8719 Personal history of other diseases of the digestive system: Secondary | ICD-10-CM | POA: Diagnosis not present

## 2023-07-19 DIAGNOSIS — Z87448 Personal history of other diseases of urinary system: Secondary | ICD-10-CM | POA: Insufficient documentation

## 2023-07-19 DIAGNOSIS — M810 Age-related osteoporosis without current pathological fracture: Secondary | ICD-10-CM | POA: Insufficient documentation

## 2023-07-19 DIAGNOSIS — Z87442 Personal history of urinary calculi: Secondary | ICD-10-CM | POA: Diagnosis not present

## 2023-07-19 DIAGNOSIS — M170 Bilateral primary osteoarthritis of knee: Secondary | ICD-10-CM | POA: Insufficient documentation

## 2023-07-19 DIAGNOSIS — M3501 Sicca syndrome with keratoconjunctivitis: Secondary | ICD-10-CM | POA: Diagnosis not present

## 2023-07-19 DIAGNOSIS — F32A Depression, unspecified: Secondary | ICD-10-CM | POA: Diagnosis not present

## 2023-07-20 NOTE — Progress Notes (Signed)
ESR WNL  Urine protein creatinine ratio WNL

## 2023-07-20 NOTE — Progress Notes (Signed)
Complements WNL

## 2023-07-21 LAB — PROTEIN ELECTROPHORESIS, SERUM, WITH REFLEX
Albumin ELP: 4.1 g/dL (ref 3.8–4.8)
Alpha 1: 0.3 g/dL (ref 0.2–0.3)
Alpha 2: 0.9 g/dL (ref 0.5–0.9)
Beta 2: 0.3 g/dL (ref 0.2–0.5)
Beta Globulin: 0.5 g/dL (ref 0.4–0.6)
Gamma Globulin: 1 g/dL (ref 0.8–1.7)
Total Protein: 7.2 g/dL (ref 6.1–8.1)

## 2023-07-21 LAB — ANTI-DNA ANTIBODY, DOUBLE-STRANDED: ds DNA Ab: 9 [IU]/mL — ABNORMAL HIGH

## 2023-07-21 LAB — SEDIMENTATION RATE: Sed Rate: 28 mm/h (ref 0–30)

## 2023-07-21 LAB — C3 AND C4
C3 Complement: 149 mg/dL (ref 83–193)
C4 Complement: 17 mg/dL (ref 15–57)

## 2023-07-21 LAB — PROTEIN / CREATININE RATIO, URINE
Creatinine, Urine: 119 mg/dL (ref 20–275)
Protein/Creat Ratio: 151 mg/g{creat} (ref 24–184)
Protein/Creatinine Ratio: 0.151 mg/mg{creat} (ref 0.024–0.184)
Total Protein, Urine: 18 mg/dL (ref 5–24)

## 2023-07-22 NOTE — Progress Notes (Signed)
dsDNA is within indeterminate range-9-continues to trend down.  Labs are not consistent with a flare/active disease.

## 2023-08-16 DIAGNOSIS — K582 Mixed irritable bowel syndrome: Secondary | ICD-10-CM | POA: Diagnosis not present

## 2023-08-16 DIAGNOSIS — E1129 Type 2 diabetes mellitus with other diabetic kidney complication: Secondary | ICD-10-CM | POA: Diagnosis not present

## 2023-08-16 DIAGNOSIS — E785 Hyperlipidemia, unspecified: Secondary | ICD-10-CM | POA: Diagnosis not present

## 2023-08-16 DIAGNOSIS — G20A1 Parkinson's disease without dyskinesia, without mention of fluctuations: Secondary | ICD-10-CM | POA: Diagnosis not present

## 2023-08-16 DIAGNOSIS — K838 Other specified diseases of biliary tract: Secondary | ICD-10-CM | POA: Diagnosis not present

## 2023-08-16 DIAGNOSIS — M810 Age-related osteoporosis without current pathological fracture: Secondary | ICD-10-CM | POA: Diagnosis not present

## 2023-08-16 DIAGNOSIS — I129 Hypertensive chronic kidney disease with stage 1 through stage 4 chronic kidney disease, or unspecified chronic kidney disease: Secondary | ICD-10-CM | POA: Diagnosis not present

## 2023-08-16 DIAGNOSIS — D649 Anemia, unspecified: Secondary | ICD-10-CM | POA: Diagnosis not present

## 2023-08-16 DIAGNOSIS — M3210 Systemic lupus erythematosus, organ or system involvement unspecified: Secondary | ICD-10-CM | POA: Diagnosis not present

## 2023-08-16 DIAGNOSIS — N1831 Chronic kidney disease, stage 3a: Secondary | ICD-10-CM | POA: Diagnosis not present

## 2023-08-16 DIAGNOSIS — K644 Residual hemorrhoidal skin tags: Secondary | ICD-10-CM | POA: Diagnosis not present

## 2023-08-16 DIAGNOSIS — I4581 Long QT syndrome: Secondary | ICD-10-CM | POA: Diagnosis not present

## 2023-09-25 NOTE — Progress Notes (Unsigned)
 Assessment/Plan:   1.  Parkinsons Disease with family history of PD             -does not want genetic testing             - carbidopa/levodopa 25/100 CR, 2 tablet 3 times per day.  She is currently taking last one at bed and discussed to move them to 4 hour intervals (10am/2pm/6pm)             -Sounds like she is having some mild dyskinesia.  Discussed nature and pathophysiology.  We ultimately decided that it would not be in her best interest to add more medication right now.   2.  Depression, Anxiety and Panic Attacks             -Following with primary care             -Has had prolonged QT due to possibly Lexapro and it is now d/c             -psychiatry in hospital started the patient on BuSpar, 10 mg twice per day and low-dose sertraline, 25 mg daily.             -Saw psychiatry in the hospital in September, 2024 and it was recommended that she follow closely with psychiatry as an outpatient.  She was given names, but has not called the names yet for a follow-up appointment.  I encouraged her to do that.   3.  Dysphagia             -MBE on October 24, 2019 was normal, but speech therapist noted that solids remained in the mid to distal esophagus for prolonged period and GI referral was recommended.  Patient declined.  She feels she has been stable since that time.   4.  Sjogren's, lupus, fibromyalgia             -Follows with Dr. Corliss Skains   5.  RBD             -Doing fairly well in that regard.   6.  IBS-mixed, lifelong             -she asked if associated with Parkinsons Disease.  Discussed constipation is associated with Parkinsons Disease but not the IBS.  She has had lifelong issues and its getting worse.  She is f/u with [facility]  -[facility] noted that N/V is likely associated with anxiety.   7.  Weight loss             -happy to report she has gained 15 lbs since our last visit   Subjective:   Veronica Jordan was seen today in follow up  for Parkinsons disease.  My previous records were reviewed prior to todays visit as well as outside records available to me.  She is taking levodopa as directed.  She has seen [facility] since our last visit.  Notes reviewed.  They are treating for IBS-mixed as well as nausea and vomiting, which they thought was related to anxiety.  She is following with psychology but hasn't been able to see psychiatry per pt.  She is on the buspar and sertraline.    Current prescribed movement disorder medications: Continue carbidopa/levodopa 25/100 CR, 2 tablet 3 times daily    ALLERGIES:   Allergies  Allergen Reactions   Clindamycin/Lincomycin     hives   Codeine Itching   Demerol [Meperidine] Nausea And Vomiting   Erythromycin  Iodinated Contrast Media    Meperidine Hcl    Ondansetron Hcl    Penicillins    Sulfonamide Derivatives    Zofran [Ondansetron Hcl]     CURRENT MEDICATIONS:  Outpatient Encounter Medications as of 09/26/2023  Medication Sig   ALPRAZolam (XANAX) 0.25 MG tablet Take 1 tablet (0.25 mg total) by mouth 2 (two) times daily as needed for anxiety.   aspirin 81 MG tablet Take 81 mg by mouth daily.   brimonidine (ALPHAGAN P) 0.1 % SOLN    busPIRone (BUSPAR) 10 MG tablet Take 10 mg by mouth 2 (two) times daily.   Carbidopa-Levodopa ER (SINEMET CR) 25-100 MG tablet controlled release Take 2 tablets 3 times per day (she takes at 10am/2pm/6pm)   ferrous sulfate 325 (65 FE) MG tablet Take 325 mg by mouth daily with breakfast. Every other day   hydroxychloroquine (PLAQUENIL) 200 MG tablet TAKE 1 TABLET DAILY FOR SYSTEMIC LUPUS ERYTHEMATOSUS   levothyroxine (SYNTHROID, LEVOTHROID) 25 MCG tablet Take 25 mcg by mouth daily.   loratadine (CLARITIN) 10 MG tablet Take 10 mg by mouth daily.   losartan (COZAAR) 100 MG tablet Take 100 mg by mouth daily.   Omega-3 Fatty Acids (OMEGA 3 PO) Take by mouth daily.   pantoprazole (PROTONIX) 40 MG tablet Take 40 mg by mouth daily.     sertraline (ZOLOFT) 50 MG tablet Take 50 mg by mouth daily.   simvastatin (ZOCOR) 20 MG tablet Take 20 mg by mouth at bedtime.   triamcinolone cream (KENALOG) 0.1 % Apply topically as needed.   valsartan (DIOVAN) 320 MG tablet Take 320 mg by mouth daily.   vitamin B-12 (CYANOCOBALAMIN) 1000 MCG tablet Take 1,000 mcg by mouth daily.   Calcium Carbonate-Vitamin D 600-400 MG-UNIT tablet Take 1 tablet by mouth daily.   Coenzyme Q10 (COQ-10) 50 MG CAPS Take 1 capsule by mouth daily.   dicyclomine (BENTYL) 20 MG tablet Take 1 tablet (20 mg total) by mouth daily.   fluticasone (FLONASE) 50 MCG/ACT nasal spray as needed.   [DISCONTINUED] albuterol (VENTOLIN HFA) 108 (90 Base) MCG/ACT inhaler  (Patient not taking: Reported on 09/26/2023)   [DISCONTINUED] potassium chloride SA (KLOR-CON M) 20 MEQ tablet Take 20 mEq by mouth daily. (Patient not taking: Reported on 09/26/2023)   [DISCONTINUED] sertraline (ZOLOFT) 25 MG tablet Take 25 mg by mouth daily. (Patient not taking: Reported on 07/19/2023)   No facility-administered encounter medications on file as of 09/26/2023.    Objective:   PHYSICAL EXAMINATION:    VITALS:   Vitals:   09/26/23 1436  BP: 124/82  Pulse: 76  SpO2: 98%  Weight: 106 lb 9.6 oz (48.4 kg)  Height: 4\' 11"  (1.499 m)   Wt Readings from Last 3 Encounters:  09/26/23 106 lb 9.6 oz (48.4 kg)  07/19/23 103 lb (46.7 kg)  06/28/23 91 lb 3.2 oz (41.4 kg)     GEN:  The patient appears stated age and is in NAD. HEENT:  Normocephalic, atraumatic.  The mucous membranes are moist. The superficial temporal arteries are without ropiness or tenderness. CV:  RRR Lungs:  CTAB Neck/HEME:  There are no carotid bruits bilaterally.  Neurological examination:  Orientation: The patient is alert and oriented x3. Cranial nerves: There is good facial symmetry with min facial hypomimia. The speech is fluent and clear. Soft palate rises symmetrically and there is no tongue deviation. Hearing is  intact to conversational tone. Sensation: Sensation is intact to light touch throughout Motor: Strength is at least antigravity x4.  Movement examination: Tone: There is nl tone in the UE/LE Abnormal movements: she has mild dyskinesia in the LUE/LLE Coordination:  There is no decremation with any form of RAMS, including alternating supination and pronation of the forearm, hand opening and closing, finger taps, heel taps and toe taps.  Gait and Station: The patient has no difficulty arising out of a deep-seated chair without the use of the hands. The patient's stride length is slightly decreased and she is wide based but steady.    I have reviewed and interpreted the following labs independently    Chemistry      Component Value Date/Time   NA 137 06/28/2023 1129   NA 141 01/13/2021 1115   K 3.8 06/28/2023 1129   CL 106 06/28/2023 1129   CO2 20 06/28/2023 1129   BUN 42 (H) 06/28/2023 1129   BUN 16 01/13/2021 1115   CREATININE 0.97 06/28/2023 1129   CREATININE 1.03 (H) 01/01/2023 1337      Component Value Date/Time   CALCIUM 9.6 06/28/2023 1129   ALKPHOS 79 06/28/2023 1129   AST 14 06/28/2023 1129   ALT 3 06/28/2023 1129   BILITOT 0.4 06/28/2023 1129   BILITOT 0.4 01/13/2021 1115       Lab Results  Component Value Date   WBC 7.2 06/28/2023   HGB 11.0 (L) 06/28/2023   HCT 33.9 (L) 06/28/2023   MCV 87.6 06/28/2023   PLT 269.0 06/28/2023    Lab Results  Component Value Date   TSH 2.57 07/24/2018     Cc:  Cleatis Polka., MD

## 2023-09-26 ENCOUNTER — Encounter: Payer: Self-pay | Admitting: Neurology

## 2023-09-26 ENCOUNTER — Ambulatory Visit (INDEPENDENT_AMBULATORY_CARE_PROVIDER_SITE_OTHER): Payer: Medicare Other | Admitting: Neurology

## 2023-09-26 VITALS — BP 124/82 | HR 76 | Ht 59.0 in | Wt 106.6 lb

## 2023-09-26 DIAGNOSIS — G20B2 Parkinson's disease with dyskinesia, with fluctuations: Secondary | ICD-10-CM | POA: Diagnosis not present

## 2023-09-28 ENCOUNTER — Ambulatory Visit: Payer: Medicare Other | Admitting: Neurology

## 2023-10-11 ENCOUNTER — Other Ambulatory Visit: Payer: Self-pay | Admitting: Internal Medicine

## 2023-10-11 DIAGNOSIS — Z1231 Encounter for screening mammogram for malignant neoplasm of breast: Secondary | ICD-10-CM

## 2023-10-17 ENCOUNTER — Other Ambulatory Visit: Payer: Self-pay

## 2023-10-17 DIAGNOSIS — G20B2 Parkinson's disease with dyskinesia, with fluctuations: Secondary | ICD-10-CM

## 2023-10-17 MED ORDER — CARBIDOPA-LEVODOPA ER 25-100 MG PO TBCR: EXTENDED_RELEASE_TABLET | ORAL | 0 refills | Status: DC

## 2023-10-22 ENCOUNTER — Telehealth: Payer: Self-pay | Admitting: Neurology

## 2023-10-22 NOTE — Telephone Encounter (Signed)
 Patient confused medication was correct her bottle said CR instead of ER but asare the 25/100 2 tabs 3 times a day

## 2023-10-22 NOTE — Telephone Encounter (Signed)
 Pt calling with Issue with Rx just picked up from Pharmacy: wrong dosage and was to be sent to [facility] mailing services, would like a call back to speak abt dosage and pharmacy

## 2023-10-31 ENCOUNTER — Other Ambulatory Visit: Payer: Self-pay

## 2023-10-31 DIAGNOSIS — G20B2 Parkinson's disease with dyskinesia, with fluctuations: Secondary | ICD-10-CM

## 2023-10-31 MED ORDER — CARBIDOPA-LEVODOPA ER 25-100 MG PO TBCR: EXTENDED_RELEASE_TABLET | ORAL | 0 refills | Status: DC

## 2023-11-07 DIAGNOSIS — L290 Pruritus ani: Secondary | ICD-10-CM | POA: Diagnosis not present

## 2023-11-08 ENCOUNTER — Telehealth (HOSPITAL_BASED_OUTPATIENT_CLINIC_OR_DEPARTMENT_OTHER): Payer: Self-pay | Admitting: Obstetrics & Gynecology

## 2023-11-08 NOTE — Telephone Encounter (Signed)
 cCalled patient and left a message to call the office back to schedule the appointment .

## 2023-11-13 ENCOUNTER — Encounter (HOSPITAL_BASED_OUTPATIENT_CLINIC_OR_DEPARTMENT_OTHER): Payer: Self-pay | Admitting: Obstetrics & Gynecology

## 2023-11-13 ENCOUNTER — Ambulatory Visit (INDEPENDENT_AMBULATORY_CARE_PROVIDER_SITE_OTHER): Admitting: Obstetrics & Gynecology

## 2023-11-13 VITALS — BP 110/56 | HR 70 | Ht 59.0 in | Wt 106.6 lb

## 2023-11-13 DIAGNOSIS — L292 Pruritus vulvae: Secondary | ICD-10-CM

## 2023-11-13 DIAGNOSIS — L290 Pruritus ani: Secondary | ICD-10-CM | POA: Diagnosis not present

## 2023-11-13 NOTE — Progress Notes (Unsigned)
   New patient Patient name: Veronica Jordan MRN [id number]  Date of birth: [birth year] Chief Complaint:   New Patient (Initial Visit) C/o vulvar itching  History of Present Illness:   Veronica Jordan is a 78 y.o. [phone] {race:25618} female being seen today for a routine annual exam.  Current complaints:  Patient's last menstrual period was 07/03/1998 (approximate).   The pregnancy intention screening data noted above was reviewed. Potential methods of contraception were discussed. The patient elected to proceed with No data recorded.   Last pap  . Results were: {Pap findings:25134}. H/O abnormal pap: {yes/yes***/no:23866} Last mammogram: 11/22/2022. Results were: {normal, abnormal, n/a:23837}. Family h/o breast cancer: {yes***/no:23838} Last colonoscopy:02/24/2004 . Results were: {normal, abnormal, n/a:23837}. Family h/o colorectal cancer: {yes***/no:23838}      No data to display               No data to display           Review of Systems:   Pertinent items are noted in HPI Denies any headaches, blurred vision, fatigue, shortness of breath, chest pain, abdominal pain, abnormal vaginal discharge/itching/odor/irritation, problems with periods, bowel movements, urination, or intercourse unless otherwise stated above. Pertinent History Reviewed:  Reviewed past medical,surgical, social and family history.  Reviewed problem list, medications and allergies. Physical Assessment:   Vitals:   11/13/23 1328  Weight: 106 lb 9.6 oz (48.4 kg)  Height: 4\' 11"  (1.499 m)  Body mass index is 21.53 kg/m.        Physical Examination:   General appearance - well appearing, and in no distress  Mental status - alert, oriented to person, place, and time  Psych:  She has a normal mood and affect  Skin - warm and dry, normal color, no suspicious lesions noted  Chest - effort normal, all lung fields clear to auscultation bilaterally  Heart - normal rate and regular rhythm  Neck:   midline trachea, no thyromegaly or nodules  Breasts - breasts appear normal, no suspicious masses, no skin or nipple changes or  axillary nodes  Abdomen - soft, nontender, nondistended, no masses or organomegaly  Pelvic - VULVA: normal appearing vulva with no masses, tenderness or lesions  VAGINA: normal appearing vagina with normal color and discharge, no lesions  CERVIX: normal appearing cervix without discharge or lesions, no CMT  Thin prep pap is {Desc; done/not:10129} *** HR HPV cotesting  UTERUS: uterus is felt to be normal size, shape, consistency and nontender   ADNEXA: No adnexal masses or tenderness noted.  Rectal - normal rectal, good sphincter tone, no masses felt. Hemoccult: ***  Extremities:  No swelling or varicosities noted  Chaperone present for exam  No results found for this or any previous visit (from the past 24 hours).  Assessment & Plan:  1) Well-Woman Exam  2) ***  Labs/procedures today: ***  Mammogram: {Mammo f/u:25212::"@ 78yo"}, or sooner if problems Colonoscopy: {TCS f/u:25213::"@ 78yo"}, or sooner if problems  No orders of the defined types were placed in this encounter.   Meds: No orders of the defined types were placed in this encounter.   Follow-up: No follow-ups on file.  SHERIE  MURPHY, CMA 11/13/2023 1:30 PM

## 2023-11-13 NOTE — Patient Instructions (Signed)
 Aquaphor, apply morning and evening  Try to not use vagisil as can cause more itching  If the Aquaphor isn't chick enough, you can always use Desitin topically once or twice daily  If has significant itching episode, please let me know.

## 2023-11-16 ENCOUNTER — Encounter (HOSPITAL_BASED_OUTPATIENT_CLINIC_OR_DEPARTMENT_OTHER): Payer: Self-pay | Admitting: Obstetrics & Gynecology

## 2023-11-21 ENCOUNTER — Ambulatory Visit

## 2023-11-28 ENCOUNTER — Other Ambulatory Visit: Payer: Self-pay | Admitting: Physician Assistant

## 2023-11-28 DIAGNOSIS — M3219 Other organ or system involvement in systemic lupus erythematosus: Secondary | ICD-10-CM

## 2023-11-28 NOTE — Telephone Encounter (Signed)
 Last Fill: 07/11/2023  Eye exam: 06/12/2023 WNL   Labs: 06/28/2023 Glucose 129, BUN 42, GFR 56.99, Hgb 11.0, Hct 33.9, Eosinophils Relative 5.1  Next Visit: 12/17/2023  Last Visit: 07/19/2023  GN:FAOZH systemic lupus erythematosus with other organ involvement   Current Dose per office note 07/19/2023: Plaquenil  200 mg 1 tablet by mouth daily   Patient to update labs at upcoming appointment on 12/17/2023.   Okay to refill Plaquenil ?

## 2023-12-03 DIAGNOSIS — N1831 Chronic kidney disease, stage 3a: Secondary | ICD-10-CM | POA: Diagnosis not present

## 2023-12-03 DIAGNOSIS — I129 Hypertensive chronic kidney disease with stage 1 through stage 4 chronic kidney disease, or unspecified chronic kidney disease: Secondary | ICD-10-CM | POA: Diagnosis not present

## 2023-12-03 DIAGNOSIS — D649 Anemia, unspecified: Secondary | ICD-10-CM | POA: Diagnosis not present

## 2023-12-03 DIAGNOSIS — F411 Generalized anxiety disorder: Secondary | ICD-10-CM | POA: Diagnosis not present

## 2023-12-03 DIAGNOSIS — K838 Other specified diseases of biliary tract: Secondary | ICD-10-CM | POA: Diagnosis not present

## 2023-12-03 DIAGNOSIS — I4581 Long QT syndrome: Secondary | ICD-10-CM | POA: Diagnosis not present

## 2023-12-03 DIAGNOSIS — G20A1 Parkinson's disease without dyskinesia, without mention of fluctuations: Secondary | ICD-10-CM | POA: Diagnosis not present

## 2023-12-03 DIAGNOSIS — G629 Polyneuropathy, unspecified: Secondary | ICD-10-CM | POA: Diagnosis not present

## 2023-12-03 DIAGNOSIS — E1129 Type 2 diabetes mellitus with other diabetic kidney complication: Secondary | ICD-10-CM | POA: Diagnosis not present

## 2023-12-03 DIAGNOSIS — F339 Major depressive disorder, recurrent, unspecified: Secondary | ICD-10-CM | POA: Diagnosis not present

## 2023-12-03 DIAGNOSIS — M3210 Systemic lupus erythematosus, organ or system involvement unspecified: Secondary | ICD-10-CM | POA: Diagnosis not present

## 2023-12-03 DIAGNOSIS — M810 Age-related osteoporosis without current pathological fracture: Secondary | ICD-10-CM | POA: Diagnosis not present

## 2023-12-04 ENCOUNTER — Ambulatory Visit
Admission: RE | Admit: 2023-12-04 | Discharge: 2023-12-04 | Disposition: A | Discharge status: Home / Self Care | Source: Ambulatory Visit | Attending: Internal Medicine | Admitting: Internal Medicine

## 2023-12-04 DIAGNOSIS — Z1231 Encounter for screening mammogram for malignant neoplasm of breast: Secondary | ICD-10-CM

## 2023-12-04 NOTE — Progress Notes (Unsigned)
 Office Visit Note  Patient: Veronica Jordan             Date of Birth: [birth year]           MRN: [id number]             PCP: Jeannine Milroy., MD Referring: Jeannine Milroy., MD Visit Date: 12/17/2023 Occupation: @GUAROCC @  Subjective:  Medication monitoring  History of Present Illness: Veronica Jordan is a 78 y.o. female with history of systemic lupus and osteoarthritis.  Patient remains on plaquenil  200 mg 1 tablet by mouth daily.  She tolerating Plaquenil  without any side effects and has not had any recent gaps in therapy.  She denies any signs or symptoms of a systemic lupus flare.  Patient continues to have chronic sicca symptoms especially dry mouth first thing in the mornings.  She denies any oral or nasal ulcerations.  She denies any swollen lymph nodes or parotid swelling.  Patient continues to have intermittent myalgias and fatigue.  She denies any increased joint swelling.  She denies any recent rashes or hair loss.     Activities of Daily Living:  Patient reports morning stiffness for 20-30 minutes.   Patient Reports nocturnal pain.  Difficulty dressing/grooming: Denies Difficulty climbing stairs: Reports Difficulty getting out of chair: Denies Difficulty using hands for taps, buttons, cutlery, and/or writing: Denies  Review of Systems  Constitutional:  Positive for fatigue.  HENT:  Negative for mouth sores and mouth dryness.   Eyes:  Positive for dryness.  Cardiovascular:  Positive for palpitations. Negative for chest pain.  Gastrointestinal:  Negative for blood in stool, constipation and diarrhea.  Endocrine: Positive for increased urination.  Genitourinary:  Negative for involuntary urination.  Musculoskeletal:  Positive for joint pain, joint pain, myalgias, morning stiffness, muscle tenderness and myalgias. Negative for gait problem, joint swelling and muscle weakness.  Skin:  Negative for color change, rash, hair loss and sensitivity to sunlight.   Allergic/Immunologic: Positive for susceptible to infections.  Neurological:  Positive for dizziness. Negative for headaches.  Hematological:  Negative for swollen glands.  Psychiatric/Behavioral:  Negative for depressed mood and sleep disturbance. The patient is not nervous/anxious.     PMFS History:  Patient Active Problem List   Diagnosis Date Noted   Sjogren's syndrome with keratoconjunctivitis sicca (HCC) 08/07/2018   History of nephritis 08/07/2018   History of optic neuritis 08/07/2018   Fibromyalgia 08/07/2018   Primary osteoarthritis of both hands 08/07/2018   Primary osteoarthritis of both knees 08/07/2018   DDD (degenerative disc disease), lumbar 08/07/2018   Eosinophilic esophagitis 08/07/2018   History of IBS 08/07/2018   History of gastroesophageal reflux (GERD) 08/07/2018   History of diverticulosis 08/07/2018   History of type 2 diabetes mellitus 08/07/2018   History of kidney stones 08/07/2018   History of peripheral neuropathy 08/07/2018   Dyslipidemia 08/07/2018   Anxiety and depression 08/07/2018   Essential tremor 08/07/2018   Postmenopausal hormone replacement therapy 03/05/2015   Osteoporosis 03/05/2015   Atrophic vaginitis 03/05/2015   HYPERTHYROIDISM 12/13/2007   Hypercholesterolemia 12/13/2007   Essential hypertension 12/13/2007   ALLERGIC RHINITIS 12/13/2007   LUPUS ERYTHEMATOSUS 12/13/2007   DYSPNEA 12/13/2007    Past Medical History:  Diagnosis Date   Allergic rhinitis    Anemia    Anxiety and depression    Depression    Diabetes mellitus    Diverticulitis    Fibromyalgia    GERD (gastroesophageal reflux disease)    History of  gallstones    History of kidney stones    Hyperlipemia    Hypertension    Hypothyroidism    IBS (irritable bowel syndrome)    Osteoarthritis    Osteoporosis    Renal calculi    Sjogren's syndrome (HCC)    Systemic lupus (HCC) 2001    Family History  Problem Relation Age of Onset   Asthma Mother    COPD  Mother    Heart disease Father    Asthma Sister    Hypertension Sister    Hypertension Brother    Heart attack Brother    Arthritis Maternal Grandmother    Heart disease Maternal Grandfather    Lupus Paternal Grandmother    Heart attack Paternal Grandfather    Chronic fatigue Daughter    Parkinsonism Other        MGM   Colon cancer Neg Hx    Esophageal cancer Neg Hx    Stomach cancer Neg Hx    Past Surgical History:  Procedure Laterality Date   APPENDECTOMY  1970   BREAST BIOPSY Left 08/2020   BREAST BIOPSY Left 01/26/2023   MM LT BREAST BX W LOC DEV 1ST LESION IMAGE BX SPEC STEREO GUIDE 01/26/2023 [facility]   BREAST EXCISIONAL BIOPSY Left    CATARACT EXTRACTION     bilateral   CESAREAN SECTION  1976, 1977   x2   CHOLECYSTECTOMY  1988   TONSILLECTOMY AND ADENOIDECTOMY     TOTAL ABDOMINAL HYSTERECTOMY  2000   BSO due to fibroids   Social History   Social History Narrative   Youngest daughter died in MVA.   Right handed    Lives in a two story home that has a basement   Immunization History  Administered Date(s) Administered   PFIZER(Purple Top)SARS-COV-2 Vaccination 08/29/2019, 09/26/2019     Objective: Vital Signs: BP 108/64 (BP Location: Left Arm, Patient Position: Sitting, Cuff Size: Normal)   Pulse 64   Ht 4' 11 (1.499 m)   Wt 110 lb (49.9 kg)   LMP 07/03/1998 (Approximate)   BMI 22.22 kg/m    Physical Exam Vitals and nursing note reviewed.  Constitutional:      Appearance: She is well-developed.  HENT:     Head: Normocephalic and atraumatic.   Eyes:     Conjunctiva/sclera: Conjunctivae normal.    Cardiovascular:     Rate and Rhythm: Normal rate and regular rhythm.     Heart sounds: Normal heart sounds.  Pulmonary:     Effort: Pulmonary effort is normal.     Breath sounds: Normal breath sounds.  Abdominal:     General: Bowel sounds are normal.     Palpations: Abdomen is soft.   Musculoskeletal:     Cervical back: Normal range of  motion.  Lymphadenopathy:     Cervical: No cervical adenopathy.   Skin:    General: Skin is warm and dry.     Capillary Refill: Capillary refill takes less than 2 seconds.   Neurological:     Mental Status: She is alert and oriented to person, place, and time.   Psychiatric:        Behavior: Behavior normal.      Musculoskeletal Exam: Generalized hyperalgesia and positive tender points on exam.  C-spine has limited range of motion without rotation especially to the left.  Trapezius muscle tension tenderness bilaterally.  Limited mobility of the lumbar spine.  Shoulder joints, elbow joints, wrist joints, MCPs, PIPs, DIPs have good range of motion  with no synovitis.  PIP and DIP thickening consistent with osteoarthritis of both hands.  Hip joints have good range of motion with no groin pain.  Knee joints have good range of motion with no warmth or effusion.  Ankle joints have good range of motion with no tenderness or joint swelling.  CDAI Exam: CDAI Score: -- Patient Global: --; Provider Global: -- Swollen: --; Tender: -- Joint Exam 12/17/2023   No joint exam has been documented for this visit   There is currently no information documented on the homunculus. Go to the Rheumatology activity and complete the homunculus joint exam.  Investigation: No additional findings.  Imaging: MM 3D SCREENING MAMMOGRAM BILATERAL BREAST Result Date: 12/10/2023 CLINICAL DATA:  Screening. EXAM: DIGITAL SCREENING BILATERAL MAMMOGRAM WITH TOMOSYNTHESIS AND CAD TECHNIQUE: Bilateral screening digital craniocaudal and mediolateral oblique mammograms were obtained. Bilateral screening digital breast tomosynthesis was performed. The images were evaluated with computer-aided detection. COMPARISON:  Previous exam(s). ACR Breast Density Category b: There are scattered areas of fibroglandular density. FINDINGS: There are no findings suspicious for malignancy. IMPRESSION: No mammographic evidence of malignancy. A  result letter of this screening mammogram will be mailed directly to the patient. RECOMMENDATION: Screening mammogram in one year. (Code:SM-B-01Y) BI-RADS CATEGORY  1: Negative. Electronically Signed   By: Dina  Arceo M.D.   On: 12/10/2023 08:40    Recent Labs: Lab Results  Component Value Date   WBC 7.2 06/28/2023   HGB 11.0 (L) 06/28/2023   PLT 269.0 06/28/2023   NA 137 06/28/2023   K 3.8 06/28/2023   CL 106 06/28/2023   CO2 20 06/28/2023   GLUCOSE 129 (H) 06/28/2023   BUN 42 (H) 06/28/2023   CREATININE 0.97 06/28/2023   BILITOT 0.4 06/28/2023   ALKPHOS 79 06/28/2023   AST 14 06/28/2023   ALT 3 06/28/2023   PROT 7.2 07/19/2023   ALBUMIN 4.2 06/28/2023   CALCIUM 9.6 06/28/2023   GFRAA 58 (L) 08/06/2020    Speciality Comments: PLQ Eye Exam: 06/12/2023 WNL @ [facility] Follow up in 4-6 months.  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  She has clinically been doing well taking Plaquenil  200 mg 1 tablet by mouth daily.  She is tolerating Plaquenil  without any side effects and has not had any recent gaps in therapy.  She has no synovitis on examination today.  Overall her energy level has been stable.  She has not had any recent rashes and has no signs of alopecia.  There are several lymphadenopathy or parotid swelling noted.  She continues to have chronic sicca symptoms especially symptoms of dry mouth.  Discussed the use of over-the-counter products for symptomatic relief.  No  oral or nasal ulcerations.  No symptoms of Raynaud's phenomenon.  Capillary refill less than 2 seconds. Lab work from 07/19/23 reviewed today in the office: dsDNA 9, complements WNL, ESR WNL, SPEP normal.  Plan to update the following lab work today.  She will remain on Plaquenil  as prescribed.  She was advised to notify us  if she develops signs or symptoms of a flare.  She will follow-up in the office in 5 months or sooner if needed. - Plan: Protein / creatinine ratio, urine, CBC with Differential/Platelet, C3 and C4, Sedimentation rate, Comprehensive metabolic panel with GFR, ANA, Anti-DNA antibody, double-stranded  High risk medication use - Plaquenil  200 mg 1 tablet by mouth daily.  PLQ Eye Exam: 06/12/2023 WNL @ [facility] Follow up in 4-6 months.  CBC and CMP updated on 06/28/23. Orders for CBC and CMP released today. - Plan: CBC with Differential/Platelet, Comprehensive metabolic panel with GFR  History of optic neuritis - Followed by Dr. Daneil Dunker with [facility].  History of nephritis - Followed by nephrology: No proteinuria noted on 07/19/2023.  Urine protein creatinine ratio updated today.  Sjogren's syndrome with keratoconjunctivitis sicca (HCC) - ANA+, Ro+, La+, positive RF: She continues to have chronic sicca symptoms especially dry mouth.  Discussed the use of over-the-counter products including XyliMelts, smart mouth, and Biotene products.  Also discussed the use of a humidifier in her bedroom.   No cervical lymphadenopathy was noted.  No parotid swelling or tenderness noted. She remains on Plaquenil  200 mg 1 tablet by mouth daily.  She is tolerating Plaquenil  without any side effects and has not had any recent gaps in therapy. Discussed the increased risk for developing lymphoma in patients with Sjogren's syndrome. Plan to update the following lab work today for further evaluation. - Plan: Serum protein electrophoresis with reflex  Primary osteoarthritis of both hands:  She has PIP and DIP thickening consistent with osteoarthritis of both hands.  No synovitis noted.  Complete fist formation bilaterally.  Primary osteoarthritis of both knees: She has good range of motion of both knee joints on examination today.  No warmth or effusion noted.  Degeneration of intervertebral disc of lumbar region without discogenic back pain or lower extremity pain: She is not experiencing any symptoms of radiculopathy at this time.  Age-related osteoporosis without current pathological fracture - DEXA ordered by PCP. DEXA normal on 05/26/14--DEXA in epic to review.  Patient is taking calcium and vitamin D  supplement daily.  Vitamin D  deficiency: She is taking a calcium and vitamin D  supplement daily.   Fibromyalgia: Patient has generalized myalgias and muscle tenderness due to fibromyalgia. Discussed the importance of regular exercise and good sleep hygiene.   Other medical conditions are listed as follows:   Dyslipidemia  History of IBS  Anxiety and depression  History of type 2 diabetes mellitus  Eosinophilic esophagitis  History of kidney stones  Essential hypertension: BP was 108/64 today in the office.   History of gastroesophageal reflux (GERD)  History of diverticulosis  History of Parkinson's disease  Orders: Orders Placed This Encounter  Procedures   Protein / creatinine ratio, urine   CBC with Differential/Platelet   C3 and C4   Sedimentation rate   Comprehensive metabolic panel with GFR   ANA   Anti-DNA antibody, double-stranded   Serum protein electrophoresis with reflex   No orders of the defined types were placed in this encounter.   Follow-Up Instructions: Return in about 5 months (around 05/18/2024) for Systemic lupus erythematosus, Osteoarthritis.   Romayne Clubs, PA-C  Note - This record has been created using Dragon software.  Chart creation errors have been sought, but may not always  have been located. Such creation errors  do not reflect on  the standard of medical care.

## 2023-12-17 ENCOUNTER — Encounter: Payer: Self-pay | Admitting: Physician Assistant

## 2023-12-17 ENCOUNTER — Ambulatory Visit: Payer: Medicare Other | Attending: Physician Assistant | Admitting: Physician Assistant

## 2023-12-17 VITALS — BP 108/64 | HR 64 | Ht 59.0 in | Wt 110.0 lb

## 2023-12-17 DIAGNOSIS — E559 Vitamin D deficiency, unspecified: Secondary | ICD-10-CM | POA: Diagnosis not present

## 2023-12-17 DIAGNOSIS — M19042 Primary osteoarthritis, left hand: Secondary | ICD-10-CM | POA: Insufficient documentation

## 2023-12-17 DIAGNOSIS — Z79899 Other long term (current) drug therapy: Secondary | ICD-10-CM | POA: Diagnosis not present

## 2023-12-17 DIAGNOSIS — M810 Age-related osteoporosis without current pathological fracture: Secondary | ICD-10-CM | POA: Diagnosis not present

## 2023-12-17 DIAGNOSIS — Z87442 Personal history of urinary calculi: Secondary | ICD-10-CM | POA: Insufficient documentation

## 2023-12-17 DIAGNOSIS — M170 Bilateral primary osteoarthritis of knee: Secondary | ICD-10-CM | POA: Insufficient documentation

## 2023-12-17 DIAGNOSIS — Z87448 Personal history of other diseases of urinary system: Secondary | ICD-10-CM | POA: Diagnosis not present

## 2023-12-17 DIAGNOSIS — Z8639 Personal history of other endocrine, nutritional and metabolic disease: Secondary | ICD-10-CM | POA: Diagnosis not present

## 2023-12-17 DIAGNOSIS — K200 Eosinophilic esophagitis: Secondary | ICD-10-CM | POA: Insufficient documentation

## 2023-12-17 DIAGNOSIS — M19041 Primary osteoarthritis, right hand: Secondary | ICD-10-CM | POA: Insufficient documentation

## 2023-12-17 DIAGNOSIS — M797 Fibromyalgia: Secondary | ICD-10-CM | POA: Diagnosis not present

## 2023-12-17 DIAGNOSIS — F419 Anxiety disorder, unspecified: Secondary | ICD-10-CM | POA: Diagnosis not present

## 2023-12-17 DIAGNOSIS — E785 Hyperlipidemia, unspecified: Secondary | ICD-10-CM | POA: Diagnosis not present

## 2023-12-17 DIAGNOSIS — M3219 Other organ or system involvement in systemic lupus erythematosus: Secondary | ICD-10-CM | POA: Diagnosis not present

## 2023-12-17 DIAGNOSIS — M51369 Other intervertebral disc degeneration, lumbar region without mention of lumbar back pain or lower extremity pain: Secondary | ICD-10-CM | POA: Insufficient documentation

## 2023-12-17 DIAGNOSIS — Z8719 Personal history of other diseases of the digestive system: Secondary | ICD-10-CM | POA: Insufficient documentation

## 2023-12-17 DIAGNOSIS — M3501 Sicca syndrome with keratoconjunctivitis: Secondary | ICD-10-CM | POA: Insufficient documentation

## 2023-12-17 DIAGNOSIS — I10 Essential (primary) hypertension: Secondary | ICD-10-CM | POA: Insufficient documentation

## 2023-12-17 DIAGNOSIS — Z8669 Personal history of other diseases of the nervous system and sense organs: Secondary | ICD-10-CM | POA: Insufficient documentation

## 2023-12-17 DIAGNOSIS — F32A Depression, unspecified: Secondary | ICD-10-CM | POA: Insufficient documentation

## 2023-12-18 ENCOUNTER — Ambulatory Visit: Payer: Self-pay | Admitting: Physician Assistant

## 2023-12-18 NOTE — Progress Notes (Signed)
 CBC WNL Glucose is 130. Rest of CMP stable.  ESR WNL Complements WNL

## 2023-12-18 NOTE — Progress Notes (Signed)
 Urine protein creatinine ratio WNL

## 2023-12-19 LAB — ANTI-NUCLEAR AB-TITER (ANA TITER): ANA Titer 1: 1:1280 {titer} — ABNORMAL HIGH

## 2023-12-19 LAB — COMPREHENSIVE METABOLIC PANEL WITH GFR
AG Ratio: 1.6 (calc) (ref 1.0–2.5)
ALT: 26 U/L (ref 6–29)
AST: 20 U/L (ref 10–35)
Albumin: 4.4 g/dL (ref 3.6–5.1)
Alkaline phosphatase (APISO): 90 U/L (ref 37–153)
BUN/Creatinine Ratio: 58 (calc) — ABNORMAL HIGH (ref 6–22)
BUN: 41 mg/dL — ABNORMAL HIGH (ref 7–25)
CO2: 20 mmol/L (ref 20–32)
Calcium: 9.5 mg/dL (ref 8.6–10.4)
Chloride: 108 mmol/L (ref 98–110)
Creat: 0.71 mg/dL (ref 0.60–1.00)
Globulin: 2.7 g/dL (ref 1.9–3.7)
Glucose, Bld: 130 mg/dL — ABNORMAL HIGH (ref 65–99)
Potassium: 4.2 mmol/L (ref 3.5–5.3)
Sodium: 137 mmol/L (ref 135–146)
Total Bilirubin: 0.3 mg/dL (ref 0.2–1.2)
Total Protein: 7.1 g/dL (ref 6.1–8.1)
eGFR: 88 mL/min/{1.73_m2} (ref >=60)

## 2023-12-19 LAB — CBC WITH DIFFERENTIAL/PLATELET
Absolute Lymphocytes: 1187 {cells}/uL (ref 850–3900)
Absolute Monocytes: 642 {cells}/uL (ref 200–950)
Basophils Absolute: 69 {cells}/uL (ref 0–200)
Basophils Relative: 1 %
Eosinophils Absolute: 179 {cells}/uL (ref 15–500)
Eosinophils Relative: 2.6 %
HCT: 38.1 % (ref 35.0–45.0)
Hemoglobin: 12 g/dL (ref 11.7–15.5)
MCH: 29.8 pg (ref 27.0–33.0)
MCHC: 31.5 g/dL — ABNORMAL LOW (ref 32.0–36.0)
MCV: 94.5 fL (ref 80.0–100.0)
MPV: 10.3 fL (ref 7.5–12.5)
Monocytes Relative: 9.3 %
Neutro Abs: 4823 {cells}/uL (ref 1500–7800)
Neutrophils Relative %: 69.9 %
Platelets: 236 10*3/uL (ref 140–400)
RBC: 4.03 10*6/uL (ref 3.80–5.10)
RDW: 12.2 % (ref 11.0–15.0)
Total Lymphocyte: 17.2 %
WBC: 6.9 10*3/uL (ref 3.8–10.8)

## 2023-12-19 LAB — PROTEIN ELECTROPHORESIS, SERUM, WITH REFLEX
Albumin ELP: 4.3 g/dL (ref 3.8–4.8)
Alpha 1: 0.3 g/dL (ref 0.2–0.3)
Alpha 2: 0.8 g/dL (ref 0.5–0.9)
Beta 2: 0.3 g/dL (ref 0.2–0.5)
Beta Globulin: 0.4 g/dL (ref 0.4–0.6)
Gamma Globulin: 1.1 g/dL (ref 0.8–1.7)
Total Protein: 7.2 g/dL (ref 6.1–8.1)

## 2023-12-19 LAB — C3 AND C4
C3 Complement: 143 mg/dL (ref 83–193)
C4 Complement: 15 mg/dL (ref 15–57)

## 2023-12-19 LAB — PROTEIN / CREATININE RATIO, URINE
Creatinine, Urine: 87 mg/dL (ref 20–275)
Protein/Creat Ratio: 138 mg/g{creat} (ref 24–184)
Protein/Creatinine Ratio: 0.138 mg/mg{creat} (ref 0.024–0.184)
Total Protein, Urine: 12 mg/dL (ref 5–24)

## 2023-12-19 LAB — ANTI-DNA ANTIBODY, DOUBLE-STRANDED: ds DNA Ab: 16 [IU]/mL — ABNORMAL HIGH

## 2023-12-19 LAB — SEDIMENTATION RATE: Sed Rate: 14 mm/h (ref 0–30)

## 2023-12-19 LAB — ANA: Anti Nuclear Antibody (ANA): POSITIVE — AB

## 2023-12-19 NOTE — Progress Notes (Signed)
 dsDNA remains positive-16. We will continue to monitor lab work closely.  ANA and SPEP pending.

## 2023-12-20 NOTE — Progress Notes (Signed)
 ANA remains positive--titer and pattern unchanged.  SPEP normal.

## 2023-12-26 DIAGNOSIS — H40023 Open angle with borderline findings, high risk, bilateral: Secondary | ICD-10-CM | POA: Diagnosis not present

## 2023-12-26 DIAGNOSIS — Z79899 Other long term (current) drug therapy: Secondary | ICD-10-CM | POA: Diagnosis not present

## 2023-12-26 DIAGNOSIS — Z961 Presence of intraocular lens: Secondary | ICD-10-CM | POA: Diagnosis not present

## 2023-12-26 DIAGNOSIS — H53452 Other localized visual field defect, left eye: Secondary | ICD-10-CM | POA: Diagnosis not present

## 2024-01-11 ENCOUNTER — Other Ambulatory Visit: Payer: Self-pay | Admitting: Neurology

## 2024-01-11 DIAGNOSIS — G20B2 Parkinson's disease with dyskinesia, with fluctuations: Secondary | ICD-10-CM

## 2024-01-17 DIAGNOSIS — N181 Chronic kidney disease, stage 1: Secondary | ICD-10-CM | POA: Diagnosis not present

## 2024-01-17 DIAGNOSIS — G20B2 Parkinson's disease with dyskinesia, with fluctuations: Secondary | ICD-10-CM | POA: Diagnosis not present

## 2024-01-17 DIAGNOSIS — D631 Anemia in chronic kidney disease: Secondary | ICD-10-CM | POA: Diagnosis not present

## 2024-01-17 DIAGNOSIS — R809 Proteinuria, unspecified: Secondary | ICD-10-CM | POA: Diagnosis not present

## 2024-01-17 DIAGNOSIS — I129 Hypertensive chronic kidney disease with stage 1 through stage 4 chronic kidney disease, or unspecified chronic kidney disease: Secondary | ICD-10-CM | POA: Diagnosis not present

## 2024-01-17 DIAGNOSIS — E876 Hypokalemia: Secondary | ICD-10-CM | POA: Diagnosis not present

## 2024-01-17 DIAGNOSIS — E1122 Type 2 diabetes mellitus with diabetic chronic kidney disease: Secondary | ICD-10-CM | POA: Diagnosis not present

## 2024-01-17 DIAGNOSIS — M329 Systemic lupus erythematosus, unspecified: Secondary | ICD-10-CM | POA: Diagnosis not present

## 2024-01-28 ENCOUNTER — Other Ambulatory Visit: Payer: Self-pay | Admitting: Physician Assistant

## 2024-02-28 ENCOUNTER — Other Ambulatory Visit: Payer: Self-pay | Admitting: Neurology

## 2024-02-28 DIAGNOSIS — G20B2 Parkinson's disease with dyskinesia, with fluctuations: Secondary | ICD-10-CM

## 2024-03-06 ENCOUNTER — Other Ambulatory Visit: Payer: Self-pay | Admitting: Physician Assistant

## 2024-03-06 DIAGNOSIS — M3219 Other organ or system involvement in systemic lupus erythematosus: Secondary | ICD-10-CM

## 2024-03-06 NOTE — Telephone Encounter (Signed)
 Last Fill: 11/28/2023  Eye exam: 06/12/2023 WNL    Labs: 12/17/2023 CBC WNL Glucose is 130. Rest of CMP stable.   Next Visit: 05/20/2024  Last Visit: 12/17/2023  IK:Nuyzm systemic lupus erythematosus with other organ involvement   Current Dose per office note 12/17/2023: Plaquenil  200 mg 1 tablet by mouth daily.   Okay to refill Plaquenil ?

## 2024-04-15 ENCOUNTER — Other Ambulatory Visit: Payer: Self-pay | Admitting: Neurology

## 2024-04-15 DIAGNOSIS — G20B2 Parkinson's disease with dyskinesia, with fluctuations: Secondary | ICD-10-CM

## 2024-04-21 NOTE — Progress Notes (Unsigned)
 Assessment/Plan:   1.  Parkinsons Disease with family history of PD             -does not want genetic testing             -continue carbidopa /levodopa  25/100 CR, 2 tablet 3 times per day.  She is currently taking last one at bed and discussed to move them to 4 hour intervals (10am/2pm/6pm)             -Sounds like she is having some mild dyskinesia.  Discussed nature and pathophysiology.  We ultimately decided that it would not be in her best interest to add more medication right now.   2.  Depression, Anxiety and Panic Attacks             -Following with primary care             -Has had prolonged QT due to possibly Lexapro and it is now d/c             -psychiatry in hospital started the patient on BuSpar, 10 mg twice per day and low-dose sertraline, 25 mg daily.             -Saw psychiatry in the hospital in September, 2024 and it was recommended that she follow closely with psychiatry as an outpatient.  She was given names, but has not called the names yet for a follow-up appointment.  I encouraged her to do that.   3.  Dysphagia             -MBE on October 24, 2019 was normal, but speech therapist noted that solids remained in the mid to distal esophagus for prolonged period and GI referral was recommended.  Patient declined.  She feels she has been stable since that time.   4.  Sjogren's, lupus, fibromyalgia             -Follows with Dr. Dolphus   5.  RBD             -Doing fairly well in that regard.   6.  IBS-mixed, lifelong             -she asked if associated with Parkinsons Disease.  Discussed constipation is associated with Parkinsons Disease but not the IBS.  She has had lifelong issues and its getting worse.  She is f/u with [facility]  -[facility] noted that N/V is likely associated with anxiety.   7.  Weight loss             -happy to report she has gained 15 lbs since our last visit   Subjective:   Veronica Jordan was seen today in  follow up for Parkinsons disease.  My previous records were reviewed prior to todays visit as well as outside records available to me.  Patient is taking levodopa  3 times per day.  Pt denies falls.  Pt denies lightheadedness, near syncope.  No hallucinations.  Mood has been good.   Current prescribed movement disorder medications: Continue carbidopa /levodopa  25/100 CR, 2 tablet 3 times daily    ALLERGIES:   Allergies  Allergen Reactions   Clindamycin/Lincomycin     hives   Codeine Itching   Demerol [Meperidine] Nausea And Vomiting   Erythromycin    Iodinated Contrast Media    Meperidine Hcl    Ondansetron Hcl    Penicillins    Sulfonamide Derivatives    Zofran [Ondansetron Hcl]     CURRENT MEDICATIONS:  Outpatient Encounter Medications as of 04/22/2024  Medication Sig   ALPRAZolam  (XANAX ) 0.25 MG tablet Take 1 tablet (0.25 mg total) by mouth 2 (two) times daily as needed for anxiety.   amLODipine (NORVASC) 5 MG tablet 1 tablet Oral Once a day for 90 days   aspirin 81 MG tablet Take 81 mg by mouth daily.   brimonidine (ALPHAGAN P) 0.1 % SOLN    busPIRone (BUSPAR) 10 MG tablet Take 10 mg by mouth 2 (two) times daily.   Calcium Carbonate-Vitamin D  600-400 MG-UNIT tablet Take 1 tablet by mouth daily.   Carbidopa -Levodopa  ER (SINEMET  CR) 25-100 MG tablet controlled release TAKE 2 TABLETS BY MOUTH THREE TIMES DAILY AT 10 AM, 2 PM AND 6 PM   Coenzyme Q10 (COQ-10) 50 MG CAPS Take 1 capsule by mouth daily.   dicyclomine  (BENTYL ) 20 MG tablet TAKE 1 TABLET DAILY   ferrous sulfate 325 (65 FE) MG tablet Take 325 mg by mouth daily with breakfast. Every other day   fluticasone (FLONASE) 50 MCG/ACT nasal spray as needed.   hydroxychloroquine  (PLAQUENIL ) 200 MG tablet TAKE 1 TABLET DAILY FOR SYSTEMIC LUPUS ERYTHEMATOSUS   levothyroxine (SYNTHROID, LEVOTHROID) 25 MCG tablet Take 25 mcg by mouth daily.   loratadine (CLARITIN) 10 MG tablet Take 10 mg by mouth daily.   losartan (COZAAR) 100 MG  tablet Take 100 mg by mouth daily. (Patient not taking: Reported on 12/17/2023)   Omega-3 Fatty Acids (OMEGA 3 PO) Take by mouth daily. (Patient not taking: Reported on 12/17/2023)   pantoprazole (PROTONIX) 40 MG tablet Take 40 mg by mouth daily.    sertraline (ZOLOFT) 50 MG tablet Take 50 mg by mouth daily.   simvastatin (ZOCOR) 20 MG tablet Take 20 mg by mouth at bedtime.   triamcinolone cream (KENALOG) 0.1 % Apply topically as needed.   valsartan (DIOVAN) 320 MG tablet Take 320 mg by mouth daily.   vitamin B-12 (CYANOCOBALAMIN ) 1000 MCG tablet Take 1,000 mcg by mouth daily.   No facility-administered encounter medications on file as of 04/22/2024.    Objective:   PHYSICAL EXAMINATION:    VITALS:   There were no vitals filed for this visit.  Wt Readings from Last 3 Encounters:  12/17/23 110 lb (49.9 kg)  11/13/23 106 lb 9.6 oz (48.4 kg)  09/26/23 106 lb 9.6 oz (48.4 kg)     GEN:  The patient appears stated age and is in NAD. HEENT:  Normocephalic, atraumatic.  The mucous membranes are moist. The superficial temporal arteries are without ropiness or tenderness. CV:  RRR Lungs:  CTAB Neck/HEME:  There are no carotid bruits bilaterally.  Neurological examination:  Orientation: The patient is alert and oriented x3. Cranial nerves: There is good facial symmetry with min facial hypomimia. The speech is fluent and clear. Soft palate rises symmetrically and there is no tongue deviation. Hearing is intact to conversational tone. Sensation: Sensation is intact to light touch throughout Motor: Strength is at least antigravity x4.  Movement examination: Tone: There is nl tone in the UE/LE Abnormal movements: she has mild dyskinesia in the LUE/LLE Coordination:  There is no decremation with any form of RAMS, including alternating supination and pronation of the forearm, hand opening and closing, finger taps, heel taps and toe taps.  Gait and Station: The patient has no difficulty arising  out of a deep-seated chair without the use of the hands. The patient's stride length is slightly decreased and she is wide based but steady.    I  have reviewed and interpreted the following labs independently    Chemistry      Component Value Date/Time   NA 137 12/17/2023 1447   NA 141 01/13/2021 1115   K 4.2 12/17/2023 1447   CL 108 12/17/2023 1447   CO2 20 12/17/2023 1447   BUN 41 (H) 12/17/2023 1447   BUN 16 01/13/2021 1115   CREATININE 0.71 12/17/2023 1447      Component Value Date/Time   CALCIUM 9.5 12/17/2023 1447   ALKPHOS 79 06/28/2023 1129   AST 20 12/17/2023 1447   ALT 26 12/17/2023 1447   BILITOT 0.3 12/17/2023 1447   BILITOT 0.4 01/13/2021 1115       Lab Results  Component Value Date   WBC 6.9 12/17/2023   HGB 12.0 12/17/2023   HCT 38.1 12/17/2023   MCV 94.5 12/17/2023   PLT 236 12/17/2023    Lab Results  Component Value Date   TSH 2.57 07/24/2018   Total time spent on today's visit was *** minutes, including both face-to-face time and nonface-to-face time.  Time included that spent on review of records (prior notes available to me/labs/imaging if pertinent), discussing treatment and goals, answering patient's questions and coordinating care.   Cc:  Loreli Elsie JONETTA Mickey., MD

## 2024-04-22 ENCOUNTER — Encounter: Payer: Self-pay | Admitting: Neurology

## 2024-04-22 ENCOUNTER — Ambulatory Visit (INDEPENDENT_AMBULATORY_CARE_PROVIDER_SITE_OTHER): Admitting: Neurology

## 2024-04-22 VITALS — BP 100/60 | HR 64 | Ht 59.0 in | Wt 112.8 lb

## 2024-04-22 DIAGNOSIS — K582 Mixed irritable bowel syndrome: Secondary | ICD-10-CM | POA: Diagnosis not present

## 2024-04-22 DIAGNOSIS — G20B2 Parkinson's disease with dyskinesia, with fluctuations: Secondary | ICD-10-CM | POA: Diagnosis not present

## 2024-04-22 DIAGNOSIS — R42 Dizziness and giddiness: Secondary | ICD-10-CM

## 2024-04-22 MED ORDER — CARBIDOPA-LEVODOPA ER 25-100 MG PO TBCR: EXTENDED_RELEASE_TABLET | ORAL | 1 refills | Status: AC

## 2024-04-22 NOTE — Patient Instructions (Addendum)
 Increase carbidopa /levodopa  25/100 CR, 2 tablet 4 times per day at 10am/2pm/6pm/10pm  Ask Dr. Loreli about the dizziness and its potential relationship to the amlodipine  The physicians and staff at [facility] are committed to providing excellent care. You may receive a survey requesting feedback about your experience at our office. We strive to receive very good responses to the survey questions. If you feel that your experience would prevent you from giving the office a very good  response, please contact our office to try to remedy the situation. We may be reached at [phone]. Thank you for taking the time out of your busy day to complete the survey.

## 2024-05-02 DIAGNOSIS — E785 Hyperlipidemia, unspecified: Secondary | ICD-10-CM | POA: Diagnosis not present

## 2024-05-02 DIAGNOSIS — D649 Anemia, unspecified: Secondary | ICD-10-CM | POA: Diagnosis not present

## 2024-05-02 DIAGNOSIS — E1122 Type 2 diabetes mellitus with diabetic chronic kidney disease: Secondary | ICD-10-CM | POA: Diagnosis not present

## 2024-05-02 DIAGNOSIS — I129 Hypertensive chronic kidney disease with stage 1 through stage 4 chronic kidney disease, or unspecified chronic kidney disease: Secondary | ICD-10-CM | POA: Diagnosis not present

## 2024-05-02 DIAGNOSIS — N1831 Chronic kidney disease, stage 3a: Secondary | ICD-10-CM | POA: Diagnosis not present

## 2024-05-02 DIAGNOSIS — E039 Hypothyroidism, unspecified: Secondary | ICD-10-CM | POA: Diagnosis not present

## 2024-05-02 DIAGNOSIS — E7849 Other hyperlipidemia: Secondary | ICD-10-CM | POA: Diagnosis not present

## 2024-05-06 NOTE — Progress Notes (Deleted)
 Office Visit Note  Patient: Veronica Jordan             Date of Birth: 03/16/1946           MRN: [id number]             PCP: Loreli Elsie JONETTA Mickey., MD Referring: Loreli Elsie JONETTA Mickey., MD Visit Date: 05/20/2024 Occupation: Data Unavailable  Subjective:    History of Present Illness: Veronica Jordan is a 78 y.o. female with history of systemic lupus.  Patient remains on plaquenil  200 mg 1 tablet by mouth daily.  PLQ Eye Exam: 06/12/2023 WNL @ [facility] Follow up in 4-6 months.  CBC and CMP updated on 12/17/23.  Orders for CBC and CMP released today.    Activities of Daily Living:  Patient reports morning stiffness for *** {minute/hour:19697}.   Patient {ACTIONS;DENIES/REPORTS:21021675::Denies} nocturnal pain.  Difficulty dressing/grooming: {ACTIONS;DENIES/REPORTS:21021675::Denies} Difficulty climbing stairs: {ACTIONS;DENIES/REPORTS:21021675::Denies} Difficulty getting out of chair: {ACTIONS;DENIES/REPORTS:21021675::Denies} Difficulty using hands for taps, buttons, cutlery, and/or writing: {ACTIONS;DENIES/REPORTS:21021675::Denies}  No Rheumatology ROS completed.   PMFS History:  Patient Active Problem List   Diagnosis Date Noted   Sjogren's syndrome with keratoconjunctivitis sicca 08/07/2018   History of nephritis 08/07/2018   History of optic neuritis 08/07/2018   Fibromyalgia 08/07/2018   Primary osteoarthritis of both hands 08/07/2018   Primary osteoarthritis of both knees 08/07/2018   DDD (degenerative disc disease), lumbar 08/07/2018   Eosinophilic esophagitis 08/07/2018   History of IBS 08/07/2018   History of gastroesophageal reflux (GERD) 08/07/2018   History of diverticulosis 08/07/2018   History of type 2 diabetes mellitus 08/07/2018   History of kidney stones 08/07/2018   History of peripheral neuropathy 08/07/2018   Dyslipidemia 08/07/2018   Anxiety and depression 08/07/2018   Essential tremor 08/07/2018   Postmenopausal hormone replacement  therapy 03/05/2015   Osteoporosis 03/05/2015   Atrophic vaginitis 03/05/2015   HYPERTHYROIDISM 12/13/2007   Hypercholesterolemia 12/13/2007   Essential hypertension 12/13/2007   ALLERGIC RHINITIS 12/13/2007   LUPUS ERYTHEMATOSUS 12/13/2007   DYSPNEA 12/13/2007    Past Medical History:  Diagnosis Date   Allergic rhinitis    Anemia    Anxiety and depression    Depression    Diabetes mellitus    Diverticulitis    Fibromyalgia    GERD (gastroesophageal reflux disease)    History of gallstones    History of kidney stones    Hyperlipemia    Hypertension    Hypothyroidism    IBS (irritable bowel syndrome)    Osteoarthritis    Osteoporosis    Renal calculi    Sjogren's syndrome    Systemic lupus (HCC) 2001    Family History  Problem Relation Age of Onset   Asthma Mother    COPD Mother    Heart disease Father    Asthma Sister    Hypertension Sister    Hypertension Brother    Heart attack Brother    Arthritis Maternal Grandmother    Heart disease Maternal Grandfather    Lupus Paternal Grandmother    Heart attack Paternal Grandfather    Chronic fatigue Daughter    Parkinsonism Other        MGM   Colon cancer Neg Hx    Esophageal cancer Neg Hx    Stomach cancer Neg Hx    Past Surgical History:  Procedure Laterality Date   APPENDECTOMY  1970   BREAST BIOPSY Left 08/2020   BREAST BIOPSY Left 01/26/2023   MM LT BREAST BX  W LOC DEV 1ST LESION IMAGE BX SPEC STEREO GUIDE 01/26/2023 [facility]   BREAST EXCISIONAL BIOPSY Left    CATARACT EXTRACTION     bilateral   CESAREAN SECTION  1976, 1977   x2   CHOLECYSTECTOMY  1988   TONSILLECTOMY AND ADENOIDECTOMY     TOTAL ABDOMINAL HYSTERECTOMY  2000   BSO due to fibroids   Social History   Tobacco Use   Smoking status: Never    Passive exposure: Never   Smokeless tobacco: Never  Vaping Use   Vaping status: Never Used  Substance Use Topics   Alcohol  use: Yes    Comment: rarely wine   Drug use: No   Social  History   Social History Narrative   Youngest daughter died in MVA.   Right handed    Lives in a two story home that has a basement     Immunization History  Administered Date(s) Administered   PFIZER(Purple Top)SARS-COV-2 Vaccination 08/29/2019, 09/26/2019     Objective: Vital Signs: LMP 07/03/1998 (Approximate)    Physical Exam Vitals and nursing note reviewed.  Constitutional:      Appearance: She is well-developed.  HENT:     Head: Normocephalic and atraumatic.  Eyes:     Conjunctiva/sclera: Conjunctivae normal.  Cardiovascular:     Rate and Rhythm: Normal rate and regular rhythm.     Heart sounds: Normal heart sounds.  Pulmonary:     Effort: Pulmonary effort is normal.     Breath sounds: Normal breath sounds.  Abdominal:     General: Bowel sounds are normal.     Palpations: Abdomen is soft.  Musculoskeletal:     Cervical back: Normal range of motion.  Lymphadenopathy:     Cervical: No cervical adenopathy.  Skin:    General: Skin is warm and dry.     Capillary Refill: Capillary refill takes less than 2 seconds.  Neurological:     Mental Status: She is alert and oriented to person, place, and time.  Psychiatric:        Behavior: Behavior normal.      Musculoskeletal Exam: ***  CDAI Exam: CDAI Score: -- Patient Global: --; Provider Global: -- Swollen: --; Tender: -- Joint Exam 05/20/2024   No joint exam has been documented for this visit   There is currently no information documented on the homunculus. Go to the Rheumatology activity and complete the homunculus joint exam.  Investigation: No additional findings.  Imaging: No results found.  Recent Labs: Lab Results  Component Value Date   WBC 6.9 12/17/2023   HGB 12.0 12/17/2023   PLT 236 12/17/2023   NA 137 12/17/2023   K 4.2 12/17/2023   CL 108 12/17/2023   CO2 20 12/17/2023   GLUCOSE 130 (H) 12/17/2023   BUN 41 (H) 12/17/2023   CREATININE 0.71 12/17/2023   BILITOT 0.3 12/17/2023    ALKPHOS 79 06/28/2023   AST 20 12/17/2023   ALT 26 12/17/2023   PROT 7.1 12/17/2023   PROT 7.2 12/17/2023   ALBUMIN 4.2 06/28/2023   CALCIUM 9.5 12/17/2023   GFRAA 58 (L) 08/06/2020    Speciality Comments: PLQ Eye Exam: 06/12/2023 WNL @ [facility] Follow up in 4-6 months.  Procedures:  No procedures performed Allergies: Clindamycin/lincomycin, Codeine, Demerol [meperidine], Erythromycin, Iodinated contrast media, Meperidine hcl, Ondansetron hcl, Penicillins, Sulfonamide derivatives, and Zofran [ondansetron hcl]   Assessment / Plan:     Visit Diagnoses: Other systemic lupus erythematosus with other organ involvement (HCC)  High risk medication use  History of optic neuritis  History of nephritis  Sjogren's syndrome with keratoconjunctivitis sicca  Primary osteoarthritis of both hands  Primary osteoarthritis of both knees  Degeneration of intervertebral disc of lumbar region without discogenic back pain or lower extremity pain  Age-related osteoporosis without current pathological fracture  Vitamin D  deficiency  Fibromyalgia  Dyslipidemia  History of IBS  Anxiety and depression  History of type 2 diabetes mellitus  Eosinophilic esophagitis  History of kidney stones  Essential hypertension  History of gastroesophageal reflux (GERD)  History of diverticulosis  History of Parkinson's disease  Orders: No orders of the defined types were placed in this encounter.  No orders of the defined types were placed in this encounter.   Face-to-face time spent with patient was *** minutes. Greater than 50% of time was spent in counseling and coordination of care.  Follow-Up Instructions: No follow-ups on file.   Waddell CHRISTELLA Craze, PA-C  Note - This record has been created using Dragon software.  Chart creation errors have been sought, but may not always  have been located. Such creation errors do not reflect on  the standard of medical care.

## 2024-05-09 DIAGNOSIS — F339 Major depressive disorder, recurrent, unspecified: Secondary | ICD-10-CM | POA: Diagnosis not present

## 2024-05-09 DIAGNOSIS — K838 Other specified diseases of biliary tract: Secondary | ICD-10-CM | POA: Diagnosis not present

## 2024-05-09 DIAGNOSIS — G20A1 Parkinson's disease without dyskinesia, without mention of fluctuations: Secondary | ICD-10-CM | POA: Diagnosis not present

## 2024-05-09 DIAGNOSIS — I10 Essential (primary) hypertension: Secondary | ICD-10-CM | POA: Diagnosis not present

## 2024-05-09 DIAGNOSIS — E1122 Type 2 diabetes mellitus with diabetic chronic kidney disease: Secondary | ICD-10-CM | POA: Diagnosis not present

## 2024-05-09 DIAGNOSIS — R82998 Other abnormal findings in urine: Secondary | ICD-10-CM | POA: Diagnosis not present

## 2024-05-09 DIAGNOSIS — M545 Low back pain, unspecified: Secondary | ICD-10-CM | POA: Diagnosis not present

## 2024-05-09 DIAGNOSIS — I4581 Long QT syndrome: Secondary | ICD-10-CM | POA: Diagnosis not present

## 2024-05-09 DIAGNOSIS — Z0000 Encounter for general adult medical examination without abnormal findings: Secondary | ICD-10-CM | POA: Diagnosis not present

## 2024-05-09 DIAGNOSIS — Z1331 Encounter for screening for depression: Secondary | ICD-10-CM | POA: Diagnosis not present

## 2024-05-09 DIAGNOSIS — E1149 Type 2 diabetes mellitus with other diabetic neurological complication: Secondary | ICD-10-CM | POA: Diagnosis not present

## 2024-05-09 DIAGNOSIS — Z23 Encounter for immunization: Secondary | ICD-10-CM | POA: Diagnosis not present

## 2024-05-09 DIAGNOSIS — I129 Hypertensive chronic kidney disease with stage 1 through stage 4 chronic kidney disease, or unspecified chronic kidney disease: Secondary | ICD-10-CM | POA: Diagnosis not present

## 2024-05-09 DIAGNOSIS — F411 Generalized anxiety disorder: Secondary | ICD-10-CM | POA: Diagnosis not present

## 2024-05-09 DIAGNOSIS — E785 Hyperlipidemia, unspecified: Secondary | ICD-10-CM | POA: Diagnosis not present

## 2024-05-09 DIAGNOSIS — Z1339 Encounter for screening examination for other mental health and behavioral disorders: Secondary | ICD-10-CM | POA: Diagnosis not present

## 2024-05-09 DIAGNOSIS — E039 Hypothyroidism, unspecified: Secondary | ICD-10-CM | POA: Diagnosis not present

## 2024-05-09 DIAGNOSIS — M3210 Systemic lupus erythematosus, organ or system involvement unspecified: Secondary | ICD-10-CM | POA: Diagnosis not present

## 2024-05-09 DIAGNOSIS — N1831 Chronic kidney disease, stage 3a: Secondary | ICD-10-CM | POA: Diagnosis not present

## 2024-05-17 ENCOUNTER — Other Ambulatory Visit: Payer: Self-pay | Admitting: Physician Assistant

## 2024-05-17 DIAGNOSIS — M3219 Other organ or system involvement in systemic lupus erythematosus: Secondary | ICD-10-CM

## 2024-05-19 NOTE — Telephone Encounter (Signed)
 Last Fill: 03/06/2024  Eye exam: 06/12/2023  WNL  Labs: 12/17/2023 CBC WNL Glucose is 130. Rest of CMP stable.  ESR WNL Complements WNL Urine protein creatinine ratio WNL  dsDNA remains positive-16. We will continue to monitor lab work closely. ANA remains positive--titer and pattern unchanged.  SPEP normal.   Next Visit: 05/20/2024  Last Visit: 12/17/2023  DX: Other systemic lupus erythematosus with other organ involvement (HCC)   Current Dose per office note 12/17/2023: Plaquenil  200 mg 1 tablet by mouth daily.   Patient to update labs at upcomming appointment.   Okay to refill Plaquenil ?

## 2024-05-20 ENCOUNTER — Ambulatory Visit: Admitting: Physician Assistant

## 2024-05-20 DIAGNOSIS — Z8669 Personal history of other diseases of the nervous system and sense organs: Secondary | ICD-10-CM

## 2024-05-20 DIAGNOSIS — F32A Depression, unspecified: Secondary | ICD-10-CM

## 2024-05-20 DIAGNOSIS — M3219 Other organ or system involvement in systemic lupus erythematosus: Secondary | ICD-10-CM

## 2024-05-20 DIAGNOSIS — Z8719 Personal history of other diseases of the digestive system: Secondary | ICD-10-CM

## 2024-05-20 DIAGNOSIS — E785 Hyperlipidemia, unspecified: Secondary | ICD-10-CM

## 2024-05-20 DIAGNOSIS — M797 Fibromyalgia: Secondary | ICD-10-CM

## 2024-05-20 DIAGNOSIS — I10 Essential (primary) hypertension: Secondary | ICD-10-CM

## 2024-05-20 DIAGNOSIS — M170 Bilateral primary osteoarthritis of knee: Secondary | ICD-10-CM

## 2024-05-20 DIAGNOSIS — K200 Eosinophilic esophagitis: Secondary | ICD-10-CM

## 2024-05-20 DIAGNOSIS — M19041 Primary osteoarthritis, right hand: Secondary | ICD-10-CM

## 2024-05-20 DIAGNOSIS — E559 Vitamin D deficiency, unspecified: Secondary | ICD-10-CM

## 2024-05-20 DIAGNOSIS — Z87448 Personal history of other diseases of urinary system: Secondary | ICD-10-CM

## 2024-05-20 DIAGNOSIS — M3501 Sicca syndrome with keratoconjunctivitis: Secondary | ICD-10-CM

## 2024-05-20 DIAGNOSIS — Z87442 Personal history of urinary calculi: Secondary | ICD-10-CM

## 2024-05-20 DIAGNOSIS — M810 Age-related osteoporosis without current pathological fracture: Secondary | ICD-10-CM

## 2024-05-20 DIAGNOSIS — M51369 Other intervertebral disc degeneration, lumbar region without mention of lumbar back pain or lower extremity pain: Secondary | ICD-10-CM

## 2024-05-20 DIAGNOSIS — Z79899 Other long term (current) drug therapy: Secondary | ICD-10-CM

## 2024-05-20 DIAGNOSIS — Z8639 Personal history of other endocrine, nutritional and metabolic disease: Secondary | ICD-10-CM

## 2024-06-04 ENCOUNTER — Encounter: Payer: Self-pay | Admitting: Physician Assistant

## 2024-06-04 ENCOUNTER — Ambulatory Visit: Attending: Physician Assistant | Admitting: Physician Assistant

## 2024-06-04 VITALS — BP 121/72 | HR 73 | Temp 97.8°F | Resp 18 | Ht 59.0 in | Wt 113.6 lb

## 2024-06-04 DIAGNOSIS — Z87448 Personal history of other diseases of urinary system: Secondary | ICD-10-CM | POA: Insufficient documentation

## 2024-06-04 DIAGNOSIS — Z8669 Personal history of other diseases of the nervous system and sense organs: Secondary | ICD-10-CM | POA: Diagnosis not present

## 2024-06-04 DIAGNOSIS — I10 Essential (primary) hypertension: Secondary | ICD-10-CM | POA: Diagnosis not present

## 2024-06-04 DIAGNOSIS — M810 Age-related osteoporosis without current pathological fracture: Secondary | ICD-10-CM | POA: Diagnosis not present

## 2024-06-04 DIAGNOSIS — Z87442 Personal history of urinary calculi: Secondary | ICD-10-CM | POA: Diagnosis not present

## 2024-06-04 DIAGNOSIS — M3219 Other organ or system involvement in systemic lupus erythematosus: Secondary | ICD-10-CM | POA: Diagnosis not present

## 2024-06-04 DIAGNOSIS — F32A Depression, unspecified: Secondary | ICD-10-CM | POA: Diagnosis not present

## 2024-06-04 DIAGNOSIS — Z79899 Other long term (current) drug therapy: Secondary | ICD-10-CM | POA: Insufficient documentation

## 2024-06-04 DIAGNOSIS — M19041 Primary osteoarthritis, right hand: Secondary | ICD-10-CM | POA: Diagnosis not present

## 2024-06-04 DIAGNOSIS — M797 Fibromyalgia: Secondary | ICD-10-CM | POA: Insufficient documentation

## 2024-06-04 DIAGNOSIS — Z8719 Personal history of other diseases of the digestive system: Secondary | ICD-10-CM | POA: Diagnosis not present

## 2024-06-04 DIAGNOSIS — K200 Eosinophilic esophagitis: Secondary | ICD-10-CM | POA: Insufficient documentation

## 2024-06-04 DIAGNOSIS — M170 Bilateral primary osteoarthritis of knee: Secondary | ICD-10-CM | POA: Diagnosis not present

## 2024-06-04 DIAGNOSIS — E559 Vitamin D deficiency, unspecified: Secondary | ICD-10-CM | POA: Insufficient documentation

## 2024-06-04 DIAGNOSIS — M19042 Primary osteoarthritis, left hand: Secondary | ICD-10-CM | POA: Insufficient documentation

## 2024-06-04 DIAGNOSIS — F419 Anxiety disorder, unspecified: Secondary | ICD-10-CM | POA: Insufficient documentation

## 2024-06-04 DIAGNOSIS — M3501 Sicca syndrome with keratoconjunctivitis: Secondary | ICD-10-CM | POA: Insufficient documentation

## 2024-06-04 DIAGNOSIS — M51369 Other intervertebral disc degeneration, lumbar region without mention of lumbar back pain or lower extremity pain: Secondary | ICD-10-CM | POA: Diagnosis not present

## 2024-06-04 DIAGNOSIS — Z8639 Personal history of other endocrine, nutritional and metabolic disease: Secondary | ICD-10-CM | POA: Insufficient documentation

## 2024-06-04 DIAGNOSIS — E785 Hyperlipidemia, unspecified: Secondary | ICD-10-CM | POA: Diagnosis not present

## 2024-06-04 NOTE — Progress Notes (Signed)
 Office Visit Note  Patient: Veronica Jordan             Date of Birth: 1945-11-16           MRN: [id number]             PCP: Loreli Elsie JONETTA Mickey., MD Referring: Loreli Elsie JONETTA Mickey., MD Visit Date: 06/04/2024 Occupation: Data Unavailable  Subjective:  Medication monitoring   History of Present Illness: Veronica Jordan is a 78 y.o. female with history of systemic lupus.  Patient remains on plaquenil  200 mg 1 tablet by mouth daily.  She tolerating Plaquenil  without any side effects and has not had any gaps in therapy.  She denies any signs or symptoms of a systemic lupus flare.  She continues to have chronic sicca symptoms.  She denies any current oral or nasal ulcerations.  She denies any recent facial rashes.  Patient states that she is scheduled for an eye examination later this month or early January. She denies any increased joint pain or joint swelling at this time.  Patient states that she recently took a course of antibiotics for an upper respiratory tract infection.  She continues to have coughing intermittently. She denies any new medical conditions.   Activities of Daily Living:  Patient reports morning stiffness for 20 minutes.   Patient Reports nocturnal pain.  Difficulty dressing/grooming: Denies Difficulty climbing stairs: Reports Difficulty getting out of chair: Reports Difficulty using hands for taps, buttons, cutlery, and/or writing: Reports  Review of Systems  Constitutional:  Positive for fatigue.  HENT:  Positive for mouth dryness. Negative for mouth sores.   Eyes:  Positive for dryness.  Respiratory:  Positive for cough and shortness of breath.   Cardiovascular:  Negative for chest pain and palpitations.  Gastrointestinal:  Negative for blood in stool, constipation and diarrhea.  Endocrine: Negative for increased urination.  Genitourinary:  Negative for involuntary urination.  Musculoskeletal:  Positive for myalgias, muscle weakness, morning stiffness, muscle  tenderness and myalgias. Negative for joint pain, gait problem, joint pain and joint swelling.  Skin:  Negative for color change, rash, hair loss and sensitivity to sunlight.  Allergic/Immunologic: Positive for susceptible to infections.  Neurological:  Positive for dizziness. Negative for headaches.  Hematological:  Negative for swollen glands.  Psychiatric/Behavioral:  Positive for depressed mood and sleep disturbance. The patient is nervous/anxious.     PMFS History:  Patient Active Problem List   Diagnosis Date Noted   Sjogren's syndrome with keratoconjunctivitis sicca 08/07/2018   History of nephritis 08/07/2018   History of optic neuritis 08/07/2018   Fibromyalgia 08/07/2018   Primary osteoarthritis of both hands 08/07/2018   Primary osteoarthritis of both knees 08/07/2018   DDD (degenerative disc disease), lumbar 08/07/2018   Eosinophilic esophagitis 08/07/2018   History of IBS 08/07/2018   History of gastroesophageal reflux (GERD) 08/07/2018   History of diverticulosis 08/07/2018   History of type 2 diabetes mellitus 08/07/2018   History of kidney stones 08/07/2018   History of peripheral neuropathy 08/07/2018   Dyslipidemia 08/07/2018   Anxiety and depression 08/07/2018   Essential tremor 08/07/2018   Postmenopausal hormone replacement therapy 03/05/2015   Osteoporosis 03/05/2015   Atrophic vaginitis 03/05/2015   HYPERTHYROIDISM 12/13/2007   Hypercholesterolemia 12/13/2007   Essential hypertension 12/13/2007   ALLERGIC RHINITIS 12/13/2007   LUPUS ERYTHEMATOSUS 12/13/2007   DYSPNEA 12/13/2007    Past Medical History:  Diagnosis Date   Allergic rhinitis    Anemia    Anxiety and  depression    Depression    Diabetes mellitus    Diverticulitis    Fibromyalgia    GERD (gastroesophageal reflux disease)    History of gallstones    History of kidney stones    Hyperlipemia    Hypertension    Hypothyroidism    IBS (irritable bowel syndrome)    Osteoarthritis     Osteoporosis    Renal calculi    Sjogren's syndrome    Systemic lupus (HCC) 2001    Family History  Problem Relation Age of Onset   Asthma Mother    COPD Mother    Heart disease Father    Asthma Sister    Hypertension Sister    Hypertension Brother    Heart attack Brother    Arthritis Maternal Grandmother    Heart disease Maternal Grandfather    Lupus Paternal Grandmother    Heart attack Paternal Grandfather    Chronic fatigue Daughter    Parkinsonism Other        MGM   Colon cancer Neg Hx    Esophageal cancer Neg Hx    Stomach cancer Neg Hx    Past Surgical History:  Procedure Laterality Date   APPENDECTOMY  1970   BREAST BIOPSY Left 08/2020   BREAST BIOPSY Left 01/26/2023   MM LT BREAST BX W LOC DEV 1ST LESION IMAGE BX SPEC STEREO GUIDE 01/26/2023 [facility]   BREAST EXCISIONAL BIOPSY Left    CATARACT EXTRACTION     bilateral   CESAREAN SECTION  1976, 1977   x2   CHOLECYSTECTOMY  1988   TONSILLECTOMY AND ADENOIDECTOMY     TOTAL ABDOMINAL HYSTERECTOMY  2000   BSO due to fibroids   Social History   Tobacco Use   Smoking status: Never    Passive exposure: Never   Smokeless tobacco: Never  Vaping Use   Vaping status: Never Used  Substance Use Topics   Alcohol  use: Yes    Comment: rarely wine   Drug use: No   Social History   Social History Narrative   Youngest daughter died in MVA.   Right handed    Lives in a two story home that has a basement     Immunization History  Administered Date(s) Administered   PFIZER(Purple Top)SARS-COV-2 Vaccination 08/29/2019, 09/26/2019     Objective: Vital Signs: BP 121/72   Pulse 73   Temp 97.8 F (36.6 C)   Resp 18   Ht 4' 11 (1.499 m)   Wt 113 lb 9.6 oz (51.5 kg)   LMP 07/03/1998 (Approximate)   BMI 22.94 kg/m    Physical Exam Vitals and nursing note reviewed.  Constitutional:      Appearance: She is well-developed.  HENT:     Head: Normocephalic and atraumatic.  Eyes:      Conjunctiva/sclera: Conjunctivae normal.  Cardiovascular:     Rate and Rhythm: Normal rate and regular rhythm.     Heart sounds: Normal heart sounds.  Pulmonary:     Effort: Pulmonary effort is normal.     Breath sounds: Normal breath sounds.  Abdominal:     General: Bowel sounds are normal.     Palpations: Abdomen is soft.  Musculoskeletal:     Cervical back: Normal range of motion.  Lymphadenopathy:     Cervical: No cervical adenopathy.  Skin:    General: Skin is warm and dry.     Capillary Refill: Capillary refill takes less than 2 seconds.  Neurological:  Mental Status: She is alert and oriented to person, place, and time.  Psychiatric:        Behavior: Behavior normal.      Musculoskeletal Exam: C-spine has limited ROM with lateral rotation.  Limited mobility of the lumbar spine with discomfort. Shoulder joints, elbow joints, wrist joints, MCPs, PIPs, DIPs have good range of motion with no synovitis. PIP and DIP thickening consistent with osteoarthritis of both hands.  Complete fist formation bilaterally.  Hip joints have good range of motion with no groin pain.  Knee joints have good range of motion no warmth or effusion.  Ankle joints have good range of motion no tenderness or joint swelling.   CDAI Exam: CDAI Score: -- Patient Global: --; Provider Global: -- Swollen: --; Tender: -- Joint Exam 06/04/2024   No joint exam has been documented for this visit   There is currently no information documented on the homunculus. Go to the Rheumatology activity and complete the homunculus joint exam.  Investigation: No additional findings.  Imaging: No results found.  Recent Labs: Lab Results  Component Value Date   WBC 6.9 12/17/2023   HGB 12.0 12/17/2023   PLT 236 12/17/2023   NA 137 12/17/2023   K 4.2 12/17/2023   CL 108 12/17/2023   CO2 20 12/17/2023   GLUCOSE 130 (H) 12/17/2023   BUN 41 (H) 12/17/2023   CREATININE 0.71 12/17/2023   BILITOT 0.3 12/17/2023    ALKPHOS 79 06/28/2023   AST 20 12/17/2023   ALT 26 12/17/2023   PROT 7.1 12/17/2023   PROT 7.2 12/17/2023   ALBUMIN 4.2 06/28/2023   CALCIUM 9.5 12/17/2023   GFRAA 58 (L) 08/06/2020    Speciality Comments: PLQ Eye Exam: 06/12/2023 WNL @ [facility] Follow up in 4-6 months.  Procedures:  No procedures performed Allergies: Clindamycin/lincomycin, Codeine, Demerol [meperidine], Erythromycin, Iodinated contrast media, Meperidine hcl, Ondansetron hcl, Penicillins, Sulfonamide derivatives, and Zofran [ondansetron hcl]   Assessment / Plan:     Visit Diagnoses: Other systemic lupus erythematosus with other organ involvement (HCC) - Positive ANA, positive Ro, positive La, positive RF, elevated ESR, history of arthritis optic neuritis and nephritis:  She has not had any signs or symptoms of a systemic lupus flare.  She has clinically been doing well taking plaquenil  200 mg 1 tablet by mouth daily.  She is tolerating Plaquenil  without any side effects and has not had any gaps in therapy.  Patient continues to have chronic sicca symptoms and discussed the use of over-the-counter products.  No oral or nasal ulcerations currently.  No Malar rash noted.  No synovitis noted on examination today. Lab work from 12/17/2023 was reviewed today in the office: ANA remains positive, SPEP normal, double-stranded DNA 16, ESR within normal limits, complements within normal limits. Plan to obtain the following lab work today for further evaluation.  She will remain on Plaquenil  as prescribed.  She is vies notify us  if she develops signs or symptoms of a flare.  She will follow-up in the office in 5 months or sooner if needed.- Plan: Protein / creatinine ratio, urine, CBC with Differential/Platelet, Sedimentation rate, C3 and C4, Comprehensive metabolic panel with GFR, Anti-DNA antibody, double-stranded, Serum protein electrophoresis with reflex  High risk medication use - Plaquenil  200 mg 1 tablet by mouth daily.   PLQ Eye Exam: 06/12/2023 WNL @ [facility]--scheduled in December 2025/January 2026.   CBC and CMP updated today.  - Plan: CBC with Differential/Platelet, Comprehensive metabolic panel with GFR  History of optic neuritis - Followed by Dr. Kennyth with [facility].  History of nephritis: Followed by nephrology: No proteinuria noted on 07/19/2023.   Sjogren's syndrome with keratoconjunctivitis sicca: ANA+, Ro+, La+, positive RF: Chronic sicca symptoms.  Not currently using any OTC products. She has been trying to drink fluids throughout the day.  No signs of inflammatory arthritis at this time.  No new rashes.  She is clinically doing well taking Plaquenil  200 mg 1 tablet by mouth daily. Plan check SPEP today and ESR.  Primary osteoarthritis of both hands: She has PIP and DIP thickening consistent with osteoarthritis of both hands.  No synovitis noted.  Primary osteoarthritis of both knees:  She has good ROM of both knees with no warmth or effusion.   Degeneration of intervertebral disc of lumbar region without discogenic back pain or lower extremity pain: Patient continues to have chronic lower back pain and limited mobility.  Age-related osteoporosis without current pathological fracture: DEXA ordered by PCP. DEXA normal on 05/26/14--DEXA in epic to review. Patient is taking calcium and vitamin D  supplement daily.   Vitamin D  deficiency: She is taking a daily calcium and vitamin D  supplement.   Fibromyalgia: She continues to have intermittent myalgias and muscle tenderness.    Other medical conditions are listed as follows:   Dyslipidemia  History of IBS  Anxiety and depression  History of type 2 diabetes mellitus  Eosinophilic esophagitis  History of kidney stones  Essential hypertension: BP was 121/72 today in the office.   History of gastroesophageal reflux (GERD)  History of diverticulosis  History of Parkinson's disease  Orders: Orders Placed This  Encounter  Procedures   Protein / creatinine ratio, urine   CBC with Differential/Platelet   Sedimentation rate   C3 and C4   Comprehensive metabolic panel with GFR   Anti-DNA antibody, double-stranded   Serum protein electrophoresis with reflex   No orders of the defined types were placed in this encounter.    Follow-Up Instructions: Return in about 5 months (around 11/02/2024) for Systemic lupus erythematosus.   Waddell CHRISTELLA Craze, PA-C  Note - This record has been created using Dragon software.  Chart creation errors have been sought, but may not always  have been located. Such creation errors do not reflect on  the standard of medical care.

## 2024-06-05 ENCOUNTER — Ambulatory Visit: Payer: Self-pay | Admitting: Physician Assistant

## 2024-06-05 ENCOUNTER — Other Ambulatory Visit: Payer: Self-pay

## 2024-06-05 DIAGNOSIS — M3219 Other organ or system involvement in systemic lupus erythematosus: Secondary | ICD-10-CM

## 2024-06-05 NOTE — Progress Notes (Signed)
 CBC WNL ESR WNL Glucose is 118, potassium is borderline low-3.4. BUN remains elevated but has improved.  Rest of CMP WNL  Urine protein creatinine ratio is slightly elevated-recommend rechecking in 2 weeks.

## 2024-06-05 NOTE — Progress Notes (Signed)
Complements WNL

## 2024-06-06 LAB — CBC WITH DIFFERENTIAL/PLATELET
Absolute Lymphocytes: 1376 {cells}/uL (ref 850–3900)
Absolute Monocytes: 673 {cells}/uL (ref 200–950)
Basophils Absolute: 59 {cells}/uL (ref 0–200)
Basophils Relative: 0.8 %
Eosinophils Absolute: 141 {cells}/uL (ref 15–500)
Eosinophils Relative: 1.9 %
HCT: 37 % (ref 35.9–46.0)
Hemoglobin: 11.7 g/dL (ref 11.7–15.5)
MCH: 29.9 pg (ref 27.0–33.0)
MCHC: 31.6 g/dL (ref 31.6–35.4)
MCV: 94.6 fL (ref 81.4–101.7)
MPV: 11.3 fL (ref 7.5–12.5)
Monocytes Relative: 9.1 %
Neutro Abs: 5150 {cells}/uL (ref 1500–7800)
Neutrophils Relative %: 69.6 %
Platelets: 184 Thousand/uL (ref 140–400)
RBC: 3.91 Million/uL (ref 3.80–5.10)
RDW: 11.6 % (ref 11.0–15.0)
Total Lymphocyte: 18.6 %
WBC: 7.4 Thousand/uL (ref 3.8–10.8)

## 2024-06-06 LAB — C3 AND C4
C3 Complement: 110 mg/dL (ref 83–193)
C4 Complement: 15 mg/dL (ref 15–57)

## 2024-06-06 LAB — PROTEIN / CREATININE RATIO, URINE
Creatinine, Urine: 115 mg/dL (ref 20–275)
Protein/Creat Ratio: 191 mg/g{creat} — ABNORMAL HIGH (ref 24–184)
Protein/Creatinine Ratio: 0.191 mg/mg{creat} — ABNORMAL HIGH (ref 0.024–0.184)
Total Protein, Urine: 22 mg/dL (ref 5–24)

## 2024-06-06 LAB — COMPREHENSIVE METABOLIC PANEL WITH GFR
AG Ratio: 1.6 (calc) (ref 1.0–2.5)
ALT: 11 U/L (ref 6–29)
AST: 18 U/L (ref 10–35)
Albumin: 4.1 g/dL (ref 3.6–5.1)
Alkaline phosphatase (APISO): 86 U/L (ref 37–153)
BUN/Creatinine Ratio: 46 (calc) — ABNORMAL HIGH (ref 6–22)
BUN: 39 mg/dL — ABNORMAL HIGH (ref 7–25)
CO2: 21 mmol/L (ref 20–32)
Calcium: 9.1 mg/dL (ref 8.6–10.4)
Chloride: 108 mmol/L (ref 98–110)
Creat: 0.84 mg/dL (ref 0.60–1.00)
Globulin: 2.6 g/dL (ref 1.9–3.7)
Glucose, Bld: 118 mg/dL — ABNORMAL HIGH (ref 65–99)
Potassium: 3.4 mmol/L — ABNORMAL LOW (ref 3.5–5.3)
Sodium: 140 mmol/L (ref 135–146)
Total Bilirubin: 0.3 mg/dL (ref 0.2–1.2)
Total Protein: 6.7 g/dL (ref 6.1–8.1)
eGFR: 72 mL/min/1.73m2 (ref >=60)

## 2024-06-06 LAB — PROTEIN ELECTROPHORESIS, SERUM, WITH REFLEX
Albumin ELP: 3.9 g/dL (ref 3.8–4.8)
Alpha 1: 0.3 g/dL (ref 0.2–0.3)
Alpha 2: 0.8 g/dL (ref 0.5–0.9)
Beta 2: 0.3 g/dL (ref 0.2–0.5)
Beta Globulin: 0.4 g/dL (ref 0.4–0.6)
Gamma Globulin: 1 g/dL (ref 0.8–1.7)
Total Protein: 6.7 g/dL (ref 6.1–8.1)

## 2024-06-06 LAB — ANTI-DNA ANTIBODY, DOUBLE-STRANDED: ds DNA Ab: 15 [IU]/mL — ABNORMAL HIGH

## 2024-06-06 LAB — SEDIMENTATION RATE: Sed Rate: 11 mm/h (ref 0–30)

## 2024-06-06 NOTE — Progress Notes (Signed)
 Urine protein creatinine ratio is mildly elevated.  Please repeat urine protein creatinine ratio in 1 month.  Glucose is mildly elevated.  Potassium is low at 3.4.  Anti-DNA is positive and stable.  CBC normal, sed rate normal, complements normal.  SPEP pending.  Labs do not indicate an autoimmune disease flare.  Please forward results to her PCP.

## 2024-06-08 DIAGNOSIS — G20A1 Parkinson's disease without dyskinesia, without mention of fluctuations: Secondary | ICD-10-CM | POA: Diagnosis not present

## 2024-06-08 DIAGNOSIS — J189 Pneumonia, unspecified organism: Secondary | ICD-10-CM | POA: Diagnosis not present

## 2024-06-09 NOTE — Progress Notes (Signed)
SPEP did not reveal any abnormal protein bands.

## 2024-10-21 ENCOUNTER — Ambulatory Visit: Admitting: Neurology

## 2024-11-05 ENCOUNTER — Ambulatory Visit: Admitting: Rheumatology
